# Patient Record
Sex: Female | Born: 1990 | Race: Black or African American | Hispanic: No | Marital: Married | Smoking: Former smoker
Health system: Southern US, Community
[De-identification: ages and names within clinical notes are randomized; demographics above are authoritative.]

## PROBLEM LIST (undated history)

## (undated) DIAGNOSIS — R51 Headache: Secondary | ICD-10-CM

## (undated) DIAGNOSIS — O24419 Gestational diabetes mellitus in pregnancy, unspecified control: Secondary | ICD-10-CM

## (undated) DIAGNOSIS — D6859 Other primary thrombophilia: Secondary | ICD-10-CM

## (undated) DIAGNOSIS — I639 Cerebral infarction, unspecified: Secondary | ICD-10-CM

## (undated) DIAGNOSIS — D649 Anemia, unspecified: Secondary | ICD-10-CM

## (undated) DIAGNOSIS — R7303 Prediabetes: Secondary | ICD-10-CM

## (undated) DIAGNOSIS — E785 Hyperlipidemia, unspecified: Secondary | ICD-10-CM

## (undated) DIAGNOSIS — R519 Headache, unspecified: Secondary | ICD-10-CM

## (undated) DIAGNOSIS — F419 Anxiety disorder, unspecified: Secondary | ICD-10-CM

## (undated) DIAGNOSIS — E669 Obesity, unspecified: Secondary | ICD-10-CM

## (undated) HISTORY — PX: OTHER SURGICAL HISTORY: SHX169

## (undated) HISTORY — DX: Prediabetes: R73.03

## (undated) HISTORY — DX: Gestational diabetes mellitus in pregnancy, unspecified control: O24.419

## (undated) HISTORY — DX: Anxiety disorder, unspecified: F41.9

## (undated) HISTORY — PX: TEE WITHOUT CARDIOVERSION: SHX5443

## (undated) HISTORY — DX: Other primary thrombophilia: D68.59

## (undated) HISTORY — DX: Hyperlipidemia, unspecified: E78.5

---

## 2013-03-03 ENCOUNTER — Encounter (HOSPITAL_COMMUNITY): Payer: Self-pay | Admitting: Emergency Medicine

## 2013-03-03 ENCOUNTER — Emergency Department (HOSPITAL_COMMUNITY)
Admission: EM | Admit: 2013-03-03 | Discharge: 2013-03-03 | Disposition: A | Discharge status: Home / Self Care | Payer: Medicaid Other | Attending: Emergency Medicine | Admitting: Emergency Medicine

## 2013-03-03 DIAGNOSIS — H53149 Visual discomfort, unspecified: Secondary | ICD-10-CM | POA: Insufficient documentation

## 2013-03-03 DIAGNOSIS — Z79899 Other long term (current) drug therapy: Secondary | ICD-10-CM | POA: Insufficient documentation

## 2013-03-03 DIAGNOSIS — R51 Headache: Secondary | ICD-10-CM | POA: Insufficient documentation

## 2013-03-03 MED ORDER — KETOROLAC TROMETHAMINE 30 MG/ML IJ SOLN
30.0000 mg | Freq: Once | INTRAMUSCULAR | Status: AC
  Administered 2013-03-03: 30 mg via INTRAMUSCULAR
  Filled 2013-03-03: qty 1

## 2013-03-03 NOTE — ED Provider Notes (Signed)
CSN: [id number]     Arrival date & time 03/03/13  5621 History   First MD Initiated Contact with Patient 03/03/13 [phone]     Chief Complaint  Patient presents with  . Headache   (Consider location/radiation/quality/duration/timing/severity/associated sxs/prior Treatment) HPI  This is a 22 year old female who presents with headache. Patient reports daily headache for the last 2 weeks. She has a history of allergies and has been taking Claritin, Tylenol, and Motrin without any relief. Patient states that her headache gradually worsens throughout the day. Currently her headache is one out of 10. She states that sometimes she gets blurry vision but no double vision. She has photosensitivity. She denies any nausea or vomiting.  Patient is not on birth control or hormonal medications.  She has a history of migraines but does have history of headaches with her allergies. She's never had a headache last this long. She denies any weakness or numbness.  History reviewed. No pertinent past medical history. History reviewed. No pertinent past surgical history. History reviewed. No pertinent family history. History  Substance Use Topics  . Smoking status: Never Smoker   . Smokeless tobacco: Never Used  . Alcohol Use: No   OB History   Grav Para Term Preterm Abortions TAB SAB Ect Mult Living                 Review of Systems  Constitutional: Negative for fever.  Eyes: Positive for photophobia.  Respiratory: Negative for cough, chest tightness and shortness of breath.   Cardiovascular: Negative for chest pain.  Gastrointestinal: Negative for nausea, vomiting and abdominal pain.  Genitourinary: Negative for dysuria.  Musculoskeletal: Negative for neck pain.  Skin: Negative for wound.  Neurological: Positive for headaches. Negative for dizziness, syncope, weakness and numbness.  Psychiatric/Behavioral: Negative for confusion.  All other systems reviewed and are negative.    Allergies  Review  of patient's allergies indicates no known allergies.  Home Medications   Current Outpatient Rx  Name  Route  Sig  Dispense  Refill  . ALPRAZolam (XANAX) 0.5 MG tablet   Oral   Take 0.5 mg by mouth daily as needed for anxiety.         Marland Kitchen loratadine (CLARITIN) 10 MG tablet   Oral   Take 10 mg by mouth daily.          BP 143/83  Pulse 88  Temp(Src) 97.9 F (36.6 C) (Oral)  Resp 18  Ht 5\' 7"  (1.702 m)  Wt 225 lb (102.059 kg)  BMI 35.23 kg/m2  SpO2 98%  LMP 02/18/2013 Physical Exam  Nursing note and vitals reviewed. Constitutional: She is oriented to person, place, and time. She appears well-developed and well-nourished. No distress.  HENT:  Head: Normocephalic and atraumatic.  Mouth/Throat: Oropharynx is clear and moist.  Eyes: EOM are normal. Pupils are equal, round, and reactive to light.  No evidence of papilledema on nondilated funduscopic exam  Neck: Neck supple.  Cardiovascular: Normal rate, regular rhythm and normal heart sounds.   No murmur heard. Pulmonary/Chest: Effort normal and breath sounds normal. No respiratory distress. She has no wheezes.  Abdominal: Soft. Bowel sounds are normal. There is no tenderness.  Musculoskeletal: She exhibits no edema.  Neurological: She is alert and oriented to person, place, and time. No cranial nerve deficit.  No dysmetria to finger-nose-finger, 4 out of 4 strength in all 4 extremities, visual fields intact  Skin: Skin is warm and dry.  Psychiatric: She has a normal mood and affect.  ED Course  Procedures (including critical care time) Labs Review Labs Reviewed - No data to display Imaging Review No results found.  MDM   1. Headache    This is a 22 year old female who presents with 2 weeks of headache. She is nontoxic-appearing on exam and her vital signs are reassuring. Patient reports 1/10 pain right now but states that usually is worse during the day. Visual acuity and funduscopic exam are reassuring. At this  time I do not feel she needs LP for pseudotumor evaluation. Patient was given IM Toradol with complete resolution of her symptoms. I discussed with the patient that this is likely a migraine variant. However I cannot fully rule out pseudotumor without an LP. She is to develop worsening or refractory headache, acute vision changes, or other concerning symptoms she needs to be reevaluated. Also discussed with her that I thought was unlikely that this was any intracranial abnormality including tumor or bleed. Patient stated understanding. His primary care physician she will followup with.  After history, exam, and medical workup I feel the patient has been appropriately medically screened and is safe for discharge home. Pertinent diagnoses were discussed with the patient. Patient was given return precautions.    Shon Baton, MD 03/03/13 1046

## 2013-03-03 NOTE — ED Notes (Addendum)
Pt states having severe headaches x2 wks. Pt reports have allergies and thought headaches were related but headaches have not improved with taking her normal dose of clariton and tylenol and motrin as needed. Pt states tingling to right arm, blurred vision, and light sensitivity. Pt denies any other symptoms at present time. Pt denies taking any medications today.

## 2014-03-05 ENCOUNTER — Emergency Department (HOSPITAL_COMMUNITY)
Admission: EM | Admit: 2014-03-05 | Discharge: 2014-03-05 | Disposition: A | Discharge status: Home / Self Care | Payer: Medicaid Other | Attending: Emergency Medicine | Admitting: Emergency Medicine

## 2014-03-05 ENCOUNTER — Encounter (HOSPITAL_COMMUNITY): Payer: Self-pay | Admitting: Emergency Medicine

## 2014-03-05 DIAGNOSIS — Z79899 Other long term (current) drug therapy: Secondary | ICD-10-CM | POA: Diagnosis not present

## 2014-03-05 DIAGNOSIS — R51 Headache: Secondary | ICD-10-CM | POA: Diagnosis present

## 2014-03-05 DIAGNOSIS — R110 Nausea: Secondary | ICD-10-CM | POA: Diagnosis not present

## 2014-03-05 DIAGNOSIS — D649 Anemia, unspecified: Secondary | ICD-10-CM | POA: Diagnosis not present

## 2014-03-05 DIAGNOSIS — E669 Obesity, unspecified: Secondary | ICD-10-CM | POA: Diagnosis not present

## 2014-03-05 DIAGNOSIS — R519 Headache, unspecified: Secondary | ICD-10-CM

## 2014-03-05 HISTORY — DX: Headache, unspecified: R51.9

## 2014-03-05 HISTORY — DX: Headache: R51

## 2014-03-05 HISTORY — DX: Obesity, unspecified: E66.9

## 2014-03-05 HISTORY — DX: Anemia, unspecified: D64.9

## 2014-03-05 MED ORDER — KETOROLAC TROMETHAMINE 30 MG/ML IJ SOLN
60.0000 mg | Freq: Once | INTRAMUSCULAR | Status: AC
  Administered 2014-03-05: 60 mg via INTRAMUSCULAR
  Filled 2014-03-05: qty 2

## 2014-03-05 MED ORDER — METOCLOPRAMIDE HCL 5 MG/ML IJ SOLN
10.0000 mg | Freq: Once | INTRAMUSCULAR | Status: AC
  Administered 2014-03-05: 10 mg via INTRAMUSCULAR
  Filled 2014-03-05: qty 2

## 2014-03-05 MED ORDER — DIPHENHYDRAMINE HCL 50 MG/ML IJ SOLN
25.0000 mg | Freq: Once | INTRAMUSCULAR | Status: AC
  Administered 2014-03-05: 25 mg via INTRAMUSCULAR
  Filled 2014-03-05: qty 1

## 2014-03-05 NOTE — Discharge Instructions (Signed)

## 2014-03-05 NOTE — ED Provider Notes (Signed)
Medical screening examination/treatment/procedure(s) were performed by non-physician practitioner and as supervising physician I was immediately available for consultation/collaboration.   EKG Interpretation None        Ward GivensIva L Titiana Severa, MD 03/05/14 1337

## 2014-03-05 NOTE — ED Provider Notes (Signed)
CSN: [id number]     Arrival date & time 03/05/14  1039 History   First MD Initiated Contact with Patient 03/05/14 1109     Chief Complaint  Patient presents with  . Headache    recurrent headache x 1 week  . Nausea     (Consider location/radiation/quality/duration/timing/severity/associated sxs/prior Treatment) HPI Comments: Pt states that she has been having intermittent headache for last week. She states that that she saw her pcp for the symptoms and was told to continue to take ibuprofen. Pt states that she was started on birthcontrol on 2 days ago for irregular bleeding and iron for anemia. Pt states that the ibuprofen helps for a short time and then it starts again. Does have some vision changes when the headache is intense. No vomiting, syncope, neck pain, numbness or weakness. Does have a history of similar symptoms she states that she usually gets injections and symptoms go away  The history is provided by the patient. No language interpreter was used.    Past Medical History  Diagnosis Date  . Anemia   . Headache   . Obesity    History reviewed. No pertinent past surgical history. No family history on file. History  Substance Use Topics  . Smoking status: Never Smoker   . Smokeless tobacco: Never Used  . Alcohol Use: No   OB History   Grav Para Term Preterm Abortions TAB SAB Ect Mult Living                 Review of Systems  Constitutional: Negative.   Respiratory: Negative.   Cardiovascular: Negative.       Allergies  Review of patient's allergies indicates no known allergies.  Home Medications   Prior to Admission medications   Medication Sig Start Date End Date Taking? Authorizing Provider  ferrous sulfate 325 (65 FE) MG tablet Take 325 mg by mouth daily with breakfast.   Yes Historical Provider, MD  ibuprofen (ADVIL,MOTRIN) 200 MG tablet Take 400 mg by mouth every 6 (six) hours as needed for headache.   Yes Historical Provider, MD   norgestimate-ethinyl estradiol (ORTHO-CYCLEN,SPRINTEC,PREVIFEM) 0.25-35 MG-MCG tablet Take 1 tablet by mouth daily.   Yes Historical Provider, MD   BP 133/68  Pulse 100  Temp(Src) 98.4 F (36.9 C) (Oral)  Resp 18  Wt 270 lb (122.471 kg)  SpO2 100%  LMP 02/06/2014 Physical Exam  Nursing note and vitals reviewed. Constitutional: She is oriented to person, place, and time. She appears well-developed and well-nourished.  HENT:  Head: Normocephalic and atraumatic.  Eyes: Conjunctivae and EOM are normal. Pupils are equal, round, and reactive to light.  Neck: Normal range of motion. Neck supple.  Cardiovascular: Normal rate and regular rhythm.   Pulmonary/Chest: Effort normal and breath sounds normal.  Musculoskeletal: Normal range of motion.  Neurological: She is alert and oriented to person, place, and time. She exhibits normal muscle tone. Coordination normal.  No abnormal finger to nose. Grip strength is normal. Ambulatory without any problem  Skin: Skin is warm.    ED Course  Procedures (including critical care time) Labs Review Labs Reviewed - No data to display  Imaging Review No results found.   EKG Interpretation None      MDM   Final diagnoses:  Headache, unspecified headache type    Pt is neurologically intact. Pt is feeling better after medications. Discussed follow up for further symptoms as she is having recurrent episodes and may benefit for chronic medications  Teressa LowerVrinda Irlanda Croghan, NP 03/05/14 1218

## 2014-03-05 NOTE — ED Notes (Signed)
Pt reports intermittent blurred vision, nausea and headache x 1 week. Pain decreased with Motrin. Seen by PCP on Friday. PCP  recommended a complete physical. Pt has been treated multiple times for similar episodes that respond to "injection"

## 2014-03-08 ENCOUNTER — Ambulatory Visit: Payer: Medicaid Other | Admitting: Neurology

## 2014-03-13 ENCOUNTER — Encounter: Payer: Self-pay | Admitting: Neurology

## 2014-03-13 ENCOUNTER — Ambulatory Visit (INDEPENDENT_AMBULATORY_CARE_PROVIDER_SITE_OTHER): Payer: Medicaid Other | Admitting: Neurology

## 2014-03-13 VITALS — BP 129/80 | HR 96 | Ht 68.0 in | Wt 273.8 lb

## 2014-03-13 DIAGNOSIS — G43909 Migraine, unspecified, not intractable, without status migrainosus: Secondary | ICD-10-CM | POA: Insufficient documentation

## 2014-03-13 DIAGNOSIS — G43709 Chronic migraine without aura, not intractable, without status migrainosus: Secondary | ICD-10-CM

## 2014-03-13 DIAGNOSIS — Q142 Congenital malformation of optic disc: Secondary | ICD-10-CM

## 2014-03-13 HISTORY — DX: Morbid (severe) obesity due to excess calories: E66.01

## 2014-03-13 HISTORY — DX: Migraine, unspecified, not intractable, without status migrainosus: G43.909

## 2014-03-13 MED ORDER — TOPIRAMATE ER 100 MG PO CAP24: 100.0000 mg | ORAL_CAPSULE | Freq: Every day | ORAL | Status: DC

## 2014-03-13 MED ORDER — ELETRIPTAN HYDROBROMIDE 40 MG PO TABS: 40.0000 mg | ORAL_TABLET | ORAL | Status: DC | PRN

## 2014-03-13 NOTE — Progress Notes (Signed)
[facility]    Provider:  Dr Lucia GaskinsAhern Referring Provider: No ref. provider found Primary Care Physician:  Altamese CarolinaMARTIN,[name], MD  CC:  Migraine  HPI:  Angel ShoreKhamilah [name] is a 23 y.o. female here as a referral from Dr. No ref. provider found for Migraine  Headaches started a year ago, went to the emergency room and was treated for migraines at that time. Progressing, more severe and more frequent. They are in the right side of the head and eye hurts 10/10 pain like someone is stabbing her and she feels like something is moving in her head. +nausea, light sensitivity, needs to go into a dark room. They last 15-20 minutes and then subside and then has another one, up to 3-4 times a day or more. Severe 10/10, starting to wake her up at night. She went to the ED the other day because she woke up and she was dizzy and head was hurting really bad. lasted at least a few hours. Been to the ED twice. Takes 4 OTC meds every day. Is sexually active, takes birth control pills and not planning on having children. Her vision gets blurry during the headaches, no hearing changes. Not positional. Has gained 40 lbs in the last year. Entire family with headaches.  Review of Systems: Patient complains of symptoms per HPI as well as the following symptoms fatigue, blurred vision, loss of vision, eye pain, anemia, CP, sob, feelin hot, spinning sensation, constipation, cramps, aching muscles, allergies, runny nose, headache, weakness, dizziness, anxiety, not enough sleep, decreased energy, racing thoughts. Pertinent negatives per HPI. All others negative.   History   Social History  . Marital Status: Single    Spouse Name: N/A    Number of Children: 1  . Years of Education: college   Occupational History  .  Other    [employer]   Social History Main Topics  . Smoking status: Never Smoker   . Smokeless tobacco: Never Used  . Alcohol Use: No  . Drug Use: No  . Sexual Activity: Yes   Birth Control/ Protection: Pill   Other Topics Concern  . Not on file   Social History Narrative   Patient lives at home with family.   Caffeine Use: 2 sodas daily    Family History  Problem Relation Age of Onset  . Cancer Paternal Uncle   . Diabetes Paternal Uncle   . Heart disease Maternal Grandmother   . Heart disease Maternal Grandfather   . Diabetes Paternal Grandmother     Past Medical History  Diagnosis Date  . Anemia   . Headache   . Obesity   . Anxiety     Past Surgical History  Procedure Laterality Date  . None      Current Outpatient Prescriptions  Medication Sig Dispense Refill  . ferrous sulfate 325 (65 FE) MG tablet Take 325 mg by mouth daily with breakfast.      . ibuprofen (ADVIL,MOTRIN) 200 MG tablet Take 400 mg by mouth every 6 (six) hours as needed for headache.      . norgestimate-ethinyl estradiol (ORTHO-CYCLEN,SPRINTEC,PREVIFEM) 0.25-35 MG-MCG tablet Take 1 tablet by mouth daily.      Marland Kitchen. eletriptan (RELPAX) 40 MG tablet Take 1 tablet (40 mg total) by mouth as needed for migraine or headache. One tablet by mouth at onset of headache. May repeat in 2 hours if headache persists or recurs.  10 tablet  0  . Topiramate ER (TROKENDI XR) 100 MG  CP24 Take 100 mg by mouth at bedtime.  30 capsule  3  . Topiramate ER (TROKENDI XR) 100 MG CP24 Take 100 mg by mouth at bedtime.  21 capsule  0   No current facility-administered medications for this visit.    Allergies as of 03/13/2014  . (No Known Allergies)    Vitals: BP 129/80  Pulse 96  Ht 5\' 8"  (1.727 m)  Wt 273 lb 12 oz (124.172 kg)  BMI 41.63 kg/m2  LMP 02/06/2014 Last Weight:  Wt Readings from Last 1 Encounters:  03/13/14 273 lb 12 oz (124.172 kg)   Last Height:   Ht Readings from Last 1 Encounters:  03/13/14 5\' 8"  (1.727 m)   Physical exam: Exam: Gen: NAD, conversant, well nourised, morbidly obese, well groomed                     CV: RRR, no MRG. No Carotid Bruits. No peripheral edema,  warm, nontender Eyes: Conjunctivae clear without exudates or hemorrhage  Neuro: Detailed Neurologic Exam  Speech:    Speech is normal; fluent and spontaneous with normal comprehension.  Cognition:    The patient is oriented to person, place, and time;     recent and remote memory intact;     language fluent;     normal attention, concentration,     fund of knowledge Cranial Nerves:    The pupils are equal, round, and reactive to light. The fundi are flat, no papilledema however they are on the smaller side. Visual fields are full to finger confrontation. Extraocular movements are intact. Trigeminal sensation is intact and the muscles of mastication are normal. The face is symmetric. The palate elevates in the midline. Voice is normal. Shoulder shrug is normal. The tongue has normal motion without fasciculations.   Coordination:    Normal finger to nose and heel to shin. Normal rapid alternating movements.   Gait:    Heel-toe and tandem gait are normal.   Motor Observation:    No asymmetry, no atrophy, and no involuntary movements noted. Tone:    Normal muscle tone.    Posture:    Posture is normal. normal erect    Strength:    Strength is V/V in the upper and lower limbs.      Sensation: intact     Reflex Exam:  DTR's:    Deep tendon reflexes in the upper and lower extremities are normal bilaterally.   Toes:    The toes are downgoing bilaterally.   Clonus:    Clonus is absent.       Assessment/Plan:  23 year old morbidly obese patient with new-onset worsening migraines over the last year. Neuro exam normal except for small optic disks, feel better if she had a dilated fundoscopic exam with ophthamologist givin headaches + morbid obesity with a 40 lb weight increase this year. 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 Will start Topamax ER and give relpax to try. Follow up in 3 months. Discussed most common side effects. Also discussed the following:  To prevent or  relieve headaches, try the following: Cool Compress. Lie down and place a cool compress on your head.  Avoid headache triggers. If certain foods or odors seem to have triggered your migraines in the past, avoid them. A headache diary might help you identify triggers.  Include physical activity in your daily routine. Try a daily walk or other moderate aerobic exercise.  Manage stress. Find healthy ways to cope with  the stressors, such as delegating tasks on your to-do list.  Practice relaxation techniques. Try deep breathing, yoga, massage and visualization.  Eat regularly. Eating regularly scheduled meals and maintaining a healthy diet might help prevent headaches. Also, drink plenty of fluids.  Follow a regular sleep schedule. Sleep deprivation might contribute to headaches Consider biofeedback. With this mind-body technique, you learn to control certain bodily functions - such as muscle tension, heart rate and blood pressure - to prevent headaches or reduce headache pain.    Proceed to emergency room if you experience new or worsening symptoms or symptoms do not resolve, if you have new neurologic symptoms or if headache is severe, or for any concerning symptom.    Naomie DeanAntonia Andreya Lacks, MD  [facility] [address] [place], [place] [id number]  Phone [phone] Fax [phone]

## 2014-03-13 NOTE — Patient Instructions (Signed)
Overall you are doing fairly well but I do want to suggest a few things today:   Remember to drink plenty of fluid, eat healthy meals and do not skip any meals. Try to eat protein with a every meal and eat a healthy snack such as fruit or nuts in between meals. Try to keep a regular sleep-wake schedule and try to exercise daily, particularly in the form of walking, 20-30 minutes a day, if you can.   As far as your medications are concerned, I would like to suggest: Topamax ER as directed. At the onset of headache can try relpax with repeat in 2 hours. Relpax can only be taken up to 2 days a week and up to 2x a day.   As far as diagnostic testing: MRi of the brain  I would like to see you back in 3 months, sooner if we need to. Please call us with any interim questions, concerns, problems, updates or refill requests.   Please also call us for any test results so we can go over those with you on the phone.  My clinical assistant and will answer any of your questions and relay your messages to me and also relay most of my messages to you.   Our phone number is [phone]. We also have an after hours call service for urgent matters and there is a physician on-call for urgent questions. For any emergencies you know to call 911 or go to the nearest emergency room  To prevent or relieve headaches, try the following: Cool Compress. Lie down and place a cool compress on your head.  Avoid headache triggers. If certain foods or odors seem to have triggered your migraines in the past, avoid them. A headache diary might help you identify triggers.  Include physical activity in your daily routine. Try a daily walk or other moderate aerobic exercise.  Manage stress. Find healthy ways to cope with the stressors, such as delegating tasks on your to-do list.  Practice relaxation techniques. Try deep breathing, yoga, massage and visualization.  Eat regularly. Eating regularly scheduled meals and maintaining a  healthy diet might help prevent headaches. Also, drink plenty of fluids.  Follow a regular sleep schedule. Sleep deprivation might contribute to headaches Consider biofeedback. With this mind-body technique, you learn to control certain bodily functions - such as muscle tension, heart rate and blood pressure - to prevent headaches or reduce headache pain.    Proceed to emergency room if you experience new or worsening symptoms or symptoms do not resolve, if you have new neurologic symptoms or if headache is severe, or for any concerning symptom.

## 2014-03-20 ENCOUNTER — Inpatient Hospital Stay: Admission: RE | Admit: 2014-03-20 | Payer: Medicaid Other | Source: Ambulatory Visit

## 2014-03-25 ENCOUNTER — Inpatient Hospital Stay: Admission: RE | Admit: 2014-03-25 | Payer: Medicaid Other | Source: Ambulatory Visit

## 2014-03-27 ENCOUNTER — Other Ambulatory Visit: Payer: Medicaid Other

## 2014-03-30 ENCOUNTER — Telehealth: Payer: Self-pay

## 2014-03-30 NOTE — Telephone Encounter (Signed)
Medicaid has notified us they will not approve coverage on Trokendi XR.  They indicate the patient must have a documented trial and failure of at least two or more preferred drugs before they will reconsider coverage.  The preferred list includes: Topirimate, Zonisamide, Gabapentin, Lamotrigine, Divalproex and Levetiracetam.  Would you like to change to a formulary alterative?  Please advise.  Thank you.

## 2014-03-31 ENCOUNTER — Other Ambulatory Visit: Payer: Self-pay | Admitting: Neurology

## 2014-03-31 MED ORDER — TOPIRAMATE 50 MG PO TABS: 50.0000 mg | ORAL_TABLET | Freq: Two times a day (BID) | ORAL | Status: DC

## 2014-03-31 NOTE — Telephone Encounter (Signed)
I will call in Topiramate. Would you let patient know please? Thank you.

## 2014-04-03 NOTE — Telephone Encounter (Signed)
I called back today (Monday).  Got no answer.  Left message.

## 2014-04-04 ENCOUNTER — Emergency Department (HOSPITAL_COMMUNITY)
Admission: EM | Admit: 2014-04-04 | Discharge: 2014-04-04 | Disposition: A | Discharge status: Home / Self Care | Payer: Medicaid Other | Attending: Emergency Medicine | Admitting: Emergency Medicine

## 2014-04-04 ENCOUNTER — Emergency Department (HOSPITAL_COMMUNITY): Payer: Medicaid Other

## 2014-04-04 ENCOUNTER — Encounter (HOSPITAL_COMMUNITY): Payer: Self-pay | Admitting: Emergency Medicine

## 2014-04-04 DIAGNOSIS — E669 Obesity, unspecified: Secondary | ICD-10-CM | POA: Insufficient documentation

## 2014-04-04 DIAGNOSIS — Z79899 Other long term (current) drug therapy: Secondary | ICD-10-CM | POA: Diagnosis not present

## 2014-04-04 DIAGNOSIS — M25552 Pain in left hip: Secondary | ICD-10-CM | POA: Insufficient documentation

## 2014-04-04 DIAGNOSIS — D649 Anemia, unspecified: Secondary | ICD-10-CM | POA: Insufficient documentation

## 2014-04-04 DIAGNOSIS — M79605 Pain in left leg: Secondary | ICD-10-CM | POA: Diagnosis present

## 2014-04-04 DIAGNOSIS — Z8659 Personal history of other mental and behavioral disorders: Secondary | ICD-10-CM | POA: Diagnosis not present

## 2014-04-04 DIAGNOSIS — Z3202 Encounter for pregnancy test, result negative: Secondary | ICD-10-CM | POA: Diagnosis not present

## 2014-04-04 DIAGNOSIS — Z791 Long term (current) use of non-steroidal anti-inflammatories (NSAID): Secondary | ICD-10-CM | POA: Diagnosis not present

## 2014-04-04 LAB — POC URINE PREG, ED: Preg Test, Ur: NEGATIVE

## 2014-04-04 MED ORDER — NAPROXEN 500 MG PO TABS: 500.0000 mg | ORAL_TABLET | Freq: Two times a day (BID) | ORAL | Status: DC

## 2014-04-04 MED ORDER — KETOROLAC TROMETHAMINE 60 MG/2ML IM SOLN
60.0000 mg | Freq: Once | INTRAMUSCULAR | Status: AC
  Administered 2014-04-04: 60 mg via INTRAMUSCULAR
  Filled 2014-04-04: qty 2

## 2014-04-04 NOTE — ED Notes (Signed)
Per pt, states left leg pain since yesterday-no injury

## 2014-04-04 NOTE — ED Provider Notes (Signed)
CSN: [id number]     Arrival date & time 04/04/14  [id number] History   First MD Initiated Contact with Patient 04/04/14 0751     Chief Complaint  Patient presents with  . Leg Pain     (Consider location/radiation/quality/duration/timing/severity/associated sxs/prior Treatment) Patient is a 23 y.o. female presenting with leg pain. The history is provided by the patient. No language interpreter was used.  Leg Pain Location:  Hip Time since incident:  1 day Injury: no   Hip location:  L hip Pain details:    Quality:  Aching   Radiates to: down the left leg.   Severity:  Moderate   Onset quality:  Unable to specify   Duration:  1 day   Timing:  Constant   Progression:  Unchanged Chronicity:  New Dislocation: no   Foreign body present:  No foreign bodies Tetanus status:  Up to date Prior injury to area:  No Relieved by:  Nothing Exacerbated by: laying down, sitting,  Ineffective treatments:  NSAIDs Associated symptoms: no back pain, no decreased ROM, no fatigue, no fever, no itching, no muscle weakness, no neck pain, no numbness, no stiffness, no swelling and no tingling   Risk factors: obesity   Risk factors: no concern for non-accidental trauma, no frequent fractures, no known bone disorder and no recent illness     Past Medical History  Diagnosis Date  . Anemia   . Headache   . Obesity   . Anxiety    Past Surgical History  Procedure Laterality Date  . None     Family History  Problem Relation Age of Onset  . Cancer Paternal Uncle   . Diabetes Paternal Uncle   . Heart disease Maternal Grandmother   . Heart disease Maternal Grandfather   . Diabetes Paternal Grandmother    History  Substance Use Topics  . Smoking status: Never Smoker   . Smokeless tobacco: Never Used  . Alcohol Use: No   OB History    No data available     Review of Systems  Constitutional: Negative for fever, chills, diaphoresis, activity change, appetite change and fatigue.  HENT: Negative  for congestion, facial swelling, rhinorrhea and sore throat.   Eyes: Negative for photophobia and discharge.  Respiratory: Negative for cough, chest tightness and shortness of breath.   Cardiovascular: Negative for chest pain, palpitations and leg swelling.  Gastrointestinal: Negative for nausea, vomiting, abdominal pain and diarrhea.  Endocrine: Negative for polydipsia and polyuria.  Genitourinary: Negative for dysuria, frequency, difficulty urinating and pelvic pain.  Musculoskeletal: Negative for back pain, arthralgias, stiffness, neck pain and neck stiffness.  Skin: Negative for color change, itching and wound.  Allergic/Immunologic: Negative for immunocompromised state.  Neurological: Negative for facial asymmetry, weakness, numbness and headaches.  Hematological: Does not bruise/bleed easily.  Psychiatric/Behavioral: Negative for confusion and agitation.      Allergies  Review of patient's allergies indicates no known allergies.  Home Medications   Prior to Admission medications   Medication Sig Start Date End Date Taking? Authorizing Provider  aspirin-sod bicarb-citric acid (ALKA-SELTZER) 325 MG TBEF tablet Take 325 mg by mouth every 6 (six) hours as needed (for cold).   Yes Historical Provider, MD  ferrous sulfate 325 (65 FE) MG tablet Take 325 mg by mouth 3 (three) times daily with meals.    Yes Historical Provider, MD  ibuprofen (ADVIL,MOTRIN) 200 MG tablet Take 400 mg by mouth every 6 (six) hours as needed for headache or moderate pain.  Yes Historical Provider, MD  norgestimate-ethinyl estradiol (ORTHO-CYCLEN,SPRINTEC,PREVIFEM) 0.25-35 MG-MCG tablet Take 1 tablet by mouth daily.   Yes Historical Provider, MD  eletriptan (RELPAX) 40 MG tablet Take 1 tablet (40 mg total) by mouth as needed for migraine or headache. One tablet by mouth at onset of headache. May repeat in 2 hours if headache persists or recurs. 03/13/14   Anson Fret, MD  naproxen (NAPROSYN) 500 MG tablet  Take 1 tablet (500 mg total) by mouth 2 (two) times daily with a meal. 04/04/14   Toy Cookey, MD  topiramate (TOPAMAX) 50 MG tablet Take 1 tablet (50 mg total) by mouth 2 (two) times daily. 03/31/14   Anson Fret, MD   BP 141/75 mmHg  Pulse 88  Temp(Src) 98 F (36.7 C) (Oral)  Resp 18  SpO2 100%  LMP 02/24/2014 Physical Exam  Constitutional: She is oriented to person, place, and time. She appears well-developed and well-nourished. No distress.  obese  HENT:  Head: Normocephalic and atraumatic.  Mouth/Throat: No oropharyngeal exudate.  Eyes: Pupils are equal, round, and reactive to light.  Neck: Normal range of motion. Neck supple.  Cardiovascular: Normal rate, regular rhythm and normal heart sounds.  Exam reveals no gallop and no friction rub.   No murmur heard. Pulmonary/Chest: Effort normal and breath sounds normal. No respiratory distress. She has no wheezes. She has no rales.  Abdominal: Soft. Bowel sounds are normal. She exhibits no distension and no mass. There is no tenderness. There is no rebound and no guarding.  Musculoskeletal: Normal range of motion. She exhibits no edema.       Left hip: She exhibits tenderness and bony tenderness. She exhibits normal range of motion, normal strength, no swelling, no crepitus, no deformity and no laceration.       Left ankle: She exhibits normal range of motion, no swelling, no deformity and normal pulse.       Left lower leg: She exhibits no swelling, no edema and no deformity.       Legs: Neurological: She is alert and oriented to person, place, and time.  Skin: Skin is warm and dry.  Psychiatric: She has a normal mood and affect.    ED Course  Procedures (including critical care time) Labs Review Labs Reviewed  POC URINE PREG, ED    Imaging Review Dg Hip Complete Left  04/04/2014   CLINICAL DATA:  Generalized pain.  No history of trauma  EXAM: LEFT HIP - COMPLETE 2+ VIEW  COMPARISON:  None.  FINDINGS: Frontal pelvis as  well as frontal and lateral left hip images were obtained. No fracture or dislocation. Joint spaces appear intact. No erosive change.  IMPRESSION: No fracture or dislocation.  No appreciable arthropathy.   Electronically Signed   By: Bretta Bang M.D.   On: 04/04/2014 09:56     EKG Interpretation None      MDM   Final diagnoses:  Left hip pain    Pt is a 23 y.o. female with Pmhx as above who presents with L hip pain since yesterday w/o known injury.she states pain starts at the left lateral hip and radiates down her leg. It is better with standing and worse with sitting or laying down. She has had no appreciable leg swelling. She's had no fever, chills, shortness of breath. On physical exam vital signs are stable and she is in no acute distress. She has tenderness to palpation laterally over the hip joint and also has general tenderness to  palpation of the entirety of the leg however there are no overlying skin changes or abnormality to the leg. She is neurovascularly intact distally and has normal strength and sensation. We'll get an x-ray of the hip after point-of-care pregnancy test.   X-ray of the head normal. Exam and symptoms are not consistent with a DVT. I will discharge her home with instructions for rest, supportive care with twice a day naproxen. She can otherwise follow-up with her PCP      Toy CookeyMegan Janasha Barkalow, MD 04/04/14 1015

## 2014-04-04 NOTE — Discharge Instructions (Signed)

## 2014-05-01 ENCOUNTER — Emergency Department (HOSPITAL_COMMUNITY)
Admission: EM | Admit: 2014-05-01 | Discharge: 2014-05-01 | Disposition: A | Discharge status: Home / Self Care | Payer: Medicaid Other | Attending: Emergency Medicine | Admitting: Emergency Medicine

## 2014-05-01 ENCOUNTER — Encounter (HOSPITAL_COMMUNITY): Payer: Self-pay | Admitting: Emergency Medicine

## 2014-05-01 DIAGNOSIS — Z79899 Other long term (current) drug therapy: Secondary | ICD-10-CM | POA: Insufficient documentation

## 2014-05-01 DIAGNOSIS — E669 Obesity, unspecified: Secondary | ICD-10-CM | POA: Insufficient documentation

## 2014-05-01 DIAGNOSIS — D649 Anemia, unspecified: Secondary | ICD-10-CM | POA: Insufficient documentation

## 2014-05-01 DIAGNOSIS — G43909 Migraine, unspecified, not intractable, without status migrainosus: Secondary | ICD-10-CM | POA: Diagnosis present

## 2014-05-01 DIAGNOSIS — Z8659 Personal history of other mental and behavioral disorders: Secondary | ICD-10-CM | POA: Insufficient documentation

## 2014-05-01 DIAGNOSIS — Z793 Long term (current) use of hormonal contraceptives: Secondary | ICD-10-CM | POA: Diagnosis not present

## 2014-05-01 LAB — CBG MONITORING, ED: GLUCOSE-CAPILLARY: 89 mg/dL (ref 70–99)

## 2014-05-01 MED ORDER — NAPROXEN 500 MG PO TABS: 500.0000 mg | ORAL_TABLET | Freq: Two times a day (BID) | ORAL | Status: DC

## 2014-05-01 MED ORDER — SODIUM CHLORIDE 0.9 % IV BOLUS (SEPSIS)
1000.0000 mL | Freq: Once | INTRAVENOUS | Status: AC
  Administered 2014-05-01: 1000 mL via INTRAVENOUS

## 2014-05-01 MED ORDER — ONDANSETRON 4 MG PO TBDP: 4.0000 mg | ORAL_TABLET | Freq: Three times a day (TID) | ORAL | Status: DC | PRN

## 2014-05-01 MED ORDER — DIPHENHYDRAMINE HCL 50 MG/ML IJ SOLN
25.0000 mg | Freq: Once | INTRAMUSCULAR | Status: AC
  Administered 2014-05-01: 25 mg via INTRAVENOUS
  Filled 2014-05-01: qty 1

## 2014-05-01 MED ORDER — PROCHLORPERAZINE EDISYLATE 5 MG/ML IJ SOLN
10.0000 mg | Freq: Once | INTRAMUSCULAR | Status: AC
  Administered 2014-05-01: 10 mg via INTRAVENOUS
  Filled 2014-05-01: qty 2

## 2014-05-01 MED ORDER — KETOROLAC TROMETHAMINE 30 MG/ML IJ SOLN
30.0000 mg | Freq: Once | INTRAMUSCULAR | Status: AC
  Administered 2014-05-01: 30 mg via INTRAVENOUS
  Filled 2014-05-01: qty 1

## 2014-05-01 NOTE — Discharge Instructions (Signed)

## 2014-05-01 NOTE — ED Notes (Signed)
Pt with Hx of migraines c/o right sided headache with blurred vision on left eye with narrowing field of vision in left eye onset today at 1100. Pt states that these symptoms do typically occur with her migraines but that this episode is not resolving. Left eye distance vision 20/200. Pt denies usually having difficulty seeing out of that eye when not having a migraine.

## 2014-05-01 NOTE — ED Provider Notes (Signed)
CSN: [id number]     Arrival date & time 05/01/14  1600 History   First MD Initiated Contact with Patient 05/01/14 1709     Chief Complaint  Patient presents with  . Migraine  . Blurred Vision   Angel [name] is a 23 y.o. female with history of migraines who presents to the ED complaining of a right-sided headache for the past 6 hours today associated with blurry vision in her left eye. Patient rates her headache at a 9 out of 10 and is associated with photophobia and some phonophobia. Patient reports this feels similar to her previous migraines are typically her blurry vision is transient and today her blurry vision is persistent. Patient also reports some associated nausea but no vomiting.The patient reports taking ibuprofen at 12:30 today without relief. Patient Angel [name] is no longer taking a triptan or Topamax. Patient reports her headache started in her occipital region on her right side and now is her right frontal area. Patient denies fevers, chills, dizziness, difficulty walking, chest pain, cough, shortness of breath, abdominal pain, vomiting, neck pain, or back pain.  (Consider location/radiation/quality/duration/timing/severity/associated sxs/prior Treatment) HPI  Past Medical History  Diagnosis Date  . Anemia   . Headache   . Obesity   . Anxiety    Past Surgical History  Procedure Laterality Date  . None     Family History  Problem Relation Age of Onset  . Cancer Paternal Uncle   . Diabetes Paternal Uncle   . Heart disease Maternal Grandmother   . Heart disease Maternal Grandfather   . Diabetes Paternal Grandmother    History  Substance Use Topics  . Smoking status: Never Smoker   . Smokeless tobacco: Never Used  . Alcohol Use: No   OB History    No data available     Review of Systems  Constitutional: Negative for fever and chills.  HENT: Negative for congestion, ear pain, facial swelling, hearing loss, rhinorrhea, sore throat and trouble swallowing.   Eyes:  Positive for photophobia and visual disturbance. Negative for pain, discharge, redness and itching.  Respiratory: Negative for cough, shortness of breath and wheezing.   Cardiovascular: Negative for chest pain and palpitations.  Gastrointestinal: Positive for nausea. Negative for vomiting, abdominal pain and diarrhea.  Genitourinary: Negative for dysuria and difficulty urinating.  Musculoskeletal: Negative for back pain and neck pain.  Skin: Negative for rash and wound.  Neurological: Positive for headaches. Negative for dizziness, tremors, seizures, speech difficulty, weakness and numbness.  All other systems reviewed and are negative.     Allergies  Review of patient's allergies indicates no known allergies.  Home Medications   Prior to Admission medications   Medication Sig Start Date End Date Taking? Authorizing Provider  ferrous sulfate 325 (65 FE) MG tablet Take 325 mg by mouth 3 (three) times daily with meals.    Yes Historical Provider, MD  ibuprofen (ADVIL,MOTRIN) 200 MG tablet Take 200 mg by mouth every 6 (six) hours as needed for headache or moderate pain (pain).    Yes Historical Provider, MD  norgestimate-ethinyl estradiol (ORTHO-CYCLEN,SPRINTEC,PREVIFEM) 0.25-35 MG-MCG tablet Take 1 tablet by mouth daily.   Yes Historical Provider, MD  aspirin-sod bicarb-citric acid (ALKA-SELTZER) 325 MG TBEF tablet Take 325 mg by mouth every 6 (six) hours as needed (for cold).    Historical Provider, MD  eletriptan (RELPAX) 40 MG tablet Take 1 tablet (40 mg total) by mouth as needed for migraine or headache. One tablet by mouth at onset of headache. May  repeat in 2 hours if headache persists or recurs. Patient not taking: Reported on 05/01/2014 03/13/14   Anson FretAntonia B Ahern, MD  naproxen (NAPROSYN) 500 MG tablet Take 1 tablet (500 mg total) by mouth 2 (two) times daily with a meal. 05/01/14   Einar GipWilliam Duncan Joi Leyva, PA-C  ondansetron (ZOFRAN ODT) 4 MG disintegrating tablet Take 1 tablet (4 mg  total) by mouth every 8 (eight) hours as needed for nausea or vomiting. 05/01/14   Einar GipWilliam Duncan Vianna Venezia, PA-C  topiramate (TOPAMAX) 50 MG tablet Take 1 tablet (50 mg total) by mouth 2 (two) times daily. Patient not taking: Reported on 05/01/2014 03/31/14   Anson FretAntonia B Ahern, MD   BP 123/60 mmHg  Pulse 83  Temp(Src) 97.8 F (36.6 C) (Oral)  Resp 18  SpO2 100%  LMP 02/24/2014 Physical Exam  Constitutional: She is oriented to person, place, and time. She appears well-developed and well-nourished. No distress.  HENT:  Head: Normocephalic and atraumatic.  Right Ear: External ear normal.  Left Ear: External ear normal.  Nose: Nose normal.  Mouth/Throat: Oropharynx is clear and moist. No oropharyngeal exudate.  Bilateral tympanic membranes are pearly-gray without erythema or loss of landmarks. No temporal pain.   Eyes: Conjunctivae and EOM are normal. Pupils are equal, round, and reactive to light. Right eye exhibits no discharge. Left eye exhibits no discharge.  Initially the patient had difficulty with vision out of her left eye but this improved with her headache improvement.   Neck: Normal range of motion. Neck supple.  Cardiovascular: Normal rate, regular rhythm, normal heart sounds and intact distal pulses.  Exam reveals no gallop and no friction rub.   No murmur heard. Pulmonary/Chest: Effort normal and breath sounds normal. No respiratory distress. She has no wheezes. She has no rales.  Abdominal: Soft. She exhibits no distension. There is no tenderness.  Musculoskeletal: She exhibits no edema.  Patient's strength is 5 out of 5 in her bilateral upper and lower extremity.  Lymphadenopathy:    She has no cervical adenopathy.  Neurological: She is alert and oriented to person, place, and time. No cranial nerve deficit. Coordination normal.  Patient's cranial nerves 2 through 12 are intact bilaterally. Patient's finger to nose intact bilaterally. No pronator drift. Sensation in her face is  intact bilaterally. Patient is able to ambulate in the room without difficulty or assistance. Patient's strength is 5 out of 5 in her bilateral upper and lower extremities.  Skin: Skin is warm and dry. No rash noted. She is not diaphoretic. No erythema. No pallor.  Psychiatric: She has a normal mood and affect. Her behavior is normal.  Nursing note and vitals reviewed.   ED Course  Procedures (including critical care time) Labs Review Labs Reviewed  CBG MONITORING, ED    Imaging Review No results found.   EKG Interpretation None      Filed Vitals:   05/01/14 1607 05/01/14 1756 05/01/14 1926  BP: 128/57 127/67 123/60  Pulse: 89 76 83  Temp: 97.8 F (36.6 C)    TempSrc: Oral    Resp: 18 18 18   SpO2: 100% 100% 100%     MDM   Meds given in ED:  Medications  ketorolac (TORADOL) 30 MG/ML injection 30 mg (30 mg Intravenous Given 05/01/14 1806)  prochlorperazine (COMPAZINE) injection 10 mg (10 mg Intravenous Given 05/01/14 1806)  diphenhydrAMINE (BENADRYL) injection 25 mg (25 mg Intravenous Given 05/01/14 1806)  sodium chloride 0.9 % bolus 1,000 mL (0 mLs Intravenous Stopped 05/01/14 1854)  Discharge Medication List as of 05/01/2014  7:17 PM    START taking these medications   Details  ondansetron (ZOFRAN ODT) 4 MG disintegrating tablet Take 1 tablet (4 mg total) by mouth every 8 (eight) hours as needed for nausea or vomiting., Starting 05/01/2014, Until Discontinued, Print        Final diagnoses:  Migraine without status migrainosus, not intractable, unspecified migraine type   Georgiana ShoreKhamilah Allen is a 23 y.o. female with history of migraines who presents to the ED complaining of a right-sided headache for the past 6 hours today associated with blurry vision in her left eye. Patient reports she's had blurry vision or left eye with previous migraines but this blurry vision has persisted with this migraine. Patient is afebrile and nontoxic appearing. Patient's vitals are within  normal limits. Patient's neurologic exam is unremarkable. The patient's blurry vision resolved with headache resolution. The patient's headache resolved with Toradol, Compazine, Benadryl and fluids. At reevaluation she reports her headache has resolved. We'll discharge this patient with Zofran and naproxen for home. Advised patient she needs to follow-up with her primary care provider this week. Advised patient return to the emergency department with new or worsening symptoms or new concerns. Patient verbalized understanding and agreement with plan.  This patient was discussed with Dr. Patria Maneampos who agrees with assessment and plan.    Lawana ChambersWilliam Duncan [place] Welcome, PA-C 05/02/14 0202  Lyanne CoKevin M Campos, MD 05/02/14 [phone]

## 2014-05-10 ENCOUNTER — Ambulatory Visit
Admission: RE | Admit: 2014-05-10 | Discharge: 2014-05-10 | Disposition: A | Discharge status: Home / Self Care | Payer: Medicaid Other | Source: Ambulatory Visit | Attending: Neurology | Admitting: Neurology

## 2014-05-10 ENCOUNTER — Encounter (INDEPENDENT_AMBULATORY_CARE_PROVIDER_SITE_OTHER): Payer: Medicaid Other | Admitting: Diagnostic Neuroimaging

## 2014-05-10 DIAGNOSIS — G43709 Chronic migraine without aura, not intractable, without status migrainosus: Secondary | ICD-10-CM

## 2014-05-12 ENCOUNTER — Telehealth: Payer: Self-pay | Admitting: Neurology

## 2014-05-12 MED ORDER — ATORVASTATIN CALCIUM 10 MG PO TABS: 10.0000 mg | ORAL_TABLET | Freq: Every day | ORAL | Status: DC

## 2014-05-12 NOTE — Telephone Encounter (Signed)
Patient will come in to office on Monday to review images in person. She will start baby aspirin and Lipitor 10mg  daily in the mean time. 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

## 2014-05-13 ENCOUNTER — Inpatient Hospital Stay (HOSPITAL_COMMUNITY)
Admission: EM | Admit: 2014-05-13 | Discharge: 2014-05-17 | DRG: 065 | Disposition: A | Discharge status: Home / Self Care | Payer: Medicaid Other | Attending: Internal Medicine | Admitting: Internal Medicine

## 2014-05-13 ENCOUNTER — Inpatient Hospital Stay (HOSPITAL_COMMUNITY): Payer: Medicaid Other

## 2014-05-13 ENCOUNTER — Emergency Department (HOSPITAL_COMMUNITY): Payer: Medicaid Other

## 2014-05-13 ENCOUNTER — Encounter (HOSPITAL_COMMUNITY): Payer: Self-pay | Admitting: Emergency Medicine

## 2014-05-13 DIAGNOSIS — I6523 Occlusion and stenosis of bilateral carotid arteries: Secondary | ICD-10-CM | POA: Diagnosis present

## 2014-05-13 DIAGNOSIS — D6859 Other primary thrombophilia: Secondary | ICD-10-CM | POA: Diagnosis present

## 2014-05-13 DIAGNOSIS — H538 Other visual disturbances: Secondary | ICD-10-CM | POA: Diagnosis present

## 2014-05-13 DIAGNOSIS — I639 Cerebral infarction, unspecified: Secondary | ICD-10-CM | POA: Diagnosis present

## 2014-05-13 DIAGNOSIS — I6350 Cerebral infarction due to unspecified occlusion or stenosis of unspecified cerebral artery: Secondary | ICD-10-CM

## 2014-05-13 DIAGNOSIS — R208 Other disturbances of skin sensation: Secondary | ICD-10-CM | POA: Diagnosis present

## 2014-05-13 DIAGNOSIS — G43909 Migraine, unspecified, not intractable, without status migrainosus: Secondary | ICD-10-CM | POA: Diagnosis present

## 2014-05-13 DIAGNOSIS — G43709 Chronic migraine without aura, not intractable, without status migrainosus: Secondary | ICD-10-CM | POA: Diagnosis present

## 2014-05-13 DIAGNOSIS — Z6838 Body mass index (BMI) 38.0-38.9, adult: Secondary | ICD-10-CM

## 2014-05-13 DIAGNOSIS — F419 Anxiety disorder, unspecified: Secondary | ICD-10-CM | POA: Diagnosis present

## 2014-05-13 DIAGNOSIS — Z79899 Other long term (current) drug therapy: Secondary | ICD-10-CM

## 2014-05-13 DIAGNOSIS — Z832 Family history of diseases of the blood and blood-forming organs and certain disorders involving the immune mechanism: Secondary | ICD-10-CM | POA: Diagnosis not present

## 2014-05-13 DIAGNOSIS — E785 Hyperlipidemia, unspecified: Secondary | ICD-10-CM | POA: Diagnosis present

## 2014-05-13 DIAGNOSIS — Z8673 Personal history of transient ischemic attack (TIA), and cerebral infarction without residual deficits: Secondary | ICD-10-CM | POA: Diagnosis present

## 2014-05-13 DIAGNOSIS — D649 Anemia, unspecified: Secondary | ICD-10-CM | POA: Diagnosis present

## 2014-05-13 DIAGNOSIS — Z8249 Family history of ischemic heart disease and other diseases of the circulatory system: Secondary | ICD-10-CM

## 2014-05-13 DIAGNOSIS — D509 Iron deficiency anemia, unspecified: Secondary | ICD-10-CM | POA: Diagnosis present

## 2014-05-13 DIAGNOSIS — H5347 Heteronymous bilateral field defects: Secondary | ICD-10-CM | POA: Diagnosis present

## 2014-05-13 DIAGNOSIS — Z791 Long term (current) use of non-steroidal anti-inflammatories (NSAID): Secondary | ICD-10-CM | POA: Diagnosis not present

## 2014-05-13 DIAGNOSIS — I63531 Cerebral infarction due to unspecified occlusion or stenosis of right posterior cerebral artery: Secondary | ICD-10-CM | POA: Diagnosis present

## 2014-05-13 DIAGNOSIS — Z833 Family history of diabetes mellitus: Secondary | ICD-10-CM

## 2014-05-13 DIAGNOSIS — Z7982 Long term (current) use of aspirin: Secondary | ICD-10-CM | POA: Diagnosis not present

## 2014-05-13 DIAGNOSIS — G43009 Migraine without aura, not intractable, without status migrainosus: Secondary | ICD-10-CM

## 2014-05-13 DIAGNOSIS — R200 Anesthesia of skin: Secondary | ICD-10-CM | POA: Insufficient documentation

## 2014-05-13 DIAGNOSIS — R202 Paresthesia of skin: Secondary | ICD-10-CM

## 2014-05-13 HISTORY — DX: Cerebral infarction, unspecified: I63.9

## 2014-05-13 LAB — TROPONIN I

## 2014-05-13 LAB — LIPID PANEL
CHOL/HDL RATIO: 2.6 ratio
CHOLESTEROL: 142 mg/dL (ref 0–200)
HDL: 55 mg/dL (ref >39)
LDL Cholesterol: 72 mg/dL (ref 0–99)
Triglycerides: 77 mg/dL (ref <150)
VLDL: 15 mg/dL (ref 0–40)

## 2014-05-13 LAB — URINALYSIS, ROUTINE W REFLEX MICROSCOPIC
BILIRUBIN URINE: NEGATIVE
GLUCOSE, UA: 100 mg/dL — AB
HGB URINE DIPSTICK: NEGATIVE
KETONES UR: NEGATIVE mg/dL
Leukocytes, UA: NEGATIVE
Nitrite: NEGATIVE
PH: 5.5 (ref 5.0–8.0)
PROTEIN: NEGATIVE mg/dL
Specific Gravity, Urine: 1.03 (ref 1.005–1.030)
Urobilinogen, UA: 0.2 mg/dL (ref 0.0–1.0)

## 2014-05-13 LAB — COMPREHENSIVE METABOLIC PANEL
ALK PHOS: 79 U/L (ref 39–117)
ALT: 35 U/L (ref 0–35)
AST: 27 U/L (ref 0–37)
Albumin: 3.2 g/dL — ABNORMAL LOW (ref 3.5–5.2)
Anion gap: 14 (ref 5–15)
BUN: 12 mg/dL (ref 6–23)
CHLORIDE: 100 meq/L (ref 96–112)
CO2: 22 mEq/L (ref 19–32)
CREATININE: 0.76 mg/dL (ref 0.50–1.10)
Calcium: 9.5 mg/dL (ref 8.4–10.5)
GFR calc Af Amer: >90 mL/min (ref >90)
GFR calc non Af Amer: >90 mL/min (ref >90)
Glucose, Bld: 129 mg/dL — ABNORMAL HIGH (ref 70–99)
Potassium: 3.9 mEq/L (ref 3.7–5.3)
Sodium: 136 mEq/L — ABNORMAL LOW (ref 137–147)
Total Protein: 8.2 g/dL (ref 6.0–8.3)

## 2014-05-13 LAB — I-STAT CHEM 8, ED
BUN: 14 mg/dL (ref 6–23)
Calcium, Ion: 1.12 mmol/L (ref 1.12–1.23)
Chloride: 108 mEq/L (ref 96–112)
Creatinine, Ser: 0.6 mg/dL (ref 0.50–1.10)
Glucose, Bld: 130 mg/dL — ABNORMAL HIGH (ref 70–99)
HCT: 34 % — ABNORMAL LOW (ref 36.0–46.0)
HEMOGLOBIN: 11.6 g/dL — AB (ref 12.0–15.0)
Potassium: 4.3 mEq/L (ref 3.7–5.3)
SODIUM: 138 meq/L (ref 137–147)
TCO2: 19 mmol/L (ref 0–100)

## 2014-05-13 LAB — CBC WITH DIFFERENTIAL/PLATELET
BASOS ABS: 0 10*3/uL (ref 0.0–0.1)
Basophils Relative: 0 % (ref 0–1)
Eosinophils Absolute: 0 10*3/uL (ref 0.0–0.7)
Eosinophils Relative: 0 % (ref 0–5)
HCT: 32.1 % — ABNORMAL LOW (ref 36.0–46.0)
Hemoglobin: 9 g/dL — ABNORMAL LOW (ref 12.0–15.0)
LYMPHS PCT: 20 % (ref 12–46)
Lymphs Abs: 1.7 10*3/uL (ref 0.7–4.0)
MCH: 20.5 pg — AB (ref 26.0–34.0)
MCHC: 28 g/dL — AB (ref 30.0–36.0)
MCV: 73.3 fL — ABNORMAL LOW (ref 78.0–100.0)
MONO ABS: 0.5 10*3/uL (ref 0.1–1.0)
Monocytes Relative: 6 % (ref 3–12)
Neutro Abs: 6.4 10*3/uL (ref 1.7–7.7)
Neutrophils Relative %: 74 % (ref 43–77)
PLATELETS: 435 10*3/uL — AB (ref 150–400)
RBC: 4.38 MIL/uL (ref 3.87–5.11)
RDW: 20.2 % — AB (ref 11.5–15.5)
WBC: 8.6 10*3/uL (ref 4.0–10.5)

## 2014-05-13 LAB — HCG, QUANTITATIVE, PREGNANCY: hCG, Beta Chain, Quant, S: <1 m[IU]/mL (ref <5)

## 2014-05-13 LAB — RAPID URINE DRUG SCREEN, HOSP PERFORMED
AMPHETAMINES: NOT DETECTED
BARBITURATES: NOT DETECTED
BENZODIAZEPINES: NOT DETECTED
Cocaine: NOT DETECTED
Opiates: NOT DETECTED
TETRAHYDROCANNABINOL: NOT DETECTED

## 2014-05-13 LAB — HEMOGLOBIN A1C
Hgb A1c MFr Bld: 6.2 % — ABNORMAL HIGH (ref <5.7)
Mean Plasma Glucose: 131 mg/dL — ABNORMAL HIGH (ref <117)

## 2014-05-13 LAB — PROTIME-INR
INR: 1.07 (ref 0.00–1.49)
Prothrombin Time: 14 seconds (ref 11.6–15.2)

## 2014-05-13 LAB — C-REACTIVE PROTEIN: CRP: 3.6 mg/dL — AB (ref <0.60)

## 2014-05-13 LAB — APTT: APTT: 27 s (ref 24–37)

## 2014-05-13 LAB — SEDIMENTATION RATE: Sed Rate: 25 mm/hr — ABNORMAL HIGH (ref 0–22)

## 2014-05-13 MED ORDER — ATORVASTATIN CALCIUM 10 MG PO TABS
10.0000 mg | ORAL_TABLET | Freq: Every day | ORAL | Status: DC
  Administered 2014-05-13: 10 mg via ORAL
  Filled 2014-05-13 (×2): qty 1

## 2014-05-13 MED ORDER — INFLUENZA VAC SPLIT QUAD 0.5 ML IM SUSY
0.5000 mL | PREFILLED_SYRINGE | INTRAMUSCULAR | Status: AC
  Administered 2014-05-14: 0.5 mL via INTRAMUSCULAR
  Filled 2014-05-13: qty 0.5

## 2014-05-13 MED ORDER — SALINE SPRAY 0.65 % NA SOLN
1.0000 | Freq: Every day | NASAL | Status: DC | PRN
  Filled 2014-05-13: qty 44

## 2014-05-13 MED ORDER — HEPARIN SODIUM (PORCINE) 5000 UNIT/ML IJ SOLN
5000.0000 [IU] | Freq: Three times a day (TID) | INTRAMUSCULAR | Status: DC
  Administered 2014-05-13 – 2014-05-15 (×6): 5000 [IU] via SUBCUTANEOUS
  Filled 2014-05-13 (×6): qty 1

## 2014-05-13 MED ORDER — STROKE: EARLY STAGES OF RECOVERY BOOK
Freq: Once | Status: DC
  Filled 2014-05-13: qty 1

## 2014-05-13 MED ORDER — ONDANSETRON 4 MG PO TBDP
4.0000 mg | ORAL_TABLET | Freq: Three times a day (TID) | ORAL | Status: DC | PRN
  Administered 2014-05-13: 4 mg via ORAL
  Filled 2014-05-13: qty 1

## 2014-05-13 MED ORDER — ASPIRIN 300 MG RE SUPP: 300.0000 mg | Freq: Every day | RECTAL | Status: DC

## 2014-05-13 MED ORDER — SENNOSIDES-DOCUSATE SODIUM 8.6-50 MG PO TABS: 1.0000 | ORAL_TABLET | Freq: Every evening | ORAL | Status: DC | PRN

## 2014-05-13 MED ORDER — NITROGLYCERIN 0.4 MG SL SUBL
0.4000 mg | SUBLINGUAL_TABLET | SUBLINGUAL | Status: DC | PRN
  Administered 2014-05-14: 0.4 mg via SUBLINGUAL
  Filled 2014-05-13: qty 1

## 2014-05-13 MED ORDER — ASPIRIN 325 MG PO TABS
325.0000 mg | ORAL_TABLET | Freq: Every day | ORAL | Status: DC
  Administered 2014-05-13 – 2014-05-17 (×5): 325 mg via ORAL
  Filled 2014-05-13 (×5): qty 1

## 2014-05-13 NOTE — Progress Notes (Signed)
STROKE TEAM PROGRESS NOTE   HISTORY Georgiana ShoreKhamilah Allen is an 23 y.o. female with a past medical history significant for episodic migraine, obesity, and anemia, [facility] for further evaluation of new onset left-sided numbness and abnormal MRI. She indicated that she saw her neurologist couple of weeks ago due to worsening migraines and brain MRI was requested was completed 2 days ago and she was called last and informed that the MRI was positive for stroke, and asked to come to the ED if new symptoms developed. Patient indicated that last night she had acute onset of numbness involving left face-arm-leg and she presented to [facility]ED. Complains of a mild HA but denies vertigo, double vision, difficulty swallowing, imbalance, focal weakness, slurred speech, language difficulty, or bladder impairment. York SpanielSaid that couple weeks ago she had a transient episode of painless visual loss left eye and still having blurred vision.  MRI brain 12/16: Right occipital lobe sulcal T2FLAIR hyperintensity without associated hemorrhage on SWI views. 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 Elsewhere there are a few scattered punctate subcortical T2 hyperintensities in the centrum semiovale. Main considerations would include a variety cerebral vasoconstriction syndromes, some of which may be reversible and others which may be progressive/permanent. These may include vasculitidies (primary CNS angitis, giant cell arteritis, granulomatous disease, other systemic rheumatologic diseases), hypercoagulable states (autoimmune or paraneoplastic), infectious processes (meningo-encephalitis), subarachnoid hemorrhage, as well a group of conditions associated with so called reversible cerebral vasoconstriction syndrome (RCVS).   Date last known well: 05/12/14 Time last known well: uncertain tPA Given: no, out of the  window   SUBJECTIVE (INTERVAL HISTORY) No family members present. The patient feels she is improving. She reports that there is a family history of coagulopathy with multiple members of her family on anticoagulation including her mother.   OBJECTIVE Temp:  [97.5 F (36.4 C)-98.8 F (37.1 C)] 98.4 F (36.9 C) (12/19 1200) Pulse Rate:  [74-106] 85 (12/19 1200) Cardiac Rhythm:  [-] Normal sinus rhythm;Sinus tachycardia (12/19 0900) Resp:  [14-28] 20 (12/19 1200) BP: (120-155)/(60-82) 130/74 mmHg (12/19 1200) SpO2:  [98 %-100 %] 100 % (12/19 1200) Weight:  [270 lb (122.471 kg)] 270 lb (122.471 kg) (12/19 0116)  No results for input(s): GLUCAP in the last 168 hours.  Recent Labs Lab 05/13/14 0233 05/13/14 0339  NA 138 136*  K 4.3 3.9  CL 108 100  CO2  --  22  GLUCOSE 130* 129*  BUN 14 12  CREATININE 0.60 0.76  CALCIUM  --  9.5    Recent Labs Lab 05/13/14 0339  AST 27  ALT 35  ALKPHOS 79  BILITOT <0.2*  PROT 8.2  ALBUMIN 3.2*    Recent Labs Lab 05/13/14 0220 05/13/14 0233  WBC 8.6  --   NEUTROABS 6.4  --   HGB 9.0* 11.6*  HCT 32.1* 34.0*  MCV 73.3*  --   PLT 435*  --     Recent Labs Lab 05/13/14 0820  TROPONINI <0.30    Recent Labs  05/13/14 0339  LABPROT 14.0  INR 1.07    Recent Labs  05/13/14 0453  COLORURINE YELLOW  LABSPEC 1.030  PHURINE 5.5  GLUCOSEU 100*  HGBUR NEGATIVE  BILIRUBINUR NEGATIVE  KETONESUR NEGATIVE  PROTEINUR NEGATIVE  UROBILINOGEN 0.2  NITRITE NEGATIVE  LEUKOCYTESUR NEGATIVE       Component Value Date/Time   CHOL 142 05/13/2014 0820   TRIG 77 05/13/2014  0820   HDL 55 05/13/2014 0820   CHOLHDL 2.6 05/13/2014 0820   VLDL 15 05/13/2014 0820   LDLCALC 72 05/13/2014 0820   No results found for: HGBA1C No results found for: LABOPIA, COCAINSCRNUR, LABBENZ, AMPHETMU, THCU, LABBARB  No results for input(s): ETH in the last 168 hours.  Ct Head Wo Contrast 05/13/2014    No acute intracranial process. Recently  described small infarcts not visualized on this noncontrast head CT.     MRI / MRA Brain Wo Contrast 1. Stable abnormal signal in the right occipital lobe and at the right splenium of the corpus callosum since 05/10/2014. Some of the abnormal signal seems confined to the right occipital subarachnoid space as before. 2. However, there is distal right PCA occlusion (right P3 segments).  3. 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  4. Nonetheless, CSF analysis still may be valuable to confirm No abnormal subarachnoid process.  5. No other MRI or intracranial MRA abnormality identified.       PHYSICAL EXAM  Mental Status: Alert, oriented, thought content appropriate. Speech fluent without evidence of aphasia. Able to follow 3 step commands without difficulty. Cranial Nerves: II: Pupils equal, round, reactive to light and accommodation. Left homonymous hemianopsia III,IV, VI: ptosis not present, extra-ocular motions intact bilaterally V,VII: smile symmetric, facial light touch sensation normal bilaterally VIII: hearing normal bilaterally IX,X: gag reflex present XI: bilateral shoulder shrug XII: midline tongue extension without atrophy or fasciculations Motor:  Strength 5/5 all extremities. Sensory: Intact to light touch Cerebellar: normal finger-to-nose Gait:  Not tested   ASSESSMENT/PLAN Ms. Georgiana ShoreKhamilah Allen is a 23 y.o. female with history of migraine headaches, obesity anemia and family history of coagulopathy,  presenting with left-sided numbness . She did not receive IV t-PA due to minimal deficits and late presentation.   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  Resultant - resolving left-sided numbness   MRI  as above   MRA  as above   Carotid Doppler pending  2D Echo  pending  LDL 72   HgbA1c pending  Subcutaneous heparin for VTE prophylaxis  Diet regular  with  thin liquids  no antithrombotic prior to admission, now on aspirin 325 mg orally every day  Patient counseled to be compliant with her antithrombotic medications  Ongoing aggressive stroke risk factor management  Therapy recommendations:  Pending  Disposition:  Pending    Hyperlipidemia  Home meds:  Lipitor 10 mg daily resumed in hospital  LDL 72, goal < 70  Consider increasing Lipitor to 20 mg daily  Continue statin at discharge  Diabetes  HgbA1c pending goal < 7.0  Controlled  Other Stroke Risk Factors   Obesity, Body mass index is 38.74 kg/(m^2).   Migraines  Birth control pills prior to admission  Family history of coagulopathy - will order hypercoagulable panel per Dr. Loretha BrasilZeylikman.  RPR, HIV antibody, C3, C4, CH 50, ANA, and C-reactive protein - already ordered and pending.  Other Active Problems  Anemia   Elevated sedimentation rate - 25  Other Pertinent History  Anxiety   Hospital day # 0  Delton SeeDavid [facility] Pager [phone] 05/13/2014, 1:40 PM   Hypercoagulable sign family hx of hypercoagulable d/o.  Panel sent. PT/OT. Will need to f/up as out pt. This is less likely purely RCVS as there is significant family hx as above. D/w with radiology Pauletta BrownsZEYLIKMAN, Edell Mesenbrink   To contact Stroke Continuity  provider, please refer to [email or web address] After hours, contact General Neurology

## 2014-05-13 NOTE — ED Notes (Signed)
Hospitalist at bedside 

## 2014-05-13 NOTE — ED Notes (Signed)
Pt presents with c/o numbness and tingling to L side of head radiating to fingertips. Pt had MRI dx with 2 acute R sided infarcts. Neuro intact. Pt states she has had numbness in the past but it had resolved, began again last night. Pt did have HA but resolved after taking baby ASA last night.

## 2014-05-13 NOTE — ED Notes (Signed)
EKG given to EDP,Nanavati,MD., for review. 

## 2014-05-13 NOTE — Consult Note (Signed)
Referring Physician: [facility]    Chief Complaint: new onset left sided numbness, abnormal MRI brain  HPI:                                                                                                                                         Angel [name] is an 23 y.o. female with a past medical history significant for episodic migraine, obesity, and anemia, transferred to [facility] for further evaluation of the above stated symptoms. She indicated that she saw her neurologist couple of weeks ago due to worsening migraines and brain MRI was requested was completed 2 days ago and she was called last and informed that the MRI was positive for stroke, and asked to come to the ED if new symptoms developed. Patient indicated that last night she had acute onset of numbness involving left face-arm-leg and she presented to [facility]. Complains of a mild HA but denies vertigo, double vision, difficulty swallowing, imbalance, focal weakness, slurred speech, language difficulty, or bladder impairment.  Michela Pitcher that couple weeks ago she had a transient episode of painless visual loss left eye and still having blurred vision. MRI brain 12/16: Right occipital lobe sulcal T2FLAIR hyperintensity without associated hemorrhage on SWI views. 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 Elsewhere there are a few scattered punctate subcortical T2 hyperintensities in the centrum semiovale. Main considerations would include a variety cerebral vasoconstriction syndromes, some of which may be reversible and others which may be progressive/permanent. These may include vasculitidies (primary CNS angitis, giant cell arteritis, granulomatous disease, other systemic rheumatologic diseases), hypercoagulable states (autoimmune or paraneoplastic), infectious processes (meningo-encephalitis), subarachnoid hemorrhage, as well a group of conditions associated with so called reversible cerebral  vasoconstriction syndrome (RCVS).   Date last known well: 05/12/14 Time last known well: uncertain tPA Given: no, out of the window   Past Medical History  Diagnosis Date  . Anemia   . Headache   . Obesity   . Anxiety     Past Surgical History  Procedure Laterality Date  . None      Family History  Problem Relation Age of Onset  . Cancer Paternal Uncle   . Diabetes Paternal Uncle   . Heart disease Maternal Grandmother   . Heart disease Maternal Grandfather   . Diabetes Paternal Grandmother    Social History:  reports that she has never smoked. She has never used smokeless tobacco. She reports that she does not drink alcohol or use illicit drugs.  Allergies: No Known Allergies  Medications:  Scheduled: .  stroke: mapping our early stages of recovery book   Does not apply Once  . aspirin  300 mg Rectal Daily   Or  . aspirin  325 mg Oral Daily  . atorvastatin  10 mg Oral Daily  . heparin  5,000 Units Subcutaneous 3 times per day  . [START ON 05/14/2014] Influenza vac split quadrivalent PF  0.5 mL Intramuscular Tomorrow-1000    ROS:                                                                                                                                       History obtained from the patient  General ROS: negative for - chills, fatigue, fever, night sweats, weight gain or weight loss Psychological ROS: negative for - behavioral disorder, hallucinations, memory difficulties, mood swings or suicidal ideation Ophthalmic ROS: negative for - double vision or eye pain  ENT ROS: negative for - epistaxis, nasal discharge, oral lesions, sore throat, tinnitus or vertigo Allergy and Immunology ROS: negative for - hives or itchy/watery eyes Hematological and Lymphatic ROS: negative for - bleeding problems, bruising or swollen lymph nodes Endocrine ROS:  negative for - galactorrhea, hair pattern changes, polydipsia/polyuria or temperature intolerance Respiratory ROS: negative for - cough, hemoptysis, shortness of breath or wheezing Cardiovascular ROS: negative for - chest pain, dyspnea on exertion, edema or irregular heartbeat Gastrointestinal ROS: negative for - abdominal pain, diarrhea, hematemesis, nausea/vomiting or stool incontinence Genito-Urinary ROS: negative for - dysuria, hematuria, incontinence or urinary frequency/urgency Musculoskeletal ROS: negative for - joint swelling or muscular weakness Neurological ROS: as noted in HPI Dermatological ROS: negative for rash and skin lesion changes  Physical exam: pleasant female in no apparent distress. Blood pressure 134/70, pulse 81, temperature 98 F (36.7 C), temperature source Oral, resp. rate 16, height 5' 10"  (1.778 m), weight 122.471 kg (270 lb), last menstrual period 04/29/2014, SpO2 100 %. Head: normocephalic. Neck: supple, no bruits, no JVD. Cardiac: no murmurs. Lungs: clear. Abdomen: soft, no tender, no mass. Extremities: no edema. Neurologic Examination:                                                                                                      General: Mental Status: Alert, oriented, thought content appropriate.  Speech fluent without evidence of aphasia.  Able to follow 3 step commands without difficulty. Cranial Nerves: II: Discs flat bilaterally; Visual fields grossly normal, pupils equal, round, reactive to light and accommodation III,IV, VI: ptosis not present, extra-ocular motions  intact bilaterally V,VII: smile symmetric, facial light touch sensation normal bilaterally VIII: hearing normal bilaterally IX,X: gag reflex present XI: bilateral shoulder shrug XII: midline tongue extension without atrophy or fasciculations  Motor: Right : Upper extremity   5/5    Left:     Upper extremity   5/5  Lower extremity   5/5     Lower extremity   5/5 Tone and  bulk:normal tone throughout; no atrophy noted Sensory: Pinprick and light touch intact throughout, bilaterally Deep Tendon Reflexes:  Right: Upper Extremity   Left: Upper extremity   biceps (C-5 to C-6) 2/4   biceps (C-5 to C-6) 2/4 tricep (C7) 2/4    triceps (C7) 2/4 Brachioradialis (C6) 2/4  Brachioradialis (C6) 2/4  Lower Extremity Lower Extremity  quadriceps (L-2 to L-4) 2/4   quadriceps (L-2 to L-4) 2/4 Achilles (S1) 2/4   Achilles (S1) 2/4  Plantars: Right: downgoing   Left: downgoing Cerebellar: normal finger-to-nose,  normal heel-to-shin test Gait:  No tested CV: pulses palpable throughout     Results for orders placed or performed during the hospital encounter of 05/13/14 (from the past 48 hour(s))  CBC with Differential     Status: Abnormal   Collection Time: 05/13/14  2:20 AM  Result Value Ref Range   WBC 8.6 4.0 - 10.5 K/uL   RBC 4.38 3.87 - 5.11 MIL/uL   Hemoglobin 9.0 (L) 12.0 - 15.0 g/dL   HCT 32.1 (L) 36.0 - 46.0 %   MCV 73.3 (L) 78.0 - 100.0 fL   MCH 20.5 (L) 26.0 - 34.0 pg   MCHC 28.0 (L) 30.0 - 36.0 g/dL   RDW 20.2 (H) 11.5 - 15.5 %   Platelets 435 (H) 150 - 400 K/uL   Neutrophils Relative % 74 43 - 77 %   Lymphocytes Relative 20 12 - 46 %   Monocytes Relative 6 3 - 12 %   Eosinophils Relative 0 0 - 5 %   Basophils Relative 0 0 - 1 %   Neutro Abs 6.4 1.7 - 7.7 K/uL   Lymphs Abs 1.7 0.7 - 4.0 K/uL   Monocytes Absolute 0.5 0.1 - 1.0 K/uL   Eosinophils Absolute 0.0 0.0 - 0.7 K/uL   Basophils Absolute 0.0 0.0 - 0.1 K/uL   RBC Morphology POLYCHROMASIA PRESENT    WBC Morphology VACUOLATED NEUTROPHILS   I-Stat Chem 8, ED     Status: Abnormal   Collection Time: 05/13/14  2:33 AM  Result Value Ref Range   Sodium 138 137 - 147 mEq/L   Potassium 4.3 3.7 - 5.3 mEq/L   Chloride 108 96 - 112 mEq/L   BUN 14 6 - 23 mg/dL   Creatinine, Ser 0.60 0.50 - 1.10 mg/dL   Glucose, Bld 130 (H) 70 - 99 mg/dL   Calcium, Ion 1.12 1.12 - 1.23 mmol/L   TCO2 19 0 - 100  mmol/L   Hemoglobin 11.6 (L) 12.0 - 15.0 g/dL   HCT 34.0 (L) 36.0 - 46.0 %  Protime-INR     Status: None   Collection Time: 05/13/14  3:39 AM  Result Value Ref Range   Prothrombin Time 14.0 11.6 - 15.2 seconds   INR 1.07 0.00 - 1.49  APTT     Status: None   Collection Time: 05/13/14  3:39 AM  Result Value Ref Range   aPTT 27 24 - 37 seconds  Comprehensive metabolic panel     Status: Abnormal   Collection Time: 05/13/14  3:39 AM  Result Value Ref Range   Sodium 136 (L) 137 - 147 mEq/L   Potassium 3.9 3.7 - 5.3 mEq/L   Chloride 100 96 - 112 mEq/L   CO2 22 19 - 32 mEq/L   Glucose, Bld 129 (H) 70 - 99 mg/dL   BUN 12 6 - 23 mg/dL   Creatinine, Ser 0.76 0.50 - 1.10 mg/dL   Calcium 9.5 8.4 - 10.5 mg/dL   Total Protein 8.2 6.0 - 8.3 g/dL   Albumin 3.2 (L) 3.5 - 5.2 g/dL   AST 27 0 - 37 U/L   ALT 35 0 - 35 U/L   Alkaline Phosphatase 79 39 - 117 U/L   Total Bilirubin <0.2 (L) 0.3 - 1.2 mg/dL   GFR calc non Af Amer >90 >90 mL/min   GFR calc Af Amer >90 >90 mL/min    Comment: (NOTE) The eGFR has been calculated using the CKD EPI equation. This calculation has not been validated in all clinical situations. eGFR's persistently <90 mL/min signify possible Chronic Kidney Disease.    Anion gap 14 5 - 15  Urinalysis, Routine w reflex microscopic     Status: Abnormal   Collection Time: 05/13/14  4:53 AM  Result Value Ref Range   Color, Urine YELLOW YELLOW   APPearance CLOUDY (A) CLEAR   Specific Gravity, Urine 1.030 1.005 - 1.030   pH 5.5 5.0 - 8.0   Glucose, UA 100 (A) NEGATIVE mg/dL   Hgb urine dipstick NEGATIVE NEGATIVE   Bilirubin Urine NEGATIVE NEGATIVE   Ketones, ur NEGATIVE NEGATIVE mg/dL   Protein, ur NEGATIVE NEGATIVE mg/dL   Urobilinogen, UA 0.2 0.0 - 1.0 mg/dL   Nitrite NEGATIVE NEGATIVE   Leukocytes, UA NEGATIVE NEGATIVE    Comment: MICROSCOPIC NOT DONE ON URINES WITH NEGATIVE PROTEIN, BLOOD, LEUKOCYTES, NITRITE, OR GLUCOSE <1000 mg/dL.   Ct Head Wo  Contrast  05/13/2014   CLINICAL DATA:  New onset left-sided numbness.  A initial encounter.  EXAM: CT HEAD WITHOUT CONTRAST  TECHNIQUE: Contiguous axial images were obtained from the base of the skull through the vertex without intravenous contrast.  COMPARISON:  Prior MRI from 05/10/2014.  FINDINGS: There is no acute intracranial hemorrhage or infarct. No mass lesion or midline shift. Gray-white matter differentiation is well maintained. Ventricles are normal in size without evidence of hydrocephalus. CSF containing spaces are within normal limits. No extra-axial fluid collection.  The calvarium is intact.  Orbital soft tissues are within normal limits.  The paranasal sinuses and mastoid air cells are well pneumatized and free of fluid.  Scalp soft tissues are unremarkable.  IMPRESSION: No acute intracranial process. Recently described small infarcts not visualized on this noncontrast head CT.   Electronically Signed   By: Jeannine Boga M.D.   On: 05/13/2014 03:58    Assessment: 23 y.o. female with a history of episodic migraine without aura, presents with new onset left sided numbness and MRI brain with 2 small acute infarcts in the right splenium of corpus callosum and right occipital lobe. Elsewhere there are a few scattered punctate subcortical T2 hyperintensities in the centrum semiovale. Worsening HA lately and conjunction with MRI findings raises concern for RCVS. Complete stroke work up. Aspirin for now. May need angio, LP if angio negative.  Stroke Risk Factors - obesity  Plan: 1. HgbA1c, fasting lipid panel 2. MRI, MRA  of the brain without contrast 3. Echocardiogram 4. Carotid dopplers 5. Prophylactic therapy-ASPIRIN 6. Risk factor modification 7. Telemetry monitoring 8. Frequent neuro checks 9.  PT/OT SLP  Dorian Pod, MD [facility] [phone]  05/13/2014, 7:08 AM

## 2014-05-13 NOTE — Progress Notes (Signed)
[facility] PROGRESS NOTE  Angel [name] ZOX:[id number] DOB: [birth year] DOA: 05/13/2014 PCP: Altamese , MD  Assessment/Plan: 1. 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 Neurology following 2. Hypercoag panel ordered 3. 2d echo and carotid dopplers pending 4. RPR, HIV, compliments, ANA, CRP pending 2. Microcytic anemia 1. hgb stable 2. Cont to monitor 3. Headache 1. Resolved 2. No ha this AM 4. DVT prophylaxis 1. Heparin subQ  Code Status: Full Family Communication: Pt in room (indicate person spoken with, relationship, and if by phone, the number) Disposition Plan: Pending   Consultants:  Neurology  Procedures:    Antibiotics:   (indicate start date, and stop date if known)  HPI/Subjective: No complaints. Reports feeling well  Objective: Filed Vitals:   05/13/14 0600 05/13/14 0758 05/13/14 0959 05/13/14 1200  BP: 134/70 132/60 120/79 130/74  Pulse: 81 90 88 85  Temp: 98 F (36.7 C) 98.8 F (37.1 C) 97.9 F (36.6 C) 98.4 F (36.9 C)  TempSrc: Oral Oral Oral Oral  Resp:  16 16 20   Height:      Weight:      SpO2:  100% 100% 100%    Intake/Output Summary (Last 24 hours) at 05/13/14 1505 Last data filed at 05/13/14 0758  Gross per 24 hour  Intake    480 ml  Output      0 ml  Net    480 ml   Filed Weights   05/13/14 0116  Weight: 122.471 kg (270 lb)    Exam:   General:  Awake, in nad  Cardiovascular: regular, s1, s2  Respiratory: normal resp effort, no wheezing  Abdomen: soft,nondistended  Musculoskeletal: perfused, no clubbing   Data Reviewed: Basic Metabolic Panel:  Recent Labs Lab 05/13/14 0233 05/13/14 0339  NA 138 136*  K 4.3 3.9  CL 108 100  CO2  --  22  GLUCOSE 130* 129*  BUN 14 12  CREATININE 0.60 0.76  CALCIUM  --  9.5   Liver Function Tests:  Recent Labs Lab 05/13/14 0339  AST 27  ALT 35  ALKPHOS 79  BILITOT <0.2*  PROT 8.2  ALBUMIN 3.2*   No results for input(s): LIPASE, AMYLASE in the  last 168 hours. No results for input(s): AMMONIA in the last 168 hours. CBC:  Recent Labs Lab 05/13/14 0220 05/13/14 0233  WBC 8.6  --   NEUTROABS 6.4  --   HGB 9.0* 11.6*  HCT 32.1* 34.0*  MCV 73.3*  --   PLT 435*  --    Cardiac Enzymes:  Recent Labs Lab 05/13/14 0820  TROPONINI <0.30   BNP (last 3 results) No results for input(s): PROBNP in the last 8760 hours. CBG: No results for input(s): GLUCAP in the last 168 hours.  No results found for this or any previous visit (from the past 240 hour(s)).   Studies: Ct Head Wo Contrast  05/13/2014   CLINICAL DATA:  New onset left-sided numbness.  A initial encounter.  EXAM: CT HEAD WITHOUT CONTRAST  TECHNIQUE: Contiguous axial images were obtained from the base of the skull through the vertex without intravenous contrast.  COMPARISON:  Prior MRI from 05/10/2014.  FINDINGS: There is no acute intracranial hemorrhage or infarct. No mass lesion or midline shift. Gray-white matter differentiation is well maintained. Ventricles are normal in size without evidence of hydrocephalus. CSF containing spaces are within normal limits. No extra-axial fluid collection.  The calvarium is intact.  Orbital soft tissues are within normal limits.  The paranasal sinuses  and mastoid air cells are well pneumatized and free of fluid.  Scalp soft tissues are unremarkable.  IMPRESSION: No acute intracranial process. Recently described small infarcts not visualized on this noncontrast head CT.   Electronically Signed   By: Rise Mu M.D.   On: 05/13/2014 03:58   Mr Brain Wo Contrast  05/13/2014   ADDENDUM REPORT: 05/13/2014 13:28  ADDENDUM: Study discussed by telephone with Dr. Loretha Brasil (covering for Dr. Lorretta Harp ) on 05/13/2014 at 1313 hrs.   Electronically Signed   By: Augusto Gamble M.D.   On: 05/13/2014 13:28   05/13/2014   CLINICAL DATA:  23 year old female with progressive chronic headaches. Abnormal brain MRI on 05/10/2014. Initial encounter.  The  patient advises transient left side vision loss 2 weeks ago which improved spontaneously.  EXAM: MRI HEAD WITHOUT CONTRAST  MRA HEAD WITHOUT CONTRAST  TECHNIQUE: Multiplanar, multiecho pulse sequences of the brain and surrounding structures were obtained without intravenous contrast. Angiographic images of the head were obtained using MRA technique without contrast.  COMPARISON:  Brain MRI 05/10/2014.  Head CT 0316 hr today.  FINDINGS: MRI HEAD FINDINGS  Small foci of restricted diffusion re - identified in the posterior hemisphere, including at the right occipital pole and also in the right splenium of the corpus callosum. In this region there is abnormal increased FLAIR and trace diffusion signal in the sulci of the left occipital lobe (series 7, image 14). No associated abnormal subarachnoid signal on other sequences. These findings have not significantly changed since 3 days ago.  No other restricted diffusion identified. Subarachnoid signal elsewhere is within normal limits. Angel Cullens and white matter signal elsewhere is stable and normal for age. No areas of abnormal susceptibility identified in the brain. No cortical encephalomalacia.  Major intracranial vascular flow voids are stable and within normal limits. No midline shift, mass effect, evidence of mass lesion, or ventriculomegaly. Cervicomedullary junction and pituitary are within normal limits. Negative visualized cervical spine.  Nonspecific decreased T1 bone marrow signal, specially in the visible cervical spine. Visualized scalp soft tissues are within normal limits. Visible internal auditory structures appear normal. Visualized paranasal sinuses and mastoids are clear. Visualized orbit soft tissues are within normal limits.  MRA HEAD FINDINGS  Antegrade flow in the posterior circulation with codominant distal vertebral arteries. Normal right PICA and dominant appearing left AICA. Normal vertebrobasilar junction. Normal basilar artery. Normal AICA and SCA  origins. Fetal type left PCA origin. Normal right PCA origin. Right posterior communicating artery is present.  Both proximal PCA branches are within normal limits, but there is abrupt occlusion of the distal right PCA just at or beyond the P3 segment, it near where the superior an inferior PCA division branches arise. Distal left PCA branches are normal.  Antegrade flow in both ICA siphons. Negative distal cervical ICAs. No siphon stenosis. Ophthalmic and posterior communicating artery origins are normal. Normal carotid termini. Normal MCA and ACA origins. Anterior communicating artery and visualized bilateral ACA branches are within normal limits. Visualized left MCA branches are within normal limits. Visualized right MCA branches are within normal limits. Distal posterior right MCA flow may be mildly hyperdynamic, an compensation for the right PCA findings.  IMPRESSION: 1. Stable abnormal signal in the right occipital lobe and at the right splenium of the corpus callosum since 05/10/2014. Some of the abnormal signal seems confined to the right occipital subarachnoid space as before. 2. However, there is distal right PCA occlusion (right P3 segments). 3. Therefore, favor the constellation  of findings is post ischemic in nature. 4. Nonetheless, CSF analysis still may be valuable to confirm No abnormal subarachnoid process. 5. No other MRI or intracranial MRA abnormality identified.  Electronically Signed: By: Augusto GambleLee  Hall M.D. On: 05/13/2014 12:29   Mr Maxine GlennMra Head/brain Wo Cm  05/13/2014   ADDENDUM REPORT: 05/13/2014 13:28  ADDENDUM: Study discussed by telephone with Dr. Loretha BrasilZeylikman (covering for Dr. Lorretta HarpXILIN NIU ) on 05/13/2014 at 1313 hrs.   Electronically Signed   By: Augusto GambleLee  Hall M.D.   On: 05/13/2014 13:28   05/13/2014   CLINICAL DATA:  [age] year old female with progressive chronic headaches. Abnormal brain MRI on 05/10/2014. Initial encounter.  The patient advises transient left side vision loss 2 weeks ago which  improved spontaneously.  EXAM: MRI HEAD WITHOUT CONTRAST  MRA HEAD WITHOUT CONTRAST  TECHNIQUE: Multiplanar, multiecho pulse sequences of the brain and surrounding structures were obtained without intravenous contrast. Angiographic images of the head were obtained using MRA technique without contrast.  COMPARISON:  Brain MRI 05/10/2014.  Head CT 0316 hr today.  FINDINGS: MRI HEAD FINDINGS  Small foci of restricted diffusion re - identified in the posterior hemisphere, including at the right occipital pole and also in the right splenium of the corpus callosum. In this region there is abnormal increased FLAIR and trace diffusion signal in the sulci of the left occipital lobe (series 7, image 14). No associated abnormal subarachnoid signal on other sequences. These findings have not significantly changed since 3 days ago.  No other restricted diffusion identified. Subarachnoid signal elsewhere is within normal limits. Angel [name] and white matter signal elsewhere is stable and normal for age. No areas of abnormal susceptibility identified in the brain. No cortical encephalomalacia.  Major intracranial vascular flow voids are stable and within normal limits. No midline shift, mass effect, evidence of mass lesion, or ventriculomegaly. Cervicomedullary junction and pituitary are within normal limits. Negative visualized cervical spine.  Nonspecific decreased T1 bone marrow signal, specially in the visible cervical spine. Visualized scalp soft tissues are within normal limits. Visible internal auditory structures appear normal. Visualized paranasal sinuses and mastoids are clear. Visualized orbit soft tissues are within normal limits.  MRA HEAD FINDINGS  Antegrade flow in the posterior circulation with codominant distal vertebral arteries. Normal right PICA and dominant appearing left AICA. Normal vertebrobasilar junction. Normal basilar artery. Normal AICA and SCA origins. Fetal type left PCA origin. Normal right PCA origin.  Right posterior communicating artery is present.  Both proximal PCA branches are within normal limits, but there is abrupt occlusion of the distal right PCA just at or beyond the P3 segment, it near where the superior an inferior PCA division branches arise. Distal left PCA branches are normal.  Antegrade flow in both ICA siphons. Negative distal cervical ICAs. No siphon stenosis. Ophthalmic and posterior communicating artery origins are normal. Normal carotid termini. Normal MCA and ACA origins. Anterior communicating artery and visualized bilateral ACA branches are within normal limits. Visualized left MCA branches are within normal limits. Visualized right MCA branches are within normal limits. Distal posterior right MCA flow may be mildly hyperdynamic, an compensation for the right PCA findings.  IMPRESSION: 1. Stable abnormal signal in the right occipital lobe and at the right splenium of the corpus callosum since 05/10/2014. Some of the abnormal signal seems confined to the right occipital subarachnoid space as before. 2. However, there is distal right PCA occlusion (right P3 segments). 3. 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 4.  Nonetheless, CSF analysis still may be valuable to confirm No abnormal subarachnoid process. 5. No other MRI or intracranial MRA abnormality identified.  Electronically Signed: By: Augusto GambleLee  Hall M.D. On: 05/13/2014 12:29    Scheduled Meds: .  stroke: mapping our early stages of recovery book   Does not apply Once  . aspirin  300 mg Rectal Daily   Or  . aspirin  325 mg Oral Daily  . atorvastatin  10 mg Oral Daily  . heparin  5,000 Units Subcutaneous 3 times per day  . [START ON 05/14/2014] Influenza vac split quadrivalent PF  0.5 mL Intramuscular Tomorrow-1000   Continuous Infusions:   Principal Problem:   Acute ischemic stroke Active Problems:   Migraine   Morbid obesity   Stroke   Anemia  Time spent: 30min  CHIU, STEPHEN  K  [facility] Pager [phone]. If 7PM-7AM, please contact night-coverage at [email or web address], password [email or web address] 05/13/2014, 3:05 PM  LOS: 0 days

## 2014-05-13 NOTE — ED Notes (Signed)
Patient transported to CT 

## 2014-05-13 NOTE — ED Provider Notes (Signed)
CSN: [id number]     Arrival date & time 05/13/14  0023 History   First MD Initiated Contact with Patient 05/13/14 0146     Chief Complaint  Patient presents with  . Numbness     (Consider location/radiation/quality/duration/timing/severity/associated sxs/prior Treatment) HPI Comments: Pt comes in with cc of numbness. Pt was having some blurry vision and headaches - and her neurologist had ordered a MRI, which showed acute stoke. Pt was informed to go to the ER if she has a new stroke. At 10 pm, pt started having numbness to the right arm and legs. PT describes her symptoms as tingling. She has no facial symptoms. Denies weakness, vision complains, balance problems.  The history is provided by the patient.    Past Medical History  Diagnosis Date  . Anemia   . Headache   . Obesity   . Anxiety    Past Surgical History  Procedure Laterality Date  . None     Family History  Problem Relation Age of Onset  . Cancer Paternal Uncle   . Diabetes Paternal Uncle   . Heart disease Maternal Grandmother   . Heart disease Maternal Grandfather   . Diabetes Paternal Grandmother    History  Substance Use Topics  . Smoking status: Never Smoker   . Smokeless tobacco: Never Used  . Alcohol Use: No   OB History    No data available     Review of Systems  Constitutional: Negative for activity change.  HENT: Negative for facial swelling.   Eyes: Negative for visual disturbance.  Respiratory: Negative for cough, shortness of breath and wheezing.   Cardiovascular: Negative for chest pain.  Gastrointestinal: Negative for nausea, vomiting, abdominal pain, diarrhea, constipation, blood in stool and abdominal distention.  Genitourinary: Negative for hematuria and difficulty urinating.  Musculoskeletal: Negative for neck pain.  Skin: Negative for color change.  Neurological: Positive for numbness and headaches. Negative for dizziness, tremors, syncope, facial asymmetry, speech difficulty,  weakness and light-headedness.  Hematological: Does not bruise/bleed easily.  Psychiatric/Behavioral: Negative for confusion.      Allergies  Review of patient's allergies indicates no known allergies.  Home Medications   Prior to Admission medications   Medication Sig Start Date End Date Taking? Authorizing Provider  naproxen (NAPROSYN) 500 MG tablet Take 1 tablet (500 mg total) by mouth 2 (two) times daily with a meal. 05/01/14  Yes Einar GipWilliam Duncan Dansie, PA-C  norgestimate-ethinyl estradiol (ORTHO-CYCLEN,SPRINTEC,PREVIFEM) 0.25-35 MG-MCG tablet Take 1 tablet by mouth daily.   Yes Historical Provider, MD  ondansetron (ZOFRAN ODT) 4 MG disintegrating tablet Take 1 tablet (4 mg total) by mouth every 8 (eight) hours as needed for nausea or vomiting. 05/01/14  Yes Einar GipWilliam Duncan Dansie, PA-C  phenylephrine (SUDAFED PE) 10 MG TABS tablet Take 10 mg by mouth every 4 (four) hours as needed. Congestion   Yes Historical Provider, MD  sodium chloride (OCEAN) 0.65 % SOLN nasal spray Place 1 spray into both nostrils daily as needed for congestion.   Yes Historical Provider, MD  atorvastatin (LIPITOR) 10 MG tablet Take 1 tablet (10 mg total) by mouth daily. 05/12/14   Anson FretAntonia B Ahern, MD  eletriptan (RELPAX) 40 MG tablet Take 1 tablet (40 mg total) by mouth as needed for migraine or headache. One tablet by mouth at onset of headache. May repeat in 2 hours if headache persists or recurs. Patient not taking: Reported on 05/13/2014 03/13/14   Anson FretAntonia B Ahern, MD  topiramate (TOPAMAX) 50 MG tablet Take 1  tablet (50 mg total) by mouth 2 (two) times daily. Patient not taking: Reported on 05/13/2014 03/31/14   Anson FretAntonia B Ahern, MD   BP 137/82 mmHg  Pulse 91  Temp(Src) 98 F (36.7 C) (Oral)  Resp 19  Ht 5\' 10"  (1.778 m)  Wt 270 lb (122.471 kg)  BMI 38.74 kg/m2  SpO2 98%  LMP 04/29/2014 Physical Exam  Constitutional: She is oriented to person, place, and time. She appears well-developed and  well-nourished.  HENT:  Head: Normocephalic and atraumatic.  Eyes: EOM are normal. Pupils are equal, round, and reactive to light.  Neck: Neck supple.  Cardiovascular: Normal rate, regular rhythm and normal heart sounds.   No murmur heard. Pulmonary/Chest: Effort normal. No respiratory distress.  Abdominal: Soft. She exhibits no distension. There is no tenderness. There is no rebound and no guarding.  Neurological: She is alert and oriented to person, place, and time. No cranial nerve deficit. Coordination normal.  Cerebellar exam is normal (finger to nose) Sensory exam normal for bilateral upper and lower extremities - and patient is able to discriminate between sharp and dull. Motor exam is 4+/5  NIHSS - 0  Skin: Skin is warm and dry.  Nursing note and vitals reviewed.   ED Course  Procedures (including critical care time) Labs Review Labs Reviewed  CBC WITH DIFFERENTIAL - Abnormal; Notable for the following:    Hemoglobin 9.0 (*)    HCT 32.1 (*)    MCV 73.3 (*)    MCH 20.5 (*)    MCHC 28.0 (*)    RDW 20.2 (*)    Platelets 435 (*)    All other components within normal limits  I-STAT CHEM 8, ED - Abnormal; Notable for the following:    Glucose, Bld 130 (*)    Hemoglobin 11.6 (*)    HCT 34.0 (*)    All other components within normal limits  PROTIME-INR  APTT  COMPREHENSIVE METABOLIC PANEL  URINALYSIS, ROUTINE W REFLEX MICROSCOPIC  URINE RAPID DRUG SCREEN (HOSP PERFORMED)    Imaging Review No results found.  Recent MRI IMPRESSION:  Abnormal MRI brain (without) demonstrating: - Right occipital lobe sulcal T2FLAIR hyperintensity without associated hemorrhage on SWI views. 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 Elsewhere there are a few scattered punctate subcortical T2 hyperintensities in the centrum semiovale. Main considerations would include a variety cerebral vasoconstriction syndromes, some of which may  be reversible and others which may be progressive/permanent. These may include vasculitidies (primary CNS angitis, giant cell arteritis, granulomatous disease, other systemic rheumatologic diseases), hypercoagulable states (autoimmune or paraneoplastic), infectious processes (meningo-encephalitis), subarachnoid hemorrhage, as well a group of conditions associated with so called reversible cerebral vasoconstriction syndrome (RCVS).  - Correlation with CSF and serum analysis, follow up post-contrast imaging and angiography, and clinical findings advised.  EKG Interpretation None      MDM   Final diagnoses:  Left arm numbness  Acute ischemic stroke   PT comes in with cc of L sided numbness. She also has some blurry vision, but it is not new. She had a recent MRI, and results are as above. Symptoms started at 10 am, i saw the patient after 2 am, she is outside the TPA window. Spoke with Dr. Leroy Kennedyamilo, Neurology immediately however, and he would want the [facility]. Pt has a broad differential for the stroke, and will benefit from admission with neuro workup. Pt made aware of the plan.  NIHSS - 0. Subjective tingling only. Sensory exam is equal bilaterally    Derwood Kaplan, MD 05/13/14 [phone]

## 2014-05-13 NOTE — ED Notes (Signed)
MD at bedside. 

## 2014-05-13 NOTE — H&P (Addendum)
[facility] History and Physical  Mariangela [name] MRN:[id number] DOB: 10/29/1990 DOA: 05/13/2014  Referring physician: ED physician PCP: MARTIN,[name], MD  Specialists:   Chief Complaint: Left side numbness.  HPI: Angel [name] is a 23 y.o. female with past medical history of anemia, headache, obesity, who presents with left side numbness.  Patient reports that she has chronic migraine headache. Her headache was worsening on 12/16. Her neurologist ordered an MRI of the brain for her, which cames back positive for stroke. She was told that if she develops numbness or weakness, she should come to the hospital. Today patient developed numbness and tingling sensations in left arm and leg.  she comes to the hospital for further evaluation and treatment. Of note, patient reports that she had transient vision loss in left eye 2 weeks ago, which lasted for about 2 minutes, then resolved spontaneously. He was evaluated by ED and referred to ophthalmologist. Her eye examination was normal, and was told that she needed a glass.  Patient had very mild chest pain which lasted for a few seconds, it was associated with shortness of breath. No cough. Currently, she denies fever, chills, cough, chest pain, SOB, abdominal pain, diarrhea, constipation, dysuria, urgency, frequency, hematuria, skin rashes, joint pain or leg swelling.  Work up in the ED demonstrates anemia with hemoglobin 11.6. Negative CT head for acute abnormalities. EKG showed T-wave inversion in lead III/aVF and V3 to V4.  Review of Systems: As presented in the history of presenting illness, rest negative.  Where does patient live?  At home Can patient participate in ADLs? Yes  Allergy: No Known Allergies  Past Medical History  Diagnosis Date  . Anemia   . Headache   . Obesity   . Anxiety     Past Surgical History  Procedure Laterality Date  . None      Social History:  reports that she has never smoked. She has never used  smokeless tobacco. She reports that she does not drink alcohol or use illicit drugs.  Family History:  Family History  Problem Relation Age of Onset  . Cancer Paternal Uncle   . Diabetes Paternal Uncle   . Heart disease Maternal Grandmother   . Heart disease Maternal Grandfather   . Diabetes Paternal Grandmother      Prior to Admission medications   Medication Sig Start Date End Date Taking? Authorizing Provider  naproxen (NAPROSYN) 500 MG tablet Take 1 tablet (500 mg total) by mouth 2 (two) times daily with a meal. 05/01/14  Yes William Duncan Dansie, PA-C  norgestimate-ethinyl estradiol (ORTHO-CYCLEN,SPRINTEC,PREVIFEM) 0.25-35 MG-MCG tablet Take 1 tablet by mouth daily.   Yes Historical Provider, MD  ondansetron (ZOFRAN ODT) 4 MG disintegrating tablet Take 1 tablet (4 mg total) by mouth every 8 (eight) hours as needed for nausea or vomiting. 05/01/14  Yes William Duncan Dansie, PA-C  phenylephrine (SUDAFED PE) 10 MG TABS tablet Take 10 mg by mouth every 4 (four) hours as needed. Congestion   Yes Historical Provider, MD  sodium chloride (OCEAN) 0.65 % SOLN nasal spray Place 1 spray into both nostrils daily as needed for congestion.   Yes Historical Provider, MD  atorvastatin (LIPITOR) 10 MG tablet Take 1 tablet (10 mg total) by mouth daily. 05/12/14   Antonia B Ahern, MD  eletriptan (RELPAX) 40 MG tablet Take 1 tablet (40 mg total) by mouth as needed for migraine or headache. One tablet by mouth at onset of headache. May repeat in 2 hours if headache persists   or recurs. Patient not taking: Reported on 05/13/2014 03/13/14   Antonia B Ahern, MD  topiramate (TOPAMAX) 50 MG tablet Take 1 tablet (50 mg total) by mouth 2 (two) times daily. Patient not taking: Reported on 05/13/2014 03/31/14   Antonia B Ahern, MD    Physical Exam: Filed Vitals:   05/13/14 0300 05/13/14 0319 05/13/14 0400 05/13/14 0415  BP: 137/82  155/75   Pulse: 91  106 83  Temp: 98 F (36.7 C) 98 F (36.7 C)    TempSrc:  Oral     Resp: 19  28 14  Height:      Weight:      SpO2: 98%  100% 100%   General: Not in acute distress HEENT:       Eyes: PERRL, EOMI, no scleral icterus       ENT: No discharge from the ears and nose, no pharynx injection, no tonsillar enlargement.        Neck: No JVD, no bruit, no mass felt. Cardiac: S1/S2, RRR, No murmurs, No gallops or rubs Pulm: Good air movement bilaterally. Clear to auscultation bilaterally. No rales, wheezing, rhonchi or rubs. Abd: Soft, nondistended, nontender, no rebound pain, no organomegaly, BS present Ext: No edema bilaterally. 2+DP/PT pulse bilaterally Musculoskeletal: No joint deformities, erythema, or stiffness, ROM full Skin: No rashes.  Neuro: Alert and oriented X3, cranial nerves II-XII grossly intact, muscle strength 5/5 in all extremeties, decreased sensation to light touch intact on the left side. Brachial reflex 2+ bilaterally. Knee reflex 1+ bilaterally. Negative Babinski's sign. Normal finger to nose test. Psych: Patient is not psychotic, no suicidal or hemocidal ideation.  Labs on Admission:  Basic Metabolic Panel:  Recent Labs Lab 05/13/14 0233 05/13/14 0339  NA 138 136*  K 4.3 3.9  CL 108 100  CO2  --  22  GLUCOSE 130* 129*  BUN 14 12  CREATININE 0.60 0.76  CALCIUM  --  9.5   Liver Function Tests:  Recent Labs Lab 05/13/14 0339  AST 27  ALT 35  ALKPHOS 79  BILITOT <0.2*  PROT 8.2  ALBUMIN 3.2*   No results for input(s): LIPASE, AMYLASE in the last 168 hours. No results for input(s): AMMONIA in the last 168 hours. CBC:  Recent Labs Lab 05/13/14 0220 05/13/14 0233  WBC 8.6  --   NEUTROABS 6.4  --   HGB 9.0* 11.6*  HCT 32.1* 34.0*  MCV 73.3*  --   PLT 435*  --    Cardiac Enzymes: No results for input(s): CKTOTAL, CKMB, CKMBINDEX, TROPONINI in the last 168 hours.  BNP (last 3 results) No results for input(s): PROBNP in the last 8760 hours. CBG: No results for input(s): GLUCAP in the last 168  hours.  Radiological Exams on Admission: Ct Head Wo Contrast  05/13/2014   CLINICAL DATA:  New onset left-sided numbness.  A initial encounter.  EXAM: CT HEAD WITHOUT CONTRAST  TECHNIQUE: Contiguous axial images were obtained from the base of the skull through the vertex without intravenous contrast.  COMPARISON:  Prior MRI from 05/10/2014.  FINDINGS: There is no acute intracranial hemorrhage or infarct. No mass lesion or midline shift. Gray-white matter differentiation is well maintained. Ventricles are normal in size without evidence of hydrocephalus. CSF containing spaces are within normal limits. No extra-axial fluid collection.  The calvarium is intact.  Orbital soft tissues are within normal limits.  The paranasal sinuses and mastoid air cells are well pneumatized and free of fluid.  Scalp soft tissues are   unremarkable.  IMPRESSION: No acute intracranial process. Recently described small infarcts not visualized on this noncontrast head CT.   Electronically Signed   By: Jeannine Boga M.D.   On: 05/13/2014 03:58    EKG: Independently reviewed. T wave inversion in lead III/aVF and in V3 to V4.  Assessment/Plan Principal Problem:   Acute ischemic stroke Active Problems:   Migraine   Morbid obesity   Stroke   Anemia  Ischemic stroke: iteology is not clear. Patient does not smoke, no history of diabetes. No signs of infection. At this young age, need to rule out vasculitis and other common risks factors, including diabetes, hyperlipidemia. Neurology was consulted by ED. Will be transferred her to [facility] hospital.  - will admit to tele bed - Risk factor modification: HgbA1c, fasting lipid panel, ESR, Crp, ANA - MRI, MRA of the brain without contrast  - PT consult, OT consult, Speech consult  - 2 d Echocardiogram  - Ekg  - Carotid dopplers  - Aspirin 325 mg per day  - check UDS - hold norgestimate-ethinyl estradiol - trop x 3 given abnormal EKG  Microcytic Anemia: Hemoglobin 11.6.  MCV 73.3, likely due to iron deficiency -Check anemia panel  Migraine headache: -Hold eletriptan in the setting of acute stroke.  DVT ppx: SQ Heparin     Code Status: Full code Family Communication:   Yes, patient's   fianc    at bed side Disposition Plan: Admit to inpatient   Date of Service 05/13/2014    Ivor Costa [facility] Pager [phone]  If 7PM-7AM, please contact night-coverage [email or web address] Password [facility] 05/13/2014, 4:39 AM

## 2014-05-13 NOTE — Progress Notes (Signed)
Received [facility] via [facility] transport team at 5:12am.  Patient was alert and oriented with no notable complaints upon arrival.

## 2014-05-13 NOTE — Progress Notes (Signed)
VASCULAR LAB PRELIMINARY  PRELIMINARY  PRELIMINARY  PRELIMINARY  Carotid Dopplers completed.    Preliminary report:  1-39% ICA stenosis.  Unable to insonate right vertebral flow.  Left vertebral artery flow is antegrade.   Angel [name], RVT 05/13/2014, 3:49 PM    '

## 2014-05-14 ENCOUNTER — Inpatient Hospital Stay (HOSPITAL_COMMUNITY): Payer: Medicaid Other

## 2014-05-14 ENCOUNTER — Encounter (HOSPITAL_COMMUNITY): Payer: Self-pay | Admitting: Radiology

## 2014-05-14 DIAGNOSIS — I059 Rheumatic mitral valve disease, unspecified: Secondary | ICD-10-CM

## 2014-05-14 LAB — HIV ANTIBODY (ROUTINE TESTING W REFLEX): HIV: NONREACTIVE

## 2014-05-14 LAB — RETICULOCYTES
RBC.: 4.1 MIL/uL (ref 3.87–5.11)
Retic Count, Absolute: 36.9 10*3/uL (ref 19.0–186.0)
Retic Ct Pct: 0.9 % (ref 0.4–3.1)

## 2014-05-14 LAB — IRON AND TIBC
Iron: 17 ug/dL — ABNORMAL LOW (ref 42–135)
Saturation Ratios: 4 % — ABNORMAL LOW (ref 20–55)
TIBC: 426 ug/dL (ref 250–470)
UIBC: 409 ug/dL — AB (ref 125–400)

## 2014-05-14 LAB — TROPONIN I
Troponin I: <0.3 ng/mL (ref <0.30)
Troponin I: <0.3 ng/mL (ref <0.30)

## 2014-05-14 LAB — FERRITIN: FERRITIN: 10 ng/mL (ref 10–291)

## 2014-05-14 LAB — ANTITHROMBIN III: ANTITHROMB III FUNC: 76 % (ref 75–120)

## 2014-05-14 LAB — HOMOCYSTEINE: Homocysteine: 7.5 umol/L (ref 4.0–15.4)

## 2014-05-14 LAB — SYPHILIS: RPR W/REFLEX TO RPR TITER AND TREPONEMAL ANTIBODIES, TRADITIONAL SCREENING AND DIAGNOSIS ALGORITHM

## 2014-05-14 LAB — VITAMIN B12: Vitamin B-12: 249 pg/mL (ref 211–911)

## 2014-05-14 LAB — FOLATE: Folate: 7.6 ng/mL

## 2014-05-14 MED ORDER — ACETAMINOPHEN 325 MG PO TABS
650.0000 mg | ORAL_TABLET | ORAL | Status: AC | PRN
  Administered 2014-05-14 – 2014-05-15 (×3): 650 mg via ORAL
  Filled 2014-05-14 (×2): qty 2

## 2014-05-14 MED ORDER — ATORVASTATIN CALCIUM 10 MG PO TABS
20.0000 mg | ORAL_TABLET | Freq: Every day | ORAL | Status: DC
  Administered 2014-05-14 – 2014-05-17 (×4): 20 mg via ORAL
  Filled 2014-05-14 (×3): qty 2

## 2014-05-14 MED ORDER — ALUM & MAG HYDROXIDE-SIMETH 200-200-20 MG/5ML PO SUSP
15.0000 mL | ORAL | Status: DC | PRN
  Administered 2014-05-14 – 2014-05-16 (×3): 15 mL via ORAL
  Filled 2014-05-14 (×3): qty 30

## 2014-05-14 NOTE — Progress Notes (Signed)
  Echocardiogram 2D Echocardiogram has been performed.  Delcie RochENNINGTON, Jared Whorley 05/14/2014, 3:58 PM

## 2014-05-14 NOTE — Progress Notes (Signed)
Utilization Review Completed.   Norbert Malkin, RN, BSN Nurse Case Manager  

## 2014-05-14 NOTE — Progress Notes (Signed)
STROKE TEAM PROGRESS NOTE   HISTORY Angel [name] is an 23 y.o. female with a past medical history significant for episodic migraine, obesity, and anemia, [facility] department for further evaluation of new onset left-sided numbness and abnormal MRI. She indicated that she saw her neurologist couple of weeks ago due to worsening migraines and brain MRI was requested was completed 2 days ago and she was called last and informed that the MRI was positive for stroke, and asked to come to the ED if new symptoms developed. Patient indicated that last night she had acute onset of numbness involving left face-arm-leg and she presented to [facility]-ED. Complains of a mild HA but denies vertigo, double vision, difficulty swallowing, imbalance, focal weakness, slurred speech, language difficulty, or bladder impairment. York SpanielSaid that couple weeks ago she had a transient episode of painless visual loss left eye and still having blurred vision.  MRI brain 12/16: Right occipital lobe sulcal T2FLAIR hyperintensity without associated hemorrhage on SWI views. 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 Elsewhere there are a few scattered punctate subcortical T2 hyperintensities in the centrum semiovale. Main considerations would include a variety cerebral vasoconstriction syndromes, some of which may be reversible and others which may be progressive/permanent. These may include vasculitidies (primary CNS angitis, giant cell arteritis, granulomatous disease, other systemic rheumatologic diseases), hypercoagulable states (autoimmune or paraneoplastic), infectious processes (meningo-encephalitis), subarachnoid hemorrhage, as well a group of conditions associated with so called reversible cerebral vasoconstriction syndrome (RCVS).   Date last known well: 05/12/14 Time last known well: uncertain tPA Given: no, out of the  window   SUBJECTIVE (INTERVAL HISTORY) No family members present. The patient feels she is improving. She reports that there is a family history of coagulopathy with multiple members of her family on anticoagulation including her mother.   OBJECTIVE Temp:  [97.9 F (36.6 C)-98.6 F (37 C)] 98.5 F (36.9 C) (12/20 0701) Pulse Rate:  [57-90] 58 (12/20 0701) Cardiac Rhythm:  [-] Normal sinus rhythm;Sinus tachycardia (12/19 0900) Resp:  [16-20] 18 (12/20 0701) BP: (109-134)/(55-79) 109/55 mmHg (12/20 0701) SpO2:  [100 %] 100 % (12/20 0701)  No results for input(s): GLUCAP in the last 168 hours.  Recent Labs Lab 05/13/14 0233 05/13/14 0339  NA 138 136*  K 4.3 3.9  CL 108 100  CO2  --  22  GLUCOSE 130* 129*  BUN 14 12  CREATININE 0.60 0.76  CALCIUM  --  9.5    Recent Labs Lab 05/13/14 0339  AST 27  ALT 35  ALKPHOS 79  BILITOT <0.2*  PROT 8.2  ALBUMIN 3.2*    Recent Labs Lab 05/13/14 0220 05/13/14 0233  WBC 8.6  --   NEUTROABS 6.4  --   HGB 9.0* 11.6*  HCT 32.1* 34.0*  MCV 73.3*  --   PLT 435*  --     Recent Labs Lab 05/13/14 0820 05/13/14 1830 05/14/14 0112  TROPONINI <0.30 <0.30 <0.30    Recent Labs  05/13/14 0339  LABPROT 14.0  INR 1.07    Recent Labs  05/13/14 0453  COLORURINE YELLOW  LABSPEC 1.030  PHURINE 5.5  GLUCOSEU 100*  HGBUR NEGATIVE  BILIRUBINUR NEGATIVE  KETONESUR NEGATIVE  PROTEINUR NEGATIVE  UROBILINOGEN 0.2  NITRITE NEGATIVE  LEUKOCYTESUR NEGATIVE       Component Value Date/Time   CHOL 142 05/13/2014 0820   TRIG 77 05/13/2014 0820   HDL 55 05/13/2014 0820  CHOLHDL 2.6 05/13/2014 0820   VLDL 15 05/13/2014 0820   LDLCALC 72 05/13/2014 0820   Lab Results  Component Value Date   HGBA1C 6.2* 05/13/2014      Component Value Date/Time   LABOPIA NONE DETECTED 05/13/2014 0453   COCAINSCRNUR NONE DETECTED 05/13/2014 0453   LABBENZ NONE DETECTED 05/13/2014 0453   AMPHETMU NONE DETECTED 05/13/2014 0453   THCU  NONE DETECTED 05/13/2014 0453   LABBARB NONE DETECTED 05/13/2014 0453    No results for input(s): ETH in the last 168 hours.  Ct Head Wo Contrast 05/13/2014    No acute intracranial process. Recently described small infarcts not visualized on this noncontrast head CT.     MRI / MRA Brain Wo Contrast 1. Stable abnormal signal in the right occipital lobe and at the right splenium of the corpus callosum since 05/10/2014. Some of the abnormal signal seems confined to the right occipital subarachnoid space as before. 2. However, there is distal right PCA occlusion (right P3 segments).  3. 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  4. Nonetheless, CSF analysis still may be valuable to confirm No abnormal subarachnoid process.  5. No other MRI or intracranial MRA abnormality identified.       PHYSICAL EXAM  Mental Status: Alert, oriented, thought content appropriate. Speech fluent without evidence of aphasia. Able to follow 3 step commands without difficulty. Cranial Nerves: II: Pupils equal, round, reactive to light and accommodation. Left homonymous hemianopsia III,IV, VI: ptosis not present, extra-ocular motions intact bilaterally V,VII: smile symmetric, facial light touch sensation normal bilaterally VIII: hearing normal bilaterally IX,X: gag reflex present XI: bilateral shoulder shrug XII: midline tongue extension without atrophy or fasciculations Motor:  Strength 5/5 all extremities. Sensory: Intact to light touch Cerebellar: normal finger-to-nose Gait:  Not tested   ASSESSMENT/PLAN Ms. Angel [name] is a 23 y.o. female with history of migraine headaches, obesity anemia and family history of coagulopathy,  presenting with left-sided numbness . She did not receive IV t-PA due to minimal deficits and late presentation.   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  Resultant -  left homonymous hemianopsia  MRI  as above   MRA  as above   Carotid Doppler - Bilateral: intimal wall thickening. Mild soft plaque origin ICA. 1-39% ICA stenosis. Unable to visualize right vertebral flow. vertebral flow is antegrade.   2D Echo  pending  LDL 72   HgbA1c 6.2  Subcutaneous heparin for VTE prophylaxis  Diet regular  with thin liquids  no antithrombotic prior to admission, now on aspirin 325 mg orally every day  Patient counseled to be compliant with her antithrombotic medications  Ongoing aggressive stroke risk factor management  Therapy recommendations:  No follow-up PT recommended.  Disposition:  Pending    Hyperlipidemia  Home meds:  Lipitor 10 mg daily resumed in hospital  LDL 72, goal < 70  Will increase Lipitor to 20 mg daily  Continue statin at discharge  Diabetes  HgbA1c pending goal < 7.0  Controlled  Other Stroke Risk Factors   Obesity, Body mass index is 38.74 kg/(m^2).   Migraines  Birth control pills prior to admission - discussed the risks of BCPs with the patient. She will stop taking.  Family history of coagulopathy - will order hypercoagulable panel per Dr. Loretha BrasilZeylikman.  RPR, HIV antibody, C3, C4, CH 50, ANA, and C-reactive protein - already ordered and pending.  Antithrombin III  - 76 (  75-120) ; C-reactive protein - 3.6 (<0.60) ; HCG - <1 (<5)  Other Active Problems  Anemia   Elevated sedimentation rate - 25  Other Pertinent History  Anxiety   Dr Loretha Brasil feels that a catheter cerebral angiogram may be needed for further evaluation of stroke etiology.  Hospital day # 1  Delton See PA-C [facility] Pager [phone] 05/14/2014, 8:14 AM   ? RCVS. S/p d/c birth control. Would consider angiogram in AM to look for RCVS, but I think it is more likely hypercoagulable as there is family hx.  Pauletta Browns   To contact Stroke Continuity provider, please refer to [email or web address]. After hours,  contact General Neurology

## 2014-05-14 NOTE — Progress Notes (Signed)
[facility] PROGRESS NOTE  Angel [name] AVW:[id number] DOB: [birth year] DOA: 05/13/2014 PCP: Altamese CarolinaMARTIN,[name], MD  Assessment/Plan: 1. 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 Neurology following 2. Hypercoag panel ordered, pending 3. Carotid dopplers without stenosis 4. 2d echo pending 5. RPR pending, HIV neg, compliments pending, ANA pending 6. CRP 3.6 7. Discussed with Neurology. There was initial consideration for ? cath angiogram per IR. Will follow further recs 2. Microcytic anemia 1. hgb stable 2. Cont to monitor 3. Headache 1. Resolved 4. DVT prophylaxis 1. Heparin subQ  Code Status: Full Family Communication: Pt in room Disposition Plan: Pending   Consultants:  Neurology  Procedures:    Antibiotics:   (indicate start date, and stop date if known)  HPI/Subjective: No complaints. Eager to go home  Objective: Filed Vitals:   05/13/14 2117 05/14/14 0225 05/14/14 0701 05/14/14 0933  BP: 129/69 134/55 109/55 126/77  Pulse: 76 57 58 91  Temp: 98.6 F (37 C) 98 F (36.7 C) 98.5 F (36.9 C) 98 F (36.7 C)  TempSrc: Oral Oral Oral Oral  Resp: 20 18 18  114  Height:      Weight:      SpO2: 100% 100% 100% 100%   No intake or output data in the 24 hours ending 05/14/14 1313 Filed Weights   05/13/14 0116  Weight: 122.471 kg (270 lb)    Exam:   General:  Awake, in nad  Cardiovascular: regular, s1, s2  Respiratory: normal resp effort, no wheezing  Abdomen: soft,nondistended  Musculoskeletal: perfused, no clubbing   Data Reviewed: Basic Metabolic Panel:  Recent Labs Lab 05/13/14 0233 05/13/14 0339  NA 138 136*  K 4.3 3.9  CL 108 100  CO2  --  22  GLUCOSE 130* 129*  BUN 14 12  CREATININE 0.60 0.76  CALCIUM  --  9.5   Liver Function Tests:  Recent Labs Lab 05/13/14 0339  AST 27  ALT 35  ALKPHOS 79  BILITOT <0.2*  PROT 8.2  ALBUMIN 3.2*   No results for input(s): LIPASE, AMYLASE in the last 168 hours. No results for  input(s): AMMONIA in the last 168 hours. CBC:  Recent Labs Lab 05/13/14 0220 05/13/14 0233  WBC 8.6  --   NEUTROABS 6.4  --   HGB 9.0* 11.6*  HCT 32.1* 34.0*  MCV 73.3*  --   PLT 435*  --    Cardiac Enzymes:  Recent Labs Lab 05/13/14 0820 05/13/14 1830 05/14/14 0112  TROPONINI <0.30 <0.30 <0.30   BNP (last 3 results) No results for input(s): PROBNP in the last 8760 hours. CBG: No results for input(s): GLUCAP in the last 168 hours.  No results found for this or any previous visit (from the past 240 hour(s)).   Studies: Ct Head Wo Contrast  05/13/2014   CLINICAL DATA:  New onset left-sided numbness.  A initial encounter.  EXAM: CT HEAD WITHOUT CONTRAST  TECHNIQUE: Contiguous axial images were obtained from the base of the skull through the vertex without intravenous contrast.  COMPARISON:  Prior MRI from 05/10/2014.  FINDINGS: There is no acute intracranial hemorrhage or infarct. No mass lesion or midline shift. Gray-white matter differentiation is well maintained. Ventricles are normal in size without evidence of hydrocephalus. CSF containing spaces are within normal limits. No extra-axial fluid collection.  The calvarium is intact.  Orbital soft tissues are within normal limits.  The paranasal sinuses and mastoid air cells are well pneumatized and free of fluid.  Scalp soft tissues are  unremarkable.  IMPRESSION: No acute intracranial process. Recently described small infarcts not visualized on this noncontrast head CT.   Electronically Signed   By: Rise Mu M.D.   On: 05/13/2014 03:58   Mr Brain Wo Contrast  05/13/2014   ADDENDUM REPORT: 05/13/2014 13:28  ADDENDUM: Study discussed by telephone with Dr. Loretha Brasil (covering for Dr. Lorretta Harp ) on 05/13/2014 at 1313 hrs.   Electronically Signed   By: Augusto Gamble M.D.   On: 05/13/2014 13:28   05/13/2014   CLINICAL DATA:  23 year old female with progressive chronic headaches. Abnormal brain MRI on 05/10/2014. Initial  encounter.  The patient advises transient left side vision loss 2 weeks ago which improved spontaneously.  EXAM: MRI HEAD WITHOUT CONTRAST  MRA HEAD WITHOUT CONTRAST  TECHNIQUE: Multiplanar, multiecho pulse sequences of the brain and surrounding structures were obtained without intravenous contrast. Angiographic images of the head were obtained using MRA technique without contrast.  COMPARISON:  Brain MRI 05/10/2014.  Head CT 0316 hr today.  FINDINGS: MRI HEAD FINDINGS  Small foci of restricted diffusion re - identified in the posterior hemisphere, including at the right occipital pole and also in the right splenium of the corpus callosum. In this region there is abnormal increased FLAIR and trace diffusion signal in the sulci of the left occipital lobe (series 7, image 14). No associated abnormal subarachnoid signal on other sequences. These findings have not significantly changed since 3 days ago.  No other restricted diffusion identified. Subarachnoid signal elsewhere is within normal limits. Wallace Cullens and white matter signal elsewhere is stable and normal for age. No areas of abnormal susceptibility identified in the brain. No cortical encephalomalacia.  Major intracranial vascular flow voids are stable and within normal limits. No midline shift, mass effect, evidence of mass lesion, or ventriculomegaly. Cervicomedullary junction and pituitary are within normal limits. Negative visualized cervical spine.  Nonspecific decreased T1 bone marrow signal, specially in the visible cervical spine. Visualized scalp soft tissues are within normal limits. Visible internal auditory structures appear normal. Visualized paranasal sinuses and mastoids are clear. Visualized orbit soft tissues are within normal limits.  MRA HEAD FINDINGS  Antegrade flow in the posterior circulation with codominant distal vertebral arteries. Normal right PICA and dominant appearing left AICA. Normal vertebrobasilar junction. Normal basilar artery.  Normal AICA and SCA origins. Fetal type left PCA origin. Normal right PCA origin. Right posterior communicating artery is present.  Both proximal PCA branches are within normal limits, but there is abrupt occlusion of the distal right PCA just at or beyond the P3 segment, it near where the superior an inferior PCA division branches arise. Distal left PCA branches are normal.  Antegrade flow in both ICA siphons. Negative distal cervical ICAs. No siphon stenosis. Ophthalmic and posterior communicating artery origins are normal. Normal carotid termini. Normal MCA and ACA origins. Anterior communicating artery and visualized bilateral ACA branches are within normal limits. Visualized left MCA branches are within normal limits. Visualized right MCA branches are within normal limits. Distal posterior right MCA flow may be mildly hyperdynamic, an compensation for the right PCA findings.  IMPRESSION: 1. Stable abnormal signal in the right occipital lobe and at the right splenium of the corpus callosum since 05/10/2014. Some of the abnormal signal seems confined to the right occipital subarachnoid space as before. 2. However, there is distal right PCA occlusion (right P3 segments). 3. 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 4. Nonetheless, CSF analysis still may be valuable to  confirm No abnormal subarachnoid process. 5. No other MRI or intracranial MRA abnormality identified.  Electronically Signed: By: Augusto GambleLee  Hall M.D. On: 05/13/2014 12:29   Mr Maxine GlennMra Head/brain Wo Cm  05/13/2014   ADDENDUM REPORT: 05/13/2014 13:28  ADDENDUM: Study discussed by telephone with Dr. Loretha BrasilZeylikman (covering for Dr. Lorretta HarpXILIN NIU ) on 05/13/2014 at 1313 hrs.   Electronically Signed   By: Augusto GambleLee  Hall M.D.   On: 05/13/2014 13:28   05/13/2014   CLINICAL DATA:  23 year old female with progressive chronic headaches. Abnormal brain MRI on 05/10/2014. Initial encounter.  The patient advises transient left side vision loss 2  weeks ago which improved spontaneously.  EXAM: MRI HEAD WITHOUT CONTRAST  MRA HEAD WITHOUT CONTRAST  TECHNIQUE: Multiplanar, multiecho pulse sequences of the brain and surrounding structures were obtained without intravenous contrast. Angiographic images of the head were obtained using MRA technique without contrast.  COMPARISON:  Brain MRI 05/10/2014.  Head CT 0316 hr today.  FINDINGS: MRI HEAD FINDINGS  Small foci of restricted diffusion re - identified in the posterior hemisphere, including at the right occipital pole and also in the right splenium of the corpus callosum. In this region there is abnormal increased FLAIR and trace diffusion signal in the sulci of the left occipital lobe (series 7, image 14). No associated abnormal subarachnoid signal on other sequences. These findings have not significantly changed since 3 days ago.  No other restricted diffusion identified. Subarachnoid signal elsewhere is within normal limits. Wallace CullensGray and white matter signal elsewhere is stable and normal for age. No areas of abnormal susceptibility identified in the brain. No cortical encephalomalacia.  Major intracranial vascular flow voids are stable and within normal limits. No midline shift, mass effect, evidence of mass lesion, or ventriculomegaly. Cervicomedullary junction and pituitary are within normal limits. Negative visualized cervical spine.  Nonspecific decreased T1 bone marrow signal, specially in the visible cervical spine. Visualized scalp soft tissues are within normal limits. Visible internal auditory structures appear normal. Visualized paranasal sinuses and mastoids are clear. Visualized orbit soft tissues are within normal limits.  MRA HEAD FINDINGS  Antegrade flow in the posterior circulation with codominant distal vertebral arteries. Normal right PICA and dominant appearing left AICA. Normal vertebrobasilar junction. Normal basilar artery. Normal AICA and SCA origins. Fetal type left PCA origin. Normal right  PCA origin. Right posterior communicating artery is present.  Both proximal PCA branches are within normal limits, but there is abrupt occlusion of the distal right PCA just at or beyond the P3 segment, it near where the superior an inferior PCA division branches arise. Distal left PCA branches are normal.  Antegrade flow in both ICA siphons. Negative distal cervical ICAs. No siphon stenosis. Ophthalmic and posterior communicating artery origins are normal. Normal carotid termini. Normal MCA and ACA origins. Anterior communicating artery and visualized bilateral ACA branches are within normal limits. Visualized left MCA branches are within normal limits. Visualized right MCA branches are within normal limits. Distal posterior right MCA flow may be mildly hyperdynamic, an compensation for the right PCA findings.  IMPRESSION: 1. Stable abnormal signal in the right occipital lobe and at the right splenium of the corpus callosum since 05/10/2014. Some of the abnormal signal seems confined to the right occipital subarachnoid space as before. 2. However, there is distal right PCA occlusion (right P3 segments). 3. 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 4. Nonetheless, CSF analysis still may be valuable to confirm No abnormal subarachnoid process. 5. No other  MRI or intracranial MRA abnormality identified.  Electronically Signed: By: Augusto Gamble M.D. On: 05/13/2014 12:29    Scheduled Meds: .  stroke: mapping our early stages of recovery book   Does not apply Once  . aspirin  300 mg Rectal Daily   Or  . aspirin  325 mg Oral Daily  . atorvastatin  20 mg Oral Daily  . heparin  5,000 Units Subcutaneous 3 times per day   Continuous Infusions:   Principal Problem:   Acute ischemic stroke Active Problems:   Migraine   Morbid obesity   Stroke   Anemia   Left arm numbness  Time spent:  Infiniti Hoefling K  [facility] Pager [phone]. If 7PM-7AM, please contact  night-coverage at [email or web address], password [facility] 05/14/2014, 1:13 PM  LOS: 1 day

## 2014-05-14 NOTE — Progress Notes (Signed)
Patient called Clinical research associatewriter to room again. Patient c/o R sided numbness in arm and leg only. Reported to Dr. Rhona Leavenshiu via Loretha StaplerAmion text.

## 2014-05-14 NOTE — Progress Notes (Signed)
Writer called to patients room. Patient c/o heart racing, sharp R chest pain 9/10, feeling hot and dizzy. VS 155/78 HR 89, RR 20, O2 sat 100%. Text sent to Dr. Rhona Leavenshiu via Loretha StaplerAmion.

## 2014-05-14 NOTE — Evaluation (Signed)
Physical Therapy Evaluation Patient Details Name: Angel [name] MRN: [id number] DOB: October 25, 1990 Today's Date: 05/14/2014   History of Present Illness  Patient is a 23 yo married female with acute onset of vision loss and Lt UE loss in strength.  She began having symptoms 2 weks prior and was recently diagnosed with Rt occipital and corpous callosum CVA, with subsequent Lt homonymous hemianopsia.  PMH also includes migraines and obesity.  Clinical Impression  Patient without mobility deficits or strength deficits.  Mild vision impairment (blurred vision) although no focal deficits noted with cover/uncover test, convergence WNL., peripheral visual field testing WNL.  Min dizziness and blurred vision replicated with gaze stability x1 viewing, therefore verbally provided patient with corrective saccade exercise to perform.  Do not anticipate further need for therapy at this time as she is near baseline with exception of minimal visual impairment.  Advised patient to seek migraine HA diet recommendations per Vestibular [employer] Association (www.[email or web address].org) to manage her symptoms of light sensitivity and dizziness with return demonstration understanding.   Follow Up Recommendations No PT follow up;Supervision - Intermittent    Equipment Recommendations  None recommended by PT    Recommendations for Other Services       Precautions / Restrictions Precautions Precautions: None Restrictions Weight Bearing Restrictions: No      Mobility  Bed Mobility Overal bed mobility: Independent                Transfers Overall transfer level: Independent Equipment used: None                Ambulation/Gait Ambulation/Gait assistance: Independent Ambulation Distance (Feet): 500 Feet Assistive device: None Gait Pattern/deviations: WFL(Within Functional Limits) Gait velocity: decr      Stairs Stairs: Yes Stairs assistance: Modified independent (Device/Increase time) Stair  Management: One rail Right;Alternating pattern Number of Stairs: 30 General stair comments: good safety and foot placement  Wheelchair Mobility    Modified Rankin (Stroke Patients Only) Modified Rankin (Stroke Patients Only) Pre-Morbid Rankin Score: No symptoms Modified Rankin: No significant disability     Balance Overall balance assessment: Modified Independent (tandem 30 sec bil, SLS Rt 28.12s, Lt 22.17 s)                                           Pertinent Vitals/Pain Pain Assessment: No/denies pain    Home Living Family/patient expects to be discharged to:: Private residence Living Arrangements: Spouse/significant other;Children Available Help at Discharge: Family Type of Home: Apartment Home Access: Stairs to enter Entrance Stairs-Rails: Right Entrance Stairs-Number of Steps: 15 Home Layout: Two level;Bed/bath upstairs Home Equipment: None Additional Comments: will have out of town family assist during the holidays ( to arrive Tues). Her mother can assist with childcare while her husband works 2nd shift.    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  She plans to take time off prior to return to her job.     Hand Dominance   Dominant Hand: Right    Extremity/Trunk Assessment   Upper Extremity Assessment: Overall WFL for tasks assessed (4+/5 bil shoulder flexion, abduction, 5/5 elbow flexion/ext)           Lower Extremity Assessment: Overall WFL for tasks assessed (4+/5 bil hip flexors, 5/5 bil knee ext, 4+/5 bil ankle DF)  Cervical / Trunk Assessment: Normal  Communication   Communication: No difficulties  Cognition Arousal/Alertness: Awake/alert Behavior During Therapy: WFL for tasks assessed/performed Overall Cognitive Status: Within Functional Limits for tasks assessed                      General Comments      Exercises         Assessment/Plan    PT Assessment Patent does not need any further PT services  PT Diagnosis Hemiplegia non-dominant side   PT Problem List    PT Treatment Interventions     PT Goals (Current goals can be found in the Care Plan section) Acute Rehab PT Goals Patient Stated Goal: return home with family, improve vision PT Goal Formulation: With patient Time For Goal Achievement: 05/15/14 Potential to Achieve Goals: Good    Frequency     Barriers to discharge        Co-evaluation               End of Session   Activity Tolerance: Patient tolerated treatment well Patient left: in bed;with call bell/phone within reach Nurse Communication: Mobility status         Time: [id number] PT Time Calculation (min) (ACUTE ONLY): 49 min   Charges:   PT Evaluation $Initial PT Evaluation Tier I: 1 Procedure PT Treatments $Gait Training: 8-22 mins $Neuromuscular Re-education: 8-22 mins   PT G CodesNestor Lewandowsky:         Chrisopher Pustejovsky M Lorane Cousar, PT [phone] Alessa Mazur 05/14/2014, 2:54 PM

## 2014-05-14 NOTE — Progress Notes (Addendum)
1950 pt complained of sharp chest pain on the right side, numbness of face, hands, and foot, as well as pt c/o headache with stabbing pain/pressure. Pt has just recently complained of left sided numbness that had resolved around 1700.  Completed assessment and pt did not seem in distress and  v/s were all WNL.  Administered 1 nitro tablet (pain 9 out of 10) and waited with the patient for 3 mins, when relief was noted and pt stated pain at a 3.   Dr. Claiborne Billingsallahan called back and ordered in EKG, and stated that the patient needs to be seen by a physician, however [facility] was [facility] so I paged Dr. Emilia Becksmond at [phone] at 8:58 to come and see the patient.

## 2014-05-15 ENCOUNTER — Ambulatory Visit (INDEPENDENT_AMBULATORY_CARE_PROVIDER_SITE_OTHER): Payer: Self-pay | Admitting: Neurology

## 2014-05-15 ENCOUNTER — Inpatient Hospital Stay (HOSPITAL_COMMUNITY): Payer: Medicaid Other

## 2014-05-15 ENCOUNTER — Encounter (HOSPITAL_COMMUNITY): Payer: Self-pay | Admitting: Radiology

## 2014-05-15 DIAGNOSIS — I776 Arteritis, unspecified: Secondary | ICD-10-CM

## 2014-05-15 DIAGNOSIS — I499 Cardiac arrhythmia, unspecified: Secondary | ICD-10-CM

## 2014-05-15 DIAGNOSIS — D6852 Prothrombin gene mutation: Secondary | ICD-10-CM

## 2014-05-15 DIAGNOSIS — I639 Cerebral infarction, unspecified: Secondary | ICD-10-CM

## 2014-05-15 DIAGNOSIS — R0989 Other specified symptoms and signs involving the circulatory and respiratory systems: Secondary | ICD-10-CM

## 2014-05-15 DIAGNOSIS — I6340 Cerebral infarction due to embolism of unspecified cerebral artery: Secondary | ICD-10-CM

## 2014-05-15 DIAGNOSIS — E785 Hyperlipidemia, unspecified: Secondary | ICD-10-CM | POA: Insufficient documentation

## 2014-05-15 LAB — CSF CELL COUNT WITH DIFFERENTIAL
RBC Count, CSF: 1225 /mm3 — ABNORMAL HIGH
RBC Count, CSF: 29 /mm3 — ABNORMAL HIGH
TUBE #: 4
Tube #: 1
WBC CSF: 4 /mm3 (ref 0–5)
WBC, CSF: 2 /mm3 (ref 0–5)

## 2014-05-15 LAB — LUPUS ANTICOAGULANT PANEL
DRVVT: 42.2 secs (ref <42.9)
LUPUS ANTICOAGULANT: NOT DETECTED
PTT Lupus Anticoagulant: 36.6 secs (ref 28.0–43.0)

## 2014-05-15 LAB — PROTEIN AND GLUCOSE, CSF
GLUCOSE CSF: 68 mg/dL (ref 43–76)
Total  Protein, CSF: 15 mg/dL (ref 15–45)

## 2014-05-15 LAB — CRYPTOCOCCAL ANTIGEN, CSF: Crypto Ag: NEGATIVE

## 2014-05-15 LAB — TROPONIN I

## 2014-05-15 LAB — PROTEIN S ACTIVITY: PROTEIN S ACTIVITY: 39 % — AB (ref 69–129)

## 2014-05-15 LAB — D-DIMER, QUANTITATIVE (NOT AT ARMC): D DIMER QUANT: 7.43 ug{FEU}/mL — AB (ref 0.00–0.48)

## 2014-05-15 LAB — GRAM STAIN

## 2014-05-15 LAB — PROTEIN C, TOTAL: Protein C, Total: 62 % — ABNORMAL LOW (ref 72–160)

## 2014-05-15 LAB — PROTEIN S, TOTAL: Protein S Ag, Total: 51 % — ABNORMAL LOW (ref 60–150)

## 2014-05-15 LAB — PROTEIN C ACTIVITY: Protein C Activity: 88 % (ref 75–133)

## 2014-05-15 LAB — ANA: Anti Nuclear Antibody(ANA): NEGATIVE

## 2014-05-15 MED ORDER — GADOBENATE DIMEGLUMINE 529 MG/ML IV SOLN
20.0000 mL | Freq: Once | INTRAVENOUS | Status: AC | PRN
  Administered 2014-05-15: 20 mL via INTRAVENOUS

## 2014-05-15 MED ORDER — LIDOCAINE HCL (PF) 1 % IJ SOLN
INTRAMUSCULAR | Status: AC
  Filled 2014-05-15: qty 5

## 2014-05-15 MED ORDER — HEPARIN SODIUM (PORCINE) 5000 UNIT/ML IJ SOLN
5000.0000 [IU] | Freq: Three times a day (TID) | INTRAMUSCULAR | Status: DC
  Administered 2014-05-15 – 2014-05-17 (×4): 5000 [IU] via SUBCUTANEOUS
  Filled 2014-05-15 (×4): qty 1

## 2014-05-15 NOTE — Progress Notes (Signed)
Shift Event: Rn paged secondary to pt c/o's of left sided chest pain, numbness to left face/ bilat LE, and headache. Cp relieved with one Nitro. At bedside, pt is in NAD, heart- RRR; Lungs- CTAB; Neuro-No focal deficits, 5/5 strength of all extremities; MSK- no swelling, no pulses palpated in BLE.  Chest pain -Pt with hx of acid reflux,and states chest pain similar to acid reflux in past. Denies radation of pain or sob. CP relieved with one Nitro. Pt with similar complaint of cp yesterday, troponin and EKG were WNL. -Rpeart EKG- -Troponin X1 negative- will obtain serial troponins -Maalox/Mylanta PRN   LE numbness/pain -pt with LLE numbness, and RLE area of pain, no swelling. Pt with high risk of DVT giving setting of CVA and family history of hypercoag state. -Unable to obtain pulses via doppler bilaterally -Stat Ddimer elevated -Bilateral venous duplex pending   Illa LevelSahar Osman [facility] [phone]

## 2014-05-15 NOTE — Progress Notes (Signed)
OT Cancellation Note  Patient Details Name: Angel [name] MRN: [id number] DOB: 25-Jan-1991   Cancelled Treatment:    Reason Eval/Treat Not Completed: Patient at procedure or test/ unavailable (OOR at 13:00)  Harolyn RutherfordJones, Ovide Dusek B  Pager: [phone]  05/15/2014, 4:09 PM

## 2014-05-15 NOTE — Consult Note (Signed)
Chief Complaint: Chief Complaint  Patient presents with  . Numbness  New CVA  Referring Physician(s): Dr Roda Shutters- Neuro  History of Present Illness: Angel [name] is a 23 y.o. female  Pt with Hx Migraines Presents to ED with headache L facial numbness B lower extr numbness: sxs x 2 weeks Now with Upper and lower numbness B; comes and goes New Dx CVA per MRI 05/10/14 Dr Roda Shutters has requested cerebral arteriogram Dr Corliss Skains has reviewed imaging  I have seen and examined pt Now scheduled for Cerebral Arteriogram 12/22   Past Medical History  Diagnosis Date  . Anemia   . Headache   . Obesity   . Anxiety     Past Surgical History  Procedure Laterality Date  . None      Allergies: Review of patient's allergies indicates no known allergies.  Medications: Prior to Admission medications   Medication Sig Start Date End Date Taking? Authorizing Provider  naproxen (NAPROSYN) 500 MG tablet Take 1 tablet (500 mg total) by mouth 2 (two) times daily with a meal. 05/01/14  Yes Einar Gip Dansie, PA-C  norgestimate-ethinyl estradiol (ORTHO-CYCLEN,SPRINTEC,PREVIFEM) 0.25-35 MG-MCG tablet Take 1 tablet by mouth daily.   Yes Historical Provider, MD  ondansetron (ZOFRAN ODT) 4 MG disintegrating tablet Take 1 tablet (4 mg total) by mouth every 8 (eight) hours as needed for nausea or vomiting. 05/01/14  Yes Einar Gip Dansie, PA-C  phenylephrine (SUDAFED PE) 10 MG TABS tablet Take 10 mg by mouth every 4 (four) hours as needed. Congestion   Yes Historical Provider, MD  sodium chloride (OCEAN) 0.65 % SOLN nasal spray Place 1 spray into both nostrils daily as needed for congestion.   Yes Historical Provider, MD  atorvastatin (LIPITOR) 10 MG tablet Take 1 tablet (10 mg total) by mouth daily. 05/12/14   Anson Fret, MD  eletriptan (RELPAX) 40 MG tablet Take 1 tablet (40 mg total) by mouth as needed for migraine or headache. One tablet by mouth at onset of headache. May repeat in 2 hours if  headache persists or recurs. Patient not taking: Reported on 05/13/2014 03/13/14   Anson Fret, MD  topiramate (TOPAMAX) 50 MG tablet Take 1 tablet (50 mg total) by mouth 2 (two) times daily. Patient not taking: Reported on 05/13/2014 03/31/14   Anson Fret, MD    Family History  Problem Relation Age of Onset  . Cancer Paternal Uncle   . Diabetes Paternal Uncle   . Heart disease Maternal Grandmother   . Heart disease Maternal Grandfather   . Diabetes Paternal Grandmother     History   Social History  . Marital Status: Single    Spouse Name: N/A    Number of Children: 1  . Years of Education: college   Occupational History  .  Other    [employer]   Social History Main Topics  . Smoking status: Never Smoker   . Smokeless tobacco: Never Used  . Alcohol Use: No  . Drug Use: No  . Sexual Activity: Yes    Birth Control/ Protection: Pill   Other Topics Concern  . None   Social History Narrative   Patient lives at home with family.   Caffeine Use: 2 sodas daily     Review of Systems: A 12 point ROS discussed and pertinent positives are indicated in the HPI above.  All other systems are negative.  Review of Systems  Constitutional: Positive for activity change and fatigue. Negative for fever.  HENT: Negative for hearing loss and tinnitus.   Respiratory: Negative for cough and shortness of breath.   Cardiovascular: Negative for chest pain.  Gastrointestinal: Negative for abdominal pain.  Genitourinary: Negative for difficulty urinating.  Musculoskeletal: Positive for gait problem. Negative for back pain.  Neurological: Positive for weakness, light-headedness, numbness and headaches. Negative for dizziness, tremors, seizures, syncope, facial asymmetry and speech difficulty.  Psychiatric/Behavioral: Negative for behavioral problems and confusion.    Vital Signs: BP 118/61 mmHg  Pulse 79  Temp(Src) 98.3 F (36.8 C) (Oral)  Resp 20  Ht 5\' 10"  (1.778  m)  Wt 122.471 kg (270 lb)  BMI 38.74 kg/m2  SpO2 100%  LMP 04/29/2014  Physical Exam  Constitutional: She appears well-nourished.  Neck: Normal range of motion.  Cardiovascular: Normal rate and regular rhythm.   No murmur heard. Pulmonary/Chest: Effort normal and breath sounds normal. She has no wheezes.  Abdominal: Soft. Bowel sounds are normal. There is no tenderness.  Musculoskeletal: Normal range of motion.  Neurological: She is alert.  Skin: Skin is warm.  Psychiatric: She has a normal mood and affect. Her behavior is normal. Judgment and thought content normal.    Imaging: Ct Head Wo Contrast  05/14/2014   CLINICAL DATA:  New onset right arm and leg tingling and numbness. Patient admitted with left arm and leg paresthesias.  EXAM: CT HEAD WITHOUT CONTRAST  TECHNIQUE: Contiguous axial images were obtained from the base of the skull through the vertex without intravenous contrast.  COMPARISON:  Prior MRI from 05/13/2014.  FINDINGS: Previously identified small ischemic infarcts involving the right PCA territory and splenium of the corpus callosum are grossly stable, although not well evaluated on this noncontrast CT. No new large vessel territory infarct identified. Gray-white matter differentiation is maintained. No intracranial hemorrhage. No extra-axial fluid collection.  No mass lesion, mass effect, or midline shift. No hydrocephalus. No extra-axial fluid collection.  Scalp soft tissues within normal limits. Calvarium intact. No acute abnormality seen about the orbits.  Paranasal sinuses and mastoid air cells are clear.  IMPRESSION: 1. No new intracranial process identified. 2. Grossly stable known ischemic infarcts involving the right PCA territory and splenium.   Electronically Signed   By: Rise Mu M.D.   On: 05/14/2014 19:20   Ct Head Wo Contrast  05/13/2014   CLINICAL DATA:  New onset left-sided numbness.  A initial encounter.  EXAM: CT HEAD WITHOUT CONTRAST   TECHNIQUE: Contiguous axial images were obtained from the base of the skull through the vertex without intravenous contrast.  COMPARISON:  Prior MRI from 05/10/2014.  FINDINGS: There is no acute intracranial hemorrhage or infarct. No mass lesion or midline shift. Gray-white matter differentiation is well maintained. Ventricles are normal in size without evidence of hydrocephalus. CSF containing spaces are within normal limits. No extra-axial fluid collection.  The calvarium is intact.  Orbital soft tissues are within normal limits.  The paranasal sinuses and mastoid air cells are well pneumatized and free of fluid.  Scalp soft tissues are unremarkable.  IMPRESSION: No acute intracranial process. Recently described small infarcts not visualized on this noncontrast head CT.   Electronically Signed   By: Rise Mu M.D.   On: 05/13/2014 03:58   Mr Brain Wo Contrast  05/13/2014   ADDENDUM REPORT: 05/13/2014 13:28  ADDENDUM: Study discussed by telephone with Dr. Loretha Brasil (covering for Dr. Lorretta Harp ) on 05/13/2014 at 1313 hrs.   Electronically Signed   By: Augusto Gamble M.D.   On:  05/13/2014 13:28   05/13/2014   CLINICAL DATA:  23 year old female with progressive chronic headaches. Abnormal brain MRI on 05/10/2014. Initial encounter.  The patient advises transient left side vision loss 2 weeks ago which improved spontaneously.  EXAM: MRI HEAD WITHOUT CONTRAST  MRA HEAD WITHOUT CONTRAST  TECHNIQUE: Multiplanar, multiecho pulse sequences of the brain and surrounding structures were obtained without intravenous contrast. Angiographic images of the head were obtained using MRA technique without contrast.  COMPARISON:  Brain MRI 05/10/2014.  Head CT 0316 hr today.  FINDINGS: MRI HEAD FINDINGS  Small foci of restricted diffusion re - identified in the posterior hemisphere, including at the right occipital pole and also in the right splenium of the corpus callosum. In this region there is abnormal increased FLAIR  and trace diffusion signal in the sulci of the left occipital lobe (series 7, image 14). No associated abnormal subarachnoid signal on other sequences. These findings have not significantly changed since 3 days ago.  No other restricted diffusion identified. Subarachnoid signal elsewhere is within normal limits. Wallace CullensGray and white matter signal elsewhere is stable and normal for age. No areas of abnormal susceptibility identified in the brain. No cortical encephalomalacia.  Major intracranial vascular flow voids are stable and within normal limits. No midline shift, mass effect, evidence of mass lesion, or ventriculomegaly. Cervicomedullary junction and pituitary are within normal limits. Negative visualized cervical spine.  Nonspecific decreased T1 bone marrow signal, specially in the visible cervical spine. Visualized scalp soft tissues are within normal limits. Visible internal auditory structures appear normal. Visualized paranasal sinuses and mastoids are clear. Visualized orbit soft tissues are within normal limits.  MRA HEAD FINDINGS  Antegrade flow in the posterior circulation with codominant distal vertebral arteries. Normal right PICA and dominant appearing left AICA. Normal vertebrobasilar junction. Normal basilar artery. Normal AICA and SCA origins. Fetal type left PCA origin. Normal right PCA origin. Right posterior communicating artery is present.  Both proximal PCA branches are within normal limits, but there is abrupt occlusion of the distal right PCA just at or beyond the P3 segment, it near where the superior an inferior PCA division branches arise. Distal left PCA branches are normal.  Antegrade flow in both ICA siphons. Negative distal cervical ICAs. No siphon stenosis. Ophthalmic and posterior communicating artery origins are normal. Normal carotid termini. Normal MCA and ACA origins. Anterior communicating artery and visualized bilateral ACA branches are within normal limits. Visualized left MCA  branches are within normal limits. Visualized right MCA branches are within normal limits. Distal posterior right MCA flow may be mildly hyperdynamic, an compensation for the right PCA findings.  IMPRESSION: 1. Stable abnormal signal in the right occipital lobe and at the right splenium of the corpus callosum since 05/10/2014. Some of the abnormal signal seems confined to the right occipital subarachnoid space as before. 2. However, there is distal right PCA occlusion (right P3 segments). 3. 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 4. Nonetheless, CSF analysis still may be valuable to confirm No abnormal subarachnoid process. 5. No other MRI or intracranial MRA abnormality identified.  Electronically Signed: By: Augusto GambleLee  Hall M.D. On: 05/13/2014 12:29   Mr Brain Wo Contrast  05/11/2014   [facility]  NEUROIMAGING REPORT   STUDY DATE: 05/10/14 PATIENT NAME: Angel ShoreKhamilah Allen DOB: 1991-02-10 MRN: [id number]  ORDERING CLINICIAN: Naomie DeanAntonia Ahern, MD  CLINICAL HISTORY: 23 year old female with right sided headaches and  blurred vision. History of migraine headaches.  EXAM: MRI  brain (without)  TECHNIQUE: MRI of the brain without contrast was obtained utilizing 5 mm  axial slices with T1, T2, T2 flair, SWI and diffusion weighted views.  T1  sagittal and T2 coronal views were obtained. CONTRAST: no IMAGING SITE: [facility] [address] (1.5 Tesla MRI)    FINDINGS:  There are several foci of DWI hyperintensity in the right splenium of  corpus callosum (series 3 image 71) and right occipital lobe (series 3  image 66) which are also hypointense on ADC maps. There is abnormal DWI  hyperintense signal over the cortical surface of the posterior and mesial  right occipital lobe, some of which is also hypointense on ADC maps. There  is associated sulcal T2FLAIR hyperintense signal in the right occipital  lobe, without evidence of subarachnoid hemorrhage on SWI or T1  views.  Elsewhere there are a few scattered punctate subcortical T2  hyperintensities in the centrum semiovale.   The remaining cortical sulci, fissures and cisterns are normal in size and  appearance. Lateral, third and fourth ventricle are normal in size and  appearance. No extra-axial fluid collections are seen. No evidence of mass  effect or midline shift.    On sagittal views the posterior fossa, pituitary gland and corpus callosum  are unremarkable. No evidence of intracranial hemorrhage on SWI views. The  orbits and their contents, paranasal sinuses and calvarium are  unremarkable.  Intracranial flow voids are present.    05/11/2014   Abnormal MRI brain (without) demonstrating: - Right occipital lobe sulcal T2FLAIR hyperintensity without associated  hemorrhage on SWI views. There are 2 small acute infarcts in the right  splenium of corpus callosum and right occipital lobe. Elsewhere there are  a few scattered punctate subcortical T2 hyperintensities in the centrum  semiovale. Main considerations would include a variety cerebral  vasoconstriction syndromes, some of which may be reversible and others  which may be progressive/permanent. These may include vasculitidies  (primary CNS angitis, giant cell arteritis, granulomatous disease, other  systemic rheumatologic diseases), hypercoagulable states (autoimmune or  paraneoplastic), infectious processes (meningo-encephalitis), subarachnoid  hemorrhage, as well a group of conditions associated with so called  reversible cerebral vasoconstriction syndrome (RCVS).  - Correlation with CSF and serum analysis, follow up post-contrast imaging  and angiography, and clinical findings advised.     INTERPRETING PHYSICIAN:  Suanne MarkerVIKRAM R. PENUMALLI, MD Certified in Neurology, Neurophysiology and Neuroimaging  [facility] [address], [address] [place], [place] [id number] [phone]   Mr Maxine GlennMra Head/brain [place] Cm  05/13/2014   ADDENDUM REPORT: 05/13/2014 13:28   ADDENDUM: Study discussed by telephone with Dr. Loretha BrasilZeylikman (covering for Dr. Lorretta HarpXILIN NIU ) on 05/13/2014 at 1313 hrs.   Electronically Signed   By: Augusto GambleLee  Hall M.D.   On: 05/13/2014 13:28   05/13/2014   CLINICAL DATA:  23 year old female with progressive chronic headaches. Abnormal brain MRI on 05/10/2014. Initial encounter.  The patient advises transient left side vision loss 2 weeks ago which improved spontaneously.  EXAM: MRI HEAD WITHOUT CONTRAST  MRA HEAD WITHOUT CONTRAST  TECHNIQUE: Multiplanar, multiecho pulse sequences of the brain and surrounding structures were obtained without intravenous contrast. Angiographic images of the head were obtained using MRA technique without contrast.  COMPARISON:  Brain MRI 05/10/2014.  Head CT 0316 hr today.  FINDINGS: MRI HEAD FINDINGS  Small foci of restricted diffusion re - identified in the posterior hemisphere, including at the right occipital pole and also in the right splenium of the corpus callosum.  In this region there is abnormal increased FLAIR and trace diffusion signal in the sulci of the left occipital lobe (series 7, image 14). No associated abnormal subarachnoid signal on other sequences. These findings have not significantly changed since 3 days ago.  No other restricted diffusion identified. Subarachnoid signal elsewhere is within normal limits. Wallace Cullens and white matter signal elsewhere is stable and normal for age. No areas of abnormal susceptibility identified in the brain. No cortical encephalomalacia.  Major intracranial vascular flow voids are stable and within normal limits. No midline shift, mass effect, evidence of mass lesion, or ventriculomegaly. Cervicomedullary junction and pituitary are within normal limits. Negative visualized cervical spine.  Nonspecific decreased T1 bone marrow signal, specially in the visible cervical spine. Visualized scalp soft tissues are within normal limits. Visible internal auditory structures appear normal. Visualized  paranasal sinuses and mastoids are clear. Visualized orbit soft tissues are within normal limits.  MRA HEAD FINDINGS  Antegrade flow in the posterior circulation with codominant distal vertebral arteries. Normal right PICA and dominant appearing left AICA. Normal vertebrobasilar junction. Normal basilar artery. Normal AICA and SCA origins. Fetal type left PCA origin. Normal right PCA origin. Right posterior communicating artery is present.  Both proximal PCA branches are within normal limits, but there is abrupt occlusion of the distal right PCA just at or beyond the P3 segment, it near where the superior an inferior PCA division branches arise. Distal left PCA branches are normal.  Antegrade flow in both ICA siphons. Negative distal cervical ICAs. No siphon stenosis. Ophthalmic and posterior communicating artery origins are normal. Normal carotid termini. Normal MCA and ACA origins. Anterior communicating artery and visualized bilateral ACA branches are within normal limits. Visualized left MCA branches are within normal limits. Visualized right MCA branches are within normal limits. Distal posterior right MCA flow may be mildly hyperdynamic, an compensation for the right PCA findings.  IMPRESSION: 1. Stable abnormal signal in the right occipital lobe and at the right splenium of the corpus callosum since 05/10/2014. Some of the abnormal signal seems confined to the right occipital subarachnoid space as before. 2. However, there is distal right PCA occlusion (right P3 segments). 3. 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 4. Nonetheless, CSF analysis still may be valuable to confirm No abnormal subarachnoid process. 5. No other MRI or intracranial MRA abnormality identified.  Electronically Signed: By: Augusto Gamble M.D. On: 05/13/2014 12:29    Labs:  CBC:  Recent Labs  05/13/14 0220 05/13/14 0233  WBC 8.6  --   HGB 9.0* 11.6*  HCT 32.1* 34.0*  PLT 435*  --      COAGS:  Recent Labs  05/13/14 0339  INR 1.07  APTT 27    BMP:  Recent Labs  05/13/14 0233 05/13/14 0339  NA 138 136*  K 4.3 3.9  CL 108 100  CO2  --  22  GLUCOSE 130* 129*  BUN 14 12  CALCIUM  --  9.5  CREATININE 0.60 0.76  GFRNONAA  --  >90  GFRAA  --  >90    LIVER FUNCTION TESTS:  Recent Labs  05/13/14 0339  BILITOT <0.2*  AST 27  ALT 35  ALKPHOS 79  PROT 8.2  ALBUMIN 3.2*    TUMOR MARKERS: No results for input(s): AFPTM, CEA, CA199, CHROMGRNA in the last 8760 hours.  Assessment and Plan:  Pt with Hx migraine headaches Develops new L facial numbness; B LE numbness New CVA per MRI 05/10/14  Now scheduled for cerebral arteriogram per Neurology Pt aware of procedure benefits and risks and agreeable to proceed Consent signed andin chart Hep Inj held 12/22  Thank you for this interesting consult.  I greatly enjoyed meeting Danett Palazzo and look forward to participating in their care.    I spent a total of 40 minutes face to face in clinical consultation, greater than 50% of which was counseling/coordinating care for Cerebral arteriogram  Signed: TURPIN,PAMELA A 05/15/2014, 1:13 PM

## 2014-05-15 NOTE — Progress Notes (Addendum)
VASCULAR LAB PRELIMINARY  -ABI completed   RIGHT    LEFT    PRESSURE WAVEFORM  PRESSURE WAVEFORM  BRACHIAL 147 Triphasic BRACHIAL 149 Triphasic  DP 138 Triphasic DP 116 Triphasic  AT   AT    PT 137 Triphasic PT 139 Triphasic  PER   PER    GREAT TOE  NA GREAT TOE  NA    RIGHT LEFT  ABI 0.93 0.93   ABI is within normal limits bilaterally.   -Bilateral lower extremity venous duplex completed. Bilateral lower extremities are negative for deep vein thrombosis. There is no evidence of Baker's cyst bilaterally.   -Transcranial Doppler with Bubbles has been completed.  Per Dr. Roda ShuttersXu, no High Intensity Transient Signals were insonated.   05/15/2014 11:30 AM Gertie FeyMichelle Lige Lakeman, RVT, RDCS, RDMS

## 2014-05-15 NOTE — Progress Notes (Addendum)
STROKE TEAM PROGRESS NOTE   HISTORY Angel [name] is an 23 y.o. female with a past medical history significant for episodic migraine, obesity, and anemia, transferred to [facility] from [facility] emergency department for further evaluation of new onset left-sided numbness and abnormal MRI. She indicated that she saw her neurologist couple of weeks ago due to worsening migraines and brain MRI was requested was completed 2 days ago and she was called last and informed that the MRI was positive for stroke, and asked to come to the ED if new symptoms developed. Patient indicated that last night she had acute onset of numbness involving left face-arm-leg and she presented to [facility]ED. Complains of a mild HA but denies vertigo, double vision, difficulty swallowing, imbalance, focal weakness, slurred speech, language difficulty, or bladder impairment. York Spaniel that couple weeks ago she had a transient episode of painless visual loss left eye and still having blurred vision.   Date last known well: 05/12/14 Time last known well: uncertain tPA Given: no, out of the window  She reports that there is a family history of coagulopathy with multiple members of her family on anticoagulation including her mother.  SUBJECTIVE (INTERVAL HISTORY) Family members present. The patient feels she is improving but is not 100% yet.   No issues overnight as per nursing staff.    OBJECTIVE Temp:  [97.7 F (36.5 C)-98.5 F (36.9 C)] 98.3 F (36.8 C) (12/21 0923) Pulse Rate:  [66-94] 79 (12/21 0923) Cardiac Rhythm:  [-] Normal sinus rhythm (12/21 0000) Resp:  [18-20] 20 (12/21 0923) BP: (104-155)/(54-79) 118/61 mmHg (12/21 0923) SpO2:  [100 %] 100 % (12/21 0923)  No results for input(s): GLUCAP in the last 168 hours.  Recent Labs Lab 05/13/14 0233 05/13/14 0339  NA 138 136*  K 4.3 3.9  CL 108 100  CO2  --  22  GLUCOSE 130* 129*  BUN 14 12  CREATININE 0.60 0.76  CALCIUM  --  9.5    Recent Labs Lab  05/13/14 0339  AST 27  ALT 35  ALKPHOS 79  BILITOT <0.2*  PROT 8.2  ALBUMIN 3.2*    Recent Labs Lab 05/13/14 0220 05/13/14 0233  WBC 8.6  --   NEUTROABS 6.4  --   HGB 9.0* 11.6*  HCT 32.1* 34.0*  MCV 73.3*  --   PLT 435*  --     Recent Labs Lab 05/13/14 1830 05/14/14 0112 05/14/14 2246 05/15/14 0629 05/15/14 1203  TROPONINI <0.30 <0.30 <0.30 <0.30 <0.30    Recent Labs  05/13/14 0339  LABPROT 14.0  INR 1.07    Recent Labs  05/13/14 0453  COLORURINE YELLOW  LABSPEC 1.030  PHURINE 5.5  GLUCOSEU 100*  HGBUR NEGATIVE  BILIRUBINUR NEGATIVE  KETONESUR NEGATIVE  PROTEINUR NEGATIVE  UROBILINOGEN 0.2  NITRITE NEGATIVE  LEUKOCYTESUR NEGATIVE       Component Value Date/Time   CHOL 142 05/13/2014 0820   TRIG 77 05/13/2014 0820   HDL 55 05/13/2014 0820   CHOLHDL 2.6 05/13/2014 0820   VLDL 15 05/13/2014 0820   LDLCALC 72 05/13/2014 0820   Lab Results  Component Value Date   HGBA1C 6.2* 05/13/2014      Component Value Date/Time   LABOPIA NONE DETECTED 05/13/2014 0453   COCAINSCRNUR NONE DETECTED 05/13/2014 0453   LABBENZ NONE DETECTED 05/13/2014 0453   AMPHETMU NONE DETECTED 05/13/2014 0453   THCU NONE DETECTED 05/13/2014 0453   LABBARB NONE DETECTED 05/13/2014 0453    No results for input(s): ETH in  the last 168 hours.  I have personally reviewed the radiological images below and agree with the radiology interpretations.  Ct Head Wo Contrast 05/13/2014    No acute intracranial process. Recently described small infarcts not visualized on this noncontrast head CT.     MRI brain 12/16 Right occipital lobe sulcal T2FLAIR hyperintensity without associated hemorrhage on SWI views. 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 Elsewhere there are a few scattered punctate subcortical T2 hyperintensities in the centrum semiovale. Main considerations would include a variety cerebral vasoconstriction  syndromes, some of which may be reversible and others which may be progressive/permanent. These may include vasculitidies (primary CNS angitis, giant cell arteritis, granulomatous disease, other systemic rheumatologic diseases), hypercoagulable states (autoimmune or paraneoplastic), infectious processes (meningo-encephalitis), subarachnoid hemorrhage, as well a group of conditions associated with so called reversible cerebral vasoconstriction syndrome (RCVS).   MRI / MRA Brain Wo Contrast 05/13/14 1. Stable abnormal signal in the right occipital lobe and at the right splenium of the corpus callosum since 05/10/2014. Some of the abnormal signal seems confined to the right occipital subarachnoid space as before. 2. However, there is distal right PCA occlusion (right P3 segments).  3. 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  4. Nonetheless, CSF analysis still may be valuable to confirm No abnormal subarachnoid process.  5. No other MRI or intracranial MRA abnormality identified.     MRI with contrast 05/15/14 Gyriform enhancement in the right medial occipital lobe most consistent with subacute infarct. This area is larger than that seen on diffusion-weighted imaging recently.  CUS - Bilateral: intimal wall thickening. Mild soft plaque origin ICA. 1-39% ICA stenosis. Unable to visualize right vertebral flow. Left vertebral flow is antegrade. ICA/CCA ratio: R-0.89 L-0.63.  TCD bubble study - negative  LE venous doppler - negative  Component     Latest Ref Rng 05/13/2014 05/14/2014           Tube #         Color, CSF     COLORLESS    Appearance, CSF     CLEAR    Supernatant         RBC Count, CSF     0 /cu mm    WBC, CSF     0 - 5 /cu mm    Segmented Neutrophils-CSF     0 - 6 %    Lymphs, CSF     40 - 80 %    Monocyte-Macrophage-Spinal Fluid     15 - 45 %    Other Cells, CSF         PTT Lupus Anticoagulant     28.0 - 43.0 secs  36.6  PTTLA  Confirmation     <8.0 secs  NOT APPL  PTTLA 4:1 Mix     28.0 - 43.0 secs  NOT APPL  DRVVT     <42.9 secs  42.2  Drvvt confirmation     <1.15 Ratio  NOT APPL  dRVVT Incubated 1:1 Mix     <42.9 secs  NOT APPL  Lupus Anticoagulant     NOT DETECTED  NOT DETECTED  Cholesterol     0 - 200 mg/dL 161   Triglycerides     <150 mg/dL 77   HDL     >09 mg/dL 55   Total CHOL/HDL Ratio      2.6   VLDL     0 -  40 mg/dL 15   LDL (calc)     0 - 99 mg/dL 72   Specimen Description         Special Requests         Gram Stain         Report Status         Anticardiolipin IgG     <23 GPL U/mL  8 (L)  Anticardiolipin IgM     <11 MPL U/mL  5 (L)  Anticardiolipin IgA     <22 APL U/mL  8 (L)  Hgb A1c MFr Bld     <5.7 % 6.2 (H)   Mean Plasma Glucose     <117 mg/dL 161 (H)   Glucose, CSF     43 - 76 mg/dL    Total  Protein, CSF     15 - 45 mg/dL    Crypto Ag     NEGATIVE    Cryptococcal Ag Titer     NOT INDICATED    ANA Ser Ql     NEGATIVE NEGATIVE   Sed Rate     0 - 22 mm/hr 25 (H)   CRP     <0.60 mg/dL 3.6 (H)   Vitamin W-96     211 - 911 pg/mL  249  RPR     NON REAC  NON REAC  HIV     NONREACTIVE  NONREACTIVE  AntiThromb III Func     75 - 120 %  76  Protein C Activity     75 - 133 %  88  Protein C, Total     72 - 160 %  62 (L)  Protein S Activity     69 - 129 %  39 (L)  Protein S Ag, Total     60 - 150 %  51 (L)  Homocysteine     4.0 - 15.4 umol/L  7.5   Component     Latest Ref Rng 05/15/2014         4:04 PM  Tube #      4  Color, CSF     COLORLESS COLORLESS  Appearance, CSF     CLEAR CLEAR  Supernatant      NOT INDICATED  RBC Count, CSF     0 /cu mm 29 (H)  WBC, CSF     0 - 5 /cu mm 2  Segmented Neutrophils-CSF     0 - 6 % RARE  Lymphs, CSF     40 - 80 % FEW  Monocyte-Macrophage-Spinal Fluid     15 - 45 % RARE  Other Cells, CSF      TOO FEW TO COUNT, SMEAR AVAILABLE FOR REVIEW  PTT Lupus Anticoagulant     28.0 - 43.0 secs   PTTLA  Confirmation     <8.0 secs   PTTLA 4:1 Mix     28.0 - 43.0 secs   DRVVT     <42.9 secs   Drvvt confirmation     <1.15 Ratio   dRVVT Incubated 1:1 Mix     <42.9 secs   Lupus Anticoagulant     NOT DETECTED   Cholesterol     0 - 200 mg/dL   Triglycerides     <045 mg/dL   HDL     >40 mg/dL   Total CHOL/HDL Ratio        VLDL     0 - 40 mg/dL   LDL (calc)     0 -  99 mg/dL   Specimen Description      CSF  Special Requests      NONE  Gram Stain      CYTOSPIN PREP . . .  Report Status      05/15/2014 FINAL  Anticardiolipin IgG     <23 GPL U/mL   Anticardiolipin IgM     <11 MPL U/mL   Anticardiolipin IgA     <22 APL U/mL   Hgb A1c MFr Bld     <5.7 %   Mean Plasma Glucose     <117 mg/dL   Glucose, CSF     43 - 76 mg/dL 68  Total  Protein, CSF     15 - 45 mg/dL 15  Crypto Ag     NEGATIVE NEGATIVE  Cryptococcal Ag Titer     NOT INDICATED NOT INDICATED  ANA Ser Ql     NEGATIVE   Sed Rate     0 - 22 mm/hr   CRP     <0.60 mg/dL   Vitamin W-09B-12     811211 - 911 pg/mL   RPR     NON REAC   HIV     NONREACTIVE   AntiThromb III Func     75 - 120 %   Protein C Activity     75 - 133 %   Protein C, Total     72 - 160 %   Protein S Activity     69 - 129 %   Protein S Ag, Total     60 - 150 %   Homocysteine     4.0 - 15.4 umol/L    Component     Latest Ref Rng 05/15/2014         4:18 PM  Tube #      1  Color, CSF     COLORLESS PINK (A)  Appearance, CSF     CLEAR HAZY (A)  Supernatant      NOT INDICATED  RBC Count, CSF     0 /cu mm 1225 (H)  WBC, CSF     0 - 5 /cu mm 4  Segmented Neutrophils-CSF     0 - 6 % RARE  Lymphs, CSF     40 - 80 % FEW  Monocyte-Macrophage-Spinal Fluid     15 - 45 % RARE  Other Cells, CSF      TOO FEW TO COUNT, SMEAR AVAILABLE FOR REVIEW  PTT Lupus Anticoagulant     28.0 - 43.0 secs   PTTLA Confirmation     <8.0 secs   PTTLA 4:1 Mix     28.0 - 43.0 secs   DRVVT     <42.9 secs   Drvvt confirmation     <1.15 Ratio    dRVVT Incubated 1:1 Mix     <42.9 secs   Lupus Anticoagulant     NOT DETECTED   Cholesterol     0 - 200 mg/dL   Triglycerides     <914<150 mg/dL   HDL     >78>39 mg/dL   Total CHOL/HDL Ratio        VLDL     0 - 40 mg/dL   LDL (calc)     0 - 99 mg/dL   Specimen Description        Special Requests        Gram Stain        Report Status        Anticardiolipin  IgG     <23 GPL U/mL   Anticardiolipin IgM     <11 MPL U/mL   Anticardiolipin IgA     <22 APL U/mL   Hgb A1c MFr Bld     <5.7 %   Mean Plasma Glucose     <117 mg/dL   Glucose, CSF     43 - 76 mg/dL   Total  Protein, CSF     15 - 45 mg/dL   Crypto Ag     NEGATIVE   Cryptococcal Ag Titer     NOT INDICATED   ANA Ser Ql     NEGATIVE   Sed Rate     0 - 22 mm/hr   CRP     <0.60 mg/dL   Vitamin Z-61     096 - 911 pg/mL   RPR     NON REAC   HIV     NONREACTIVE   AntiThromb III Func     75 - 120 %   Protein C Activity     75 - 133 %   Protein C, Total     72 - 160 %   Protein S Activity     69 - 129 %   Protein S Ag, Total     60 - 150 %   Homocysteine     4.0 - 15.4 umol/L     PHYSICAL EXAM  Mental Status: Alert, oriented, thought content appropriate. Speech fluent without evidence of aphasia. Able to follow 3 step commands without difficulty. Cranial Nerves: II: Pupils equal, round, reactive to light and accommodation. III,IV, VI: ptosis not present, extra-ocular motions intact bilaterally V,VII: smile symmetric, facial light touch sensation normal bilaterally VIII: hearing normal bilaterally IX,X: gag reflex present XI: bilateral shoulder shrug XII: midline tongue extension without atrophy or fasciculations Motor:  Strength 5/5 all extremities. Sensory: Intact to light touch Cerebellar: normal finger-to-nose Gait:  Not tested   ASSESSMENT/PLAN Ms. Angel [name] is a 23 y.o. female with history of migraine headaches, obesity, anemia and family history of coagulopathy,  presenting  with left-sided numbness . She did not receive IV t-PA due to minimal deficits and late presentation.   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   MRI  as above   MRA  as above   Carotid Doppler - Bilateral: intimal wall thickening. Mild soft plaque origin ICA. 1-39% ICA stenosis.       Unable to visualize right vertebral flow.  2D Echo: EF 55-60% no clot, no shunt  MRI with contrast suggest right occipital infarct   TCD bubble study negative for PFO  Ultrasound of b/l lower extremities negative  Cerebral angiogram - pending to rule out vasculitis.  May consider TEE to rule out cardioembolic stroke  CSF clean, not indicative for vasculitis or CNS infection  LDL 72, slightly above the goal <70  HgbA1c 6.2, within the goal  Subcutaneous heparin for VTE prophylaxis Diet regular  Diet NPO time specified Except for: Sips with Meds  with thin liquids  no antithrombotic prior to admission, now on aspirin 325 mg orally every day  Patient counseled to be compliant with her antithrombotic medications  Ongoing aggressive stroke risk factor management  Therapy recommendations:  No follow-up PT recommended.  Disposition:  Pending   Decreased protein S activity  Moderately low protein S activity  Usually responsible exclusive for venous thrombosis, may not able to explain the right PCA stroke  Family history  of clotting disorder.  Consider hematology consult  Hyperlipidemia  Home meds:  Lipitor 10 mg daily resumed in hospital  LDL 72, goal < 70  Lipitor 20 mg daily  Continue statin at discharge  Other Stroke Risk Factors  Obesity, Body mass index is 38.74 kg/(m^2).   Migraines  Birth control pills prior to admission - discussed the risks of BCPs with the patient. She will stop taking.  Family history of coagulopathy - hypercoagulable panel ordered on 05/14/14.  Other Active Problems  Anemia   Elevated  sedimentation rate - 25  Other Pertinent History  Anxiety   Hospital day # 2 Rudean HittJoseph A. Giordano, PA-C Department of Neurology 05/15/2014, 1:29 PM   I, the attending vascular neurologist, have personally obtained a history, examined the patient, evaluated laboratory data, individually viewed imaging studies. Together with the NP/PA, we formulated the assessment and plan of care which reflects our mutual decision.  I have made any additions or clarifications directly to the above note and agree with the findings and plan as currently documented.   Please note: All text in blue color in the note is my addition to the original note.   Marvel PlanJindong Yoali Conry, MD PhD Stroke Neurology 05/15/2014 10:20 PM    To contact Stroke Continuity provider, please refer to [email or web address]. After hours, contact General Neurology

## 2014-05-15 NOTE — Procedures (Signed)
[facility]  [address]  [place]. [place] [id number].  [phone]   TRANSCRANIAL DOPPLER BUBBLE STUDY  Angel ShoreKhamilah Allen  Date of Birth: [birth year] Medical Record Number: [id number] Indications: stroke in young Date of Procedure: 05/15/2014 Clinical History: right PCA stroke Technical Description: Transcranial Doppler Bubble Study was performed at the bedside after taking written informed consent from the patient and explaining risk/benefits. The left middle cerebral artery was insonated using a hand held probe. And IV line had been previously inserted in the right forearm by the RN using aseptic precautions. Agitated saline injection at rest and after valsalva maneuver did not result in few high intensity transient signals (HITS).  Impression: negative Transcranial Doppler Bubble Study indicative of no right to left shunt   Results were explained to the patient. Questions were answered.  Angel PlanJindong Aubry Tucholski, MD PhD Stroke Neurology 05/15/2014 10:22 PM

## 2014-05-15 NOTE — Progress Notes (Signed)
[facility] PROGRESS NOTE  Angel ShoreKhamilah [name] ZOX:[id number] DOB: 1991-05-22 DOA: 05/13/2014 PCP: Altamese CarolinaMARTIN,Angel D, MD  Assessment/Plan: 1. 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 Neurology following 2. Hypercoag panel still in progress, thus far, pt with low Protein S activity of 39 and total ag of 51 3. Carotid dopplers without stenosis 4. 2d echo unremarkable 5. RPR pending, HIV neg, compliments pending, ANA neg 6. CRP 3.6 7. Discussed with Neurology. Plans for IR cath angio, LP, MRI with contrast. Consideration for possible vasulitis 2. Microcytic anemia 1. hgb stable 2. Cont to monitor 3. Headache 1. Resolved 4. DVT prophylaxis 1. Heparin subQ  Code Status: Full Family Communication: Pt in room Disposition Plan: Pending   Consultants:  Neurology  Procedures:    Antibiotics:    HPI/Subjective: See overnight events. Pt with R sided numbness  And complaints of intermittent chest pains.  Objective: Filed Vitals:   05/14/14 2119 05/15/14 0140 05/15/14 0602 05/15/14 0923  BP: 124/65 121/54 104/55 118/61  Pulse: 77 66 74 79  Temp:  97.7 F (36.5 C) 97.8 F (36.6 C) 98.3 F (36.8 C)  TempSrc: Oral Oral Oral Oral  Resp: 20 18 18 20   Height:      Weight:      SpO2: 100% 100% 100% 100%    Intake/Output Summary (Last 24 hours) at 05/15/14 1411 Last data filed at 05/15/14 [id number]  Gross per 24 hour  Intake    840 ml  Output      0 ml  Net    840 ml   Filed Weights   05/13/14 0116  Weight: 122.471 kg (270 lb)    Exam:   General:  Awake, in nad  Cardiovascular: regular, s1, s2  Respiratory: normal resp effort, no wheezing  Abdomen: soft,nondistended  Musculoskeletal: perfused, no clubbing   Data Reviewed: Basic Metabolic Panel:  Recent Labs Lab 05/13/14 0233 05/13/14 0339  NA 138 136*  K 4.3 3.9  CL 108 100  CO2  --  22  GLUCOSE 130* 129*  BUN 14 12  CREATININE 0.60 0.76  CALCIUM  --  9.5   Liver Function Tests:  Recent  Labs Lab 05/13/14 0339  AST 27  ALT 35  ALKPHOS 79  BILITOT <0.2*  PROT 8.2  ALBUMIN 3.2*   No results for input(s): LIPASE, AMYLASE in the last 168 hours. No results for input(s): AMMONIA in the last 168 hours. CBC:  Recent Labs Lab 05/13/14 0220 05/13/14 0233  WBC 8.6  --   NEUTROABS 6.4  --   HGB 9.0* 11.6*  HCT 32.1* 34.0*  MCV 73.3*  --   PLT 435*  --    Cardiac Enzymes:  Recent Labs Lab 05/13/14 1830 05/14/14 0112 05/14/14 2246 05/15/14 0629 05/15/14 1203  TROPONINI <0.30 <0.30 <0.30 <0.30 <0.30   BNP (last 3 results) No results for input(s): PROBNP in the last 8760 hours. CBG: No results for input(s): GLUCAP in the last 168 hours.  No results found for this or any previous visit (from the past 240 hour(s)).   Studies: Ct Head Wo Contrast  05/14/2014   CLINICAL DATA:  New onset right arm and leg tingling and numbness. Patient admitted with left arm and leg paresthesias.  EXAM: CT HEAD WITHOUT CONTRAST  TECHNIQUE: Contiguous axial images were obtained from the base of the skull through the vertex without intravenous contrast.  COMPARISON:  Prior MRI from 05/13/2014.  FINDINGS: Previously identified small ischemic infarcts involving the right PCA territory and splenium  of the corpus callosum are grossly stable, although not well evaluated on this noncontrast CT. No new large vessel territory infarct identified. Gray-white matter differentiation is maintained. No intracranial hemorrhage. No extra-axial fluid collection.  No mass lesion, mass effect, or midline shift. No hydrocephalus. No extra-axial fluid collection.  Scalp soft tissues within normal limits. Calvarium intact. No acute abnormality seen about the orbits.  Paranasal sinuses and mastoid air cells are clear.  IMPRESSION: 1. No new intracranial process identified. 2. Grossly stable known ischemic infarcts involving the right PCA territory and splenium.   Electronically Signed   By: Rise MuBenjamin  McClintock M.D.    On: 05/14/2014 19:20    Scheduled Meds: .  stroke: mapping our early stages of recovery book   Does not apply Once  . aspirin  300 mg Rectal Daily   Or  . aspirin  325 mg Oral Daily  . atorvastatin  20 mg Oral Daily  . heparin  5,000 Units Subcutaneous 3 times per day   Continuous Infusions:   Principal Problem:   Acute ischemic stroke Active Problems:   Migraine   Morbid obesity   Stroke   Anemia   Left arm numbness  Time spent: 30min  Angel [name] K  [facility] Pager [phone]. If 7PM-7AM, please contact night-coverage at [email or web address], password [email or web address] 05/15/2014, 2:11 PM  LOS: 2 days

## 2014-05-15 NOTE — Procedures (Signed)
Procedure Note: Lumbar puncture  Indications: assess for CNS pathology   Operator: Marvel PlanJindong Terryl Molinelli, MD, PhD  Others present: RN  Indications, risks, and benefits explained to patient / surrogate decision maker and informed consent obtained. Time-out was performed, with all individuals present agreeing on the procedure to be performed, the site of procedure, and the patient identity.  Patient positioned, prepped and draped in usual sterile fashion. L3-4 space located using bilateral iliac crests as landmarks. 1% Lidocaine without epinephrine was used to anesthetize the area. An 20G spinal needle was introduced into the sub- arachnoid space. Stylet was removed with appropriate fluid return. Needle removed after adequate fluid collected. Blood loss was minimal. A dry guaze dressing was placed over insertion site. Patient tolerated the procedure well and no complications were observed. Spontaneous movement of bilateral extremities were observed after the procedure.  Opening pressure: not measured  Total fluid removed: 12cc  Color of fluid: clear  Sent for: cell count + diff , gram stain, cultures, glucose, protein, crypto antigen, HSV PCR, VDRL, AFB stain and culture, oligoclonal band, IgG synthesis rate.  Marvel PlanJindong Delores Edelstein, MD PhD Stroke Neurology 05/15/2014 10:24 PM

## 2014-05-15 NOTE — Evaluation (Signed)
Speech Language Pathology Evaluation Patient Details Name: Georgiana ShoreKhamilah Allen MRN: [id number] DOB: 1991/05/08 Today's Date: 05/15/2014 Time: [id number] SLP Time Calculation (min) (ACUTE ONLY): 18 min  Problem List:  Patient Active Problem List   Diagnosis Date Noted  . Stroke 05/13/2014  . Acute ischemic stroke 05/13/2014  . Anemia   . Left arm numbness   . Migraine 03/13/2014  . Morbid obesity 03/13/2014   Past Medical History:  Past Medical History  Diagnosis Date  . Anemia   . Headache   . Obesity   . Anxiety    Past Surgical History:  Past Surgical History  Procedure Laterality Date  . None     HPI:  Patient is a 23 yo married female with acute onset of vision loss and Lt UE loss in strength.  She began having symptoms 2 weks prior and was recently diagnosed with Rt occipital and corpous callosum CVA, with subsequent Lt homonymous hemianopsia.  PMH also includes migraines and obesity.    Assessment / Plan / Recommendation Clinical Impression  Pt's cogntiive-linguistic skills are WFL, and patient and fiancee both report that she is at her baseline level of function. No further SLP f/u recommended at this time.    SLP Assessment  Patient does not need any further Speech Lanaguage Pathology Services    Follow Up Recommendations  None    Frequency and Duration        Pertinent Vitals/Pain Pain Assessment: No/denies pain   SLP Goals  Patient/Family Stated Goal: none stated  SLP Evaluation Prior Functioning  Cognitive/Linguistic Baseline: Within functional limits Type of Home: Apartment Available Help at Discharge: Family;Available 24 hours/day Vocation: Part time employment (works at [employer])   Cognition  Overall Cognitive Status: Within Functional Limits for tasks assessed    Comprehension  Auditory Comprehension Overall Auditory Comprehension: Appears within functional limits for tasks assessed Visual Recognition/Discrimination Discrimination: Within  Function Limits Reading Comprehension Reading Status: Within funtional limits    Expression Expression Primary Mode of Expression: Verbal Verbal Expression Overall Verbal Expression: Appears within functional limits for tasks assessed Written Expression Dominant Hand: Right Written Expression: Not tested   Oral / Motor Oral Motor/Sensory Function Overall Oral Motor/Sensory Function: Appears within functional limits for tasks assessed Motor Speech Overall Motor Speech: Appears within functional limits for tasks assessed   GO      Maxcine HamLaura Paiewonsky, M.A. CCC-SLP [phone]  Maxcine Hamaiewonsky, Naylene Foell 05/15/2014, 12:01 PM

## 2014-05-16 ENCOUNTER — Inpatient Hospital Stay (HOSPITAL_COMMUNITY): Payer: Medicaid Other

## 2014-05-16 ENCOUNTER — Telehealth: Payer: Self-pay | Admitting: Neurology

## 2014-05-16 DIAGNOSIS — E785 Hyperlipidemia, unspecified: Secondary | ICD-10-CM

## 2014-05-16 DIAGNOSIS — D6859 Other primary thrombophilia: Secondary | ICD-10-CM

## 2014-05-16 LAB — BASIC METABOLIC PANEL
ANION GAP: 9 (ref 5–15)
BUN: 9 mg/dL (ref 6–23)
CHLORIDE: 107 meq/L (ref 96–112)
CO2: 24 mmol/L (ref 19–32)
Calcium: 8.9 mg/dL (ref 8.4–10.5)
Creatinine, Ser: 0.86 mg/dL (ref 0.50–1.10)
GFR calc Af Amer: >90 mL/min (ref >90)
GFR calc non Af Amer: >90 mL/min (ref >90)
Glucose, Bld: 102 mg/dL — ABNORMAL HIGH (ref 70–99)
Potassium: 4.3 mmol/L (ref 3.5–5.1)
SODIUM: 140 mmol/L (ref 135–145)

## 2014-05-16 LAB — PROTIME-INR
INR: 1.07 (ref 0.00–1.49)
Prothrombin Time: 14 seconds (ref 11.6–15.2)

## 2014-05-16 LAB — CBC
HEMATOCRIT: 31.1 % — AB (ref 36.0–46.0)
Hemoglobin: 9 g/dL — ABNORMAL LOW (ref 12.0–15.0)
MCH: 21 pg — ABNORMAL LOW (ref 26.0–34.0)
MCHC: 28.9 g/dL — ABNORMAL LOW (ref 30.0–36.0)
MCV: 72.7 fL — AB (ref 78.0–100.0)
Platelets: 409 10*3/uL — ABNORMAL HIGH (ref 150–400)
RBC: 4.28 MIL/uL (ref 3.87–5.11)
RDW: 20.1 % — ABNORMAL HIGH (ref 11.5–15.5)
WBC: 7.7 10*3/uL (ref 4.0–10.5)

## 2014-05-16 LAB — CARDIOLIPIN ANTIBODIES, IGG, IGM, IGA
ANTICARDIOLIPIN IGA: 8 U/mL — AB (ref <22)
Anticardiolipin IgG: 8 GPL U/mL — ABNORMAL LOW (ref <23)
Anticardiolipin IgM: 5 MPL U/mL — ABNORMAL LOW (ref <11)

## 2014-05-16 LAB — COMPLEMENT, TOTAL: Compl, Total (CH50): >60 U/mL — ABNORMAL HIGH (ref 31–60)

## 2014-05-16 LAB — BETA-2-GLYCOPROTEIN I ABS, IGG/M/A
BETA-2-GLYCOPROTEIN I IGA: 11 A Units (ref <20)
Beta-2 Glyco I IgG: 6 G Units (ref <20)
Beta-2-Glycoprotein I IgM: 8 M Units (ref <20)

## 2014-05-16 LAB — C4 COMPLEMENT: COMPLEMENT C4, BODY FLUID: 38 mg/dL (ref 10–40)

## 2014-05-16 LAB — C3 COMPLEMENT: C3 Complement: 259 mg/dL — ABNORMAL HIGH (ref 90–180)

## 2014-05-16 LAB — APTT: aPTT: 29 seconds (ref 24–37)

## 2014-05-16 MED ORDER — IOHEXOL 300 MG/ML  SOLN
150.0000 mL | Freq: Once | INTRAMUSCULAR | Status: AC | PRN
  Administered 2014-05-16: 75 mL via INTRAVENOUS

## 2014-05-16 MED ORDER — SODIUM CHLORIDE 0.9 % IV SOLN
INTRAVENOUS | Status: AC
  Administered 2014-05-16: 12:00:00 via INTRAVENOUS

## 2014-05-16 MED ORDER — HEPARIN SOD (PORK) LOCK FLUSH 100 UNIT/ML IV SOLN
INTRAVENOUS | Status: AC
  Filled 2014-05-16: qty 10

## 2014-05-16 MED ORDER — MIDAZOLAM HCL 2 MG/2ML IJ SOLN
INTRAMUSCULAR | Status: AC | PRN
  Administered 2014-05-16: 1 mg via INTRAVENOUS

## 2014-05-16 MED ORDER — SODIUM CHLORIDE 0.9 % IR SOLN
Status: AC | PRN
  Administered 2014-05-16: 11:00:00

## 2014-05-16 MED ORDER — MIDAZOLAM HCL 2 MG/2ML IJ SOLN
INTRAMUSCULAR | Status: AC
  Filled 2014-05-16: qty 4

## 2014-05-16 MED ORDER — NITROGLYCERIN 1 MG/10 ML FOR IR/CATH LAB
INTRA_ARTERIAL | Status: AC
  Filled 2014-05-16: qty 10

## 2014-05-16 MED ORDER — FENTANYL CITRATE 0.05 MG/ML IJ SOLN
INTRAMUSCULAR | Status: AC | PRN
  Administered 2014-05-16: 25 ug via INTRAVENOUS

## 2014-05-16 MED ORDER — KETOROLAC TROMETHAMINE 15 MG/ML IJ SOLN
15.0000 mg | Freq: Four times a day (QID) | INTRAMUSCULAR | Status: DC | PRN
  Administered 2014-05-16: 15 mg via INTRAVENOUS
  Filled 2014-05-16: qty 1

## 2014-05-16 MED ORDER — LIDOCAINE HCL 1 % IJ SOLN
INTRAMUSCULAR | Status: AC
  Filled 2014-05-16: qty 20

## 2014-05-16 MED ORDER — FENTANYL CITRATE 0.05 MG/ML IJ SOLN
INTRAMUSCULAR | Status: AC
  Filled 2014-05-16: qty 4

## 2014-05-16 NOTE — Progress Notes (Signed)
Pt back in 4N14. Pressure dressing clean, dry, and intact to right groin puncture site. Pt in no acute distress and no complaints of pain at this time. Pt reminded of bedrest and straight leg time for 3 hrs post procedure. Pt understands restrictions. Will continue to monitor.

## 2014-05-16 NOTE — Sedation Documentation (Signed)
Patient denies pain and is resting comfortably.  

## 2014-05-16 NOTE — Progress Notes (Signed)
    [facility] has been requested to perform a transesophageal echocardiogram on 05/17/2014 for CVA.  After careful review of history and examination, the risks and benefits of transesophageal echocardiogram have been explained including risks of esophageal damage, perforation (1:10,000 risk), bleeding, pharyngeal hematoma as well as other potential complications associated with conscious sedation including aspiration, arrhythmia, respiratory failure and death. Alternatives to treatment were discussed, questions were answered. Patient is willing to proceed.   Procedure tentatively scheduled for 4pm tomorrow. Clear liquid diet before 10am. NPO aftr 10 AM.   Azalee CourseMeng, Galen Malkowski, PA-C 05/16/2014 3:20 PM

## 2014-05-16 NOTE — Progress Notes (Signed)
OT Cancellation Note  Patient Details Name: Angel [name] MRN: [id number] DOB: 1990-12-20   Cancelled Treatment:    Reason Eval/Treat Not Completed: Patient at procedure or test/ unavailable (cerebral angio)  Harolyn RutherfordJones, Kattie Santoyo B  Pager: [phone]  05/16/2014, 10:34 AM

## 2014-05-16 NOTE — Procedures (Signed)
S/P 4 vessel cerebral arteriogram. RT CFA approach. Findings.Marland Kitchen. 1.smooth focal  Segmental areas of narrowing of LT PCA p3 branches. Non specific?vasospam  V vasculitis v arterioslerosis.

## 2014-05-16 NOTE — Telephone Encounter (Signed)
Angel [name] with  [facility] is calling because they cannot get in touch with the patient for scheduling. Patients appointment us 05-23-14 at 1:00pm for a bubble study echo. Angel KidneyDebra wanted us to try to reach the patient and they will mail appointment time to patient.Thank you.

## 2014-05-16 NOTE — Progress Notes (Signed)
[facility] PROGRESS NOTE  Angel [name] WUJ:[id number] DOB: 1990-09-25 DOA: 05/13/2014 PCP: Altamese CarolinaMARTIN,[name], MD  Assessment/Plan: 1. 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 Neurology following 2. Hypercoag panel still in progress, thus far, pt with low Protein S activity of 39 and total ag of 51 3. Carotid dopplers without stenosis 4. 2d echo unremarkable 5. RPR pending, HIV neg, compliments pending, ANA neg 6. CRP 3.6 7. IR cath angio focal occlusion without obvious signs of vasculitis per Neurology 8. LP was done by Neurology 9. MRI with contrast demonstrating gyriform enhancement in the R medial  10. Neurology recommended TEE to be done on 12/23, possible d/c home if neg study after TEE 2. Microcytic anemia 1. hgb stable 2. Cont to monitor 3. Headache 1. Resolved 4. DVT prophylaxis 1. Heparin subQ  Code Status: Full Family Communication: Pt in room Disposition Plan: Possible d/c home 12/23   Consultants:  Neurology  Procedures:    Antibiotics:    HPI/Subjective: No complaints. No acute events noted overnight  Objective: Filed Vitals:   05/16/14 1100 05/16/14 1105 05/16/14 1110 05/16/14 1127  BP: 134/75 140/77 142/77 136/84  Pulse: 80 77 75 73  Temp:      TempSrc:      Resp: 19 18 18 15   Height:      Weight:      SpO2: 100% 100% 100% 100%    Intake/Output Summary (Last 24 hours) at 05/16/14 1320 Last data filed at 05/15/14 1833  Gross per 24 hour  Intake    220 ml  Output      0 ml  Net    220 ml   Filed Weights   05/13/14 0116  Weight: 122.471 kg (270 lb)    Exam:   General:  Awake, in nad  Cardiovascular: regular, s1, s2  Respiratory: normal resp effort, no wheezing  Abdomen: soft,nondistended  Musculoskeletal: perfused, no clubbing   Data Reviewed: Basic Metabolic Panel:  Recent Labs Lab 05/13/14 0233 05/13/14 0339 05/16/14 0604  NA 138 136* 140  K 4.3 3.9 4.3  CL 108 100 107  CO2  --  22 24  GLUCOSE 130*  129* 102*  BUN 14 12 9   CREATININE 0.60 0.76 0.86  CALCIUM  --  9.5 8.9   Liver Function Tests:  Recent Labs Lab 05/13/14 0339  AST 27  ALT 35  ALKPHOS 79  BILITOT <0.2*  PROT 8.2  ALBUMIN 3.2*   No results for input(s): LIPASE, AMYLASE in the last 168 hours. No results for input(s): AMMONIA in the last 168 hours. CBC:  Recent Labs Lab 05/13/14 0220 05/13/14 0233 05/16/14 0604  WBC 8.6  --  7.7  NEUTROABS 6.4  --   --   HGB 9.0* 11.6* 9.0*  HCT 32.1* 34.0* 31.1*  MCV 73.3*  --  72.7*  PLT 435*  --  409*   Cardiac Enzymes:  Recent Labs Lab 05/13/14 1830 05/14/14 0112 05/14/14 2246 05/15/14 0629 05/15/14 1203  TROPONINI <0.30 <0.30 <0.30 <0.30 <0.30   BNP (last 3 results) No results for input(s): PROBNP in the last 8760 hours. CBG: No results for input(s): GLUCAP in the last 168 hours.  Recent Results (from the past 240 hour(s))  CSF culture     Status: None (Preliminary result)   Collection Time: 05/15/14  4:04 PM  Result Value Ref Range Status   Specimen Description CSF  Final   Special Requests NONE  Final   Gram Stain   Final  WBC PRESENT, PREDOMINANTLY MONONUCLEAR NO ORGANISMS SEEN CYTOSPIN SLIDE Performed at [facility] Performed at [facility]    Culture PENDING  Incomplete   Report Status PENDING  Incomplete  Gram stain     Status: None   Collection Time: 05/15/14  4:04 PM  Result Value Ref Range Status   Specimen Description CSF  Final   Special Requests NONE  Final   Gram Stain   Final    CYTOSPIN PREP WBC PRESENT, PREDOMINANTLY MONONUCLEAR NO ORGANISMS SEEN    Report Status 05/15/2014 FINAL  Final  Fungus Culture with Smear     Status: None (Preliminary result)   Collection Time: 05/15/14  4:04 PM  Result Value Ref Range Status   Specimen Description CSF  Final   Special Requests NONE  Final   Fungal Smear   Final    NO YEAST OR FUNGAL ELEMENTS SEEN Performed at [facility]    Culture   Final     CULTURE IN PROGRESS FOR FOUR WEEKS Performed at [facility]    Report Status PENDING  Incomplete     Studies: Ct Head Wo Contrast  05/14/2014   CLINICAL DATA:  New onset right arm and leg tingling and numbness. Patient admitted with left arm and leg paresthesias.  EXAM: CT HEAD WITHOUT CONTRAST  TECHNIQUE: Contiguous axial images were obtained from the base of the skull through the vertex without intravenous contrast.  COMPARISON:  Prior MRI from 05/13/2014.  FINDINGS: Previously identified small ischemic infarcts involving the right PCA territory and splenium of the corpus callosum are grossly stable, although not well evaluated on this noncontrast CT. No new large vessel territory infarct identified. Gray-white matter differentiation is maintained. No intracranial hemorrhage. No extra-axial fluid collection.  No mass lesion, mass effect, or midline shift. No hydrocephalus. No extra-axial fluid collection.  Scalp soft tissues within normal limits. Calvarium intact. No acute abnormality seen about the orbits.  Paranasal sinuses and mastoid air cells are clear.  IMPRESSION: 1. No new intracranial process identified. 2. Grossly stable known ischemic infarcts involving the right PCA territory and splenium.   Electronically Signed   By: Rise Mu M.D.   On: 05/14/2014 19:20   Mr Laqueta Jean Contrast  05/15/2014   CLINICAL DATA:  Recent stroke. Progressive numbness left face arm and leg last night. Mild headache.  EXAM: MRI HEAD WITH CONTRAST  TECHNIQUE: Multiplanar, multiecho pulse sequences of the brain and surrounding structures were obtained with intravenous contrast.  COMPARISON:  MRI 05/13/2014 and 05/10/2014.  CONTRAST:  20mL MULTIHANCE GADOBENATE DIMEGLUMINE 529 MG/ML IV SOLN  FINDINGS: Postcontrast imaging was performed in all 3 planes. Unenhanced T2 and diffusion-weighted imaging were not repeated however were positive in the right occipital lobe and right splenium of the corpus  callosum on both of the recent MRI scans. In addition, there was occlusion of the right P3 segment.  Postcontrast imaging reveals gyriform enhancement in the right occipital cortex medially. This is more extensive than that seen on the diffusion-weighted imaging and is most consistent with subacute infarction. The abnormality in the splenium on diffusion on prior studies does not enhance. The enhancement pattern is most consistent with subacute infarct and not neoplasm.  No other areas of abnormal enhancement are identified. Ventricle size normal.  IMPRESSION: Gyriform enhancement in the right medial occipital lobe most consistent with subacute infarct. This area is larger than that seen on diffusion-weighted imaging recently.   Electronically Signed   By: Leonette Most  Chestine Sporelark M.D.   On: 05/15/2014 14:11    Scheduled Meds: .  stroke: mapping our early stages of recovery book   Does not apply Once  . aspirin  300 mg Rectal Daily   Or  . aspirin  325 mg Oral Daily  . atorvastatin  20 mg Oral Daily  . fentaNYL      . heparin  5,000 Units Subcutaneous 3 times per day  . lidocaine      . midazolam      . nitroGLYCERIN       Continuous Infusions: . sodium chloride 75 mL/hr at 05/16/14 1208    Principal Problem:   Acute ischemic stroke Active Problems:   Migraine   Morbid obesity   Stroke   Anemia   Left arm numbness   Hyperlipidemia  Time spent: 30min  Kron Everton K  [facility] Pager [phone]. If 7PM-7AM, please contact night-coverage at [email or web address], password [email or web address] 05/16/2014, 1:20 PM  LOS: 3 days

## 2014-05-16 NOTE — Progress Notes (Signed)
STROKE TEAM PROGRESS NOTE   HISTORY Angel [name] is an 23 y.o. female with a past medical history significant for episodic migraine, obesity, and anemia, [facility] for further evaluation of new onset left-sided numbness and abnormal MRI. She indicated that she saw her neurologist couple of weeks ago due to worsening migraines and brain MRI was requested was completed 2 days ago and she was called last and informed that the MRI was positive for stroke, and asked to come to the ED if new symptoms developed. Patient indicated that last night she had acute onset of numbness involving left face-arm-leg and she presented to [facility]ED. Complains of a mild HA but denies vertigo, double vision, difficulty swallowing, imbalance, focal weakness, slurred speech, language difficulty, or bladder impairment. York SpanielSaid that couple weeks ago she had a transient episode of painless visual loss left eye and still having blurred vision.   Date last known well: 05/12/14 Time last known well: uncertain tPA Given: no, out of the window  She reports that there is a family history of coagulopathy with multiple members of her family on anticoagulation including her mother.  SUBJECTIVE (INTERVAL HISTORY) Family members present. The patient feels she is improving but is not 100% yet.   No issues overnight as per nursing staff.    OBJECTIVE Temp:  [97.7 F (36.5 C)-98.3 F (36.8 C)] 97.9 F (36.6 C) (12/22 1804) Pulse Rate:  [73-98] 86 (12/22 1804) Cardiac Rhythm:  [-] Normal sinus rhythm (12/22 2000) Resp:  [14-21] 18 (12/22 1804) BP: (104-142)/(53-84) 104/53 mmHg (12/22 1804) SpO2:  [98 %-100 %] 100 % (12/22 1804)  No results for input(s): GLUCAP in the last 168 hours.  Recent Labs Lab 05/13/14 0233 05/13/14 0339 05/16/14 0604  NA 138 136* 140  K 4.3 3.9 4.3  CL 108 100 107  CO2  --  22 24  GLUCOSE 130* 129* 102*  BUN 14 12 9   CREATININE 0.60 0.76  0.86  CALCIUM  --  9.5 8.9    Recent Labs Lab 05/13/14 0339  AST 27  ALT 35  ALKPHOS 79  BILITOT <0.2*  PROT 8.2  ALBUMIN 3.2*    Recent Labs Lab 05/13/14 0220 05/13/14 0233 05/16/14 0604  WBC 8.6  --  7.7  NEUTROABS 6.4  --   --   HGB 9.0* 11.6* 9.0*  HCT 32.1* 34.0* 31.1*  MCV 73.3*  --  72.7*  PLT 435*  --  409*    Recent Labs Lab 05/13/14 1830 05/14/14 0112 05/14/14 2246 05/15/14 0629 05/15/14 1203  TROPONINI <0.30 <0.30 <0.30 <0.30 <0.30    Recent Labs  05/16/14 0604  LABPROT 14.0  INR 1.07   No results for input(s): COLORURINE, LABSPEC, PHURINE, GLUCOSEU, HGBUR, BILIRUBINUR, KETONESUR, PROTEINUR, UROBILINOGEN, NITRITE, LEUKOCYTESUR in the last 72 hours.  Invalid input(s): APPERANCEUR     Component Value Date/Time   CHOL 142 05/13/2014 0820   TRIG 77 05/13/2014 0820   HDL 55 05/13/2014 0820   CHOLHDL 2.6 05/13/2014 0820   VLDL 15 05/13/2014 0820   LDLCALC 72 05/13/2014 0820   Lab Results  Component Value Date   HGBA1C 6.2* 05/13/2014      Component Value Date/Time   LABOPIA NONE DETECTED 05/13/2014 0453   COCAINSCRNUR NONE DETECTED 05/13/2014 0453   LABBENZ NONE DETECTED 05/13/2014 0453   AMPHETMU NONE DETECTED 05/13/2014 0453   THCU NONE DETECTED 05/13/2014 0453   LABBARB NONE DETECTED 05/13/2014 0453    No results  for input(s): ETH in the last 168 hours.  I have personally reviewed the radiological images below and agree with the radiology interpretations.  Ct Head Wo Contrast 05/13/2014    No acute intracranial process. Recently described small infarcts not visualized on this noncontrast head CT.     MRI brain 12/16 Right occipital lobe sulcal T2FLAIR hyperintensity without associated hemorrhage on SWI views. 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 Elsewhere there are a few scattered punctate subcortical T2 hyperintensities in the centrum semiovale. Main considerations  would include a variety cerebral vasoconstriction syndromes, some of which may be reversible and others which may be progressive/permanent. These may include vasculitidies (primary CNS angitis, giant cell arteritis, granulomatous disease, other systemic rheumatologic diseases), hypercoagulable states (autoimmune or paraneoplastic), infectious processes (meningo-encephalitis), subarachnoid hemorrhage, as well a group of conditions associated with so called reversible cerebral vasoconstriction syndrome (RCVS).   MRI / MRA Brain Wo Contrast 05/13/14 1. Stable abnormal signal in the right occipital lobe and at the right splenium of the corpus callosum since 05/10/2014. Some of the abnormal signal seems confined to the right occipital subarachnoid space as before. 2. However, there is distal right PCA occlusion (right P3 segments).  3. 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  4. Nonetheless, CSF analysis still may be valuable to confirm No abnormal subarachnoid process.  5. No other MRI or intracranial MRA abnormality identified.     MRI with contrast 05/15/14 Gyriform enhancement in the right medial occipital lobe most consistent with subacute infarct. This area is larger than that seen on diffusion-weighted imaging recently.  CUS - Bilateral: intimal wall thickening. Mild soft plaque origin ICA. 1-39% ICA stenosis. Unable to visualize right vertebral flow. Left vertebral flow is antegrade. ICA/CCA ratio: R-0.89 L-0.63.  TCD bubble study - negative  LE venous doppler - negative  Component     Latest Ref Rng 05/13/2014 05/14/2014           Tube #         Color, CSF     COLORLESS    Appearance, CSF     CLEAR    Supernatant         RBC Count, CSF     0 /cu mm    WBC, CSF     0 - 5 /cu mm    Segmented Neutrophils-CSF     0 - 6 %    Lymphs, CSF     40 - 80 %    Monocyte-Macrophage-Spinal Fluid     15 - 45 %    Other Cells, CSF         PTT Lupus  Anticoagulant     28.0 - 43.0 secs  36.6  PTTLA Confirmation     <8.0 secs  NOT APPL  PTTLA 4:1 Mix     28.0 - 43.0 secs  NOT APPL  DRVVT     <42.9 secs  42.2  Drvvt confirmation     <1.15 Ratio  NOT APPL  dRVVT Incubated 1:1 Mix     <42.9 secs  NOT APPL  Lupus Anticoagulant     NOT DETECTED  NOT DETECTED  Cholesterol     0 - 200 mg/dL 629142   Triglycerides     <150 mg/dL 77   HDL     >52>39 mg/dL 55   Total CHOL/HDL Ratio      2.6   VLDL  0 - 40 mg/dL 15   LDL (calc)     0 - 99 mg/dL 72   Specimen Description         Special Requests         Gram Stain         Report Status         Anticardiolipin IgG     <23 GPL U/mL  8 (L)  Anticardiolipin IgM     <11 MPL U/mL  5 (L)  Anticardiolipin IgA     <22 APL U/mL  8 (L)  Hgb A1c MFr Bld     <5.7 % 6.2 (H)   Mean Plasma Glucose     <117 mg/dL 161 (H)   Glucose, CSF     43 - 76 mg/dL    Total  Protein, CSF     15 - 45 mg/dL    Crypto Ag     NEGATIVE    Cryptococcal Ag Titer     NOT INDICATED    ANA Ser Ql     NEGATIVE NEGATIVE   Sed Rate     0 - 22 mm/hr 25 (H)   CRP     <0.60 mg/dL 3.6 (H)   Vitamin W-96     211 - 911 pg/mL  249  RPR     NON REAC  NON REAC  HIV     NONREACTIVE  NONREACTIVE  AntiThromb III Func     75 - 120 %  76  Protein C Activity     75 - 133 %  88  Protein C, Total     72 - 160 %  62 (L)  Protein S Activity     69 - 129 %  39 (L)  Protein S Ag, Total     60 - 150 %  51 (L)  Homocysteine     4.0 - 15.4 umol/L  7.5   Component     Latest Ref Rng 05/15/2014         4:04 PM  Tube #      4  Color, CSF     COLORLESS COLORLESS  Appearance, CSF     CLEAR CLEAR  Supernatant      NOT INDICATED  RBC Count, CSF     0 /cu mm 29 (H)  WBC, CSF     0 - 5 /cu mm 2  Segmented Neutrophils-CSF     0 - 6 % RARE  Lymphs, CSF     40 - 80 % FEW  Monocyte-Macrophage-Spinal Fluid     15 - 45 % RARE  Other Cells, CSF      TOO FEW TO COUNT, SMEAR AVAILABLE FOR REVIEW  PTT Lupus  Anticoagulant     28.0 - 43.0 secs   PTTLA Confirmation     <8.0 secs   PTTLA 4:1 Mix     28.0 - 43.0 secs   DRVVT     <42.9 secs   Drvvt confirmation     <1.15 Ratio   dRVVT Incubated 1:1 Mix     <42.9 secs   Lupus Anticoagulant     NOT DETECTED   Cholesterol     0 - 200 mg/dL   Triglycerides     <045 mg/dL   HDL     >40 mg/dL   Total CHOL/HDL Ratio        VLDL     0 - 40 mg/dL   LDL (calc)  0 - 99 mg/dL   Specimen Description      CSF  Special Requests      NONE  Gram Stain      CYTOSPIN PREP . . .  Report Status      05/15/2014 FINAL  Anticardiolipin IgG     <23 GPL U/mL   Anticardiolipin IgM     <11 MPL U/mL   Anticardiolipin IgA     <22 APL U/mL   Hgb A1c MFr Bld     <5.7 %   Mean Plasma Glucose     <117 mg/dL   Glucose, CSF     43 - 76 mg/dL 68  Total  Protein, CSF     15 - 45 mg/dL 15  Crypto Ag     NEGATIVE NEGATIVE  Cryptococcal Ag Titer     NOT INDICATED NOT INDICATED  ANA Ser Ql     NEGATIVE   Sed Rate     0 - 22 mm/hr   CRP     <0.60 mg/dL   Vitamin W-09     811 - 911 pg/mL   RPR     NON REAC   HIV     NONREACTIVE   AntiThromb III Func     75 - 120 %   Protein C Activity     75 - 133 %   Protein C, Total     72 - 160 %   Protein S Activity     69 - 129 %   Protein S Ag, Total     60 - 150 %   Homocysteine     4.0 - 15.4 umol/L    Component     Latest Ref Rng 05/15/2014         4:18 PM  Tube #      1  Color, CSF     COLORLESS PINK (A)  Appearance, CSF     CLEAR HAZY (A)  Supernatant      NOT INDICATED  RBC Count, CSF     0 /cu mm 1225 (H)  WBC, CSF     0 - 5 /cu mm 4  Segmented Neutrophils-CSF     0 - 6 % RARE  Lymphs, CSF     40 - 80 % FEW  Monocyte-Macrophage-Spinal Fluid     15 - 45 % RARE  Other Cells, CSF      TOO FEW TO COUNT, SMEAR AVAILABLE FOR REVIEW  PTT Lupus Anticoagulant     28.0 - 43.0 secs   PTTLA Confirmation     <8.0 secs   PTTLA 4:1 Mix     28.0 - 43.0 secs   DRVVT     <42.9  secs   Drvvt confirmation     <1.15 Ratio   dRVVT Incubated 1:1 Mix     <42.9 secs   Lupus Anticoagulant     NOT DETECTED   Cholesterol     0 - 200 mg/dL   Triglycerides     <914 mg/dL   HDL     >78 mg/dL   Total CHOL/HDL Ratio        VLDL     0 - 40 mg/dL   LDL (calc)     0 - 99 mg/dL   Specimen Description        Special Requests        Gram Stain        Report Status  Anticardiolipin IgG     <23 GPL U/mL   Anticardiolipin IgM     <11 MPL U/mL   Anticardiolipin IgA     <22 APL U/mL   Hgb A1c MFr Bld     <5.7 %   Mean Plasma Glucose     <117 mg/dL   Glucose, CSF     43 - 76 mg/dL   Total  Protein, CSF     15 - 45 mg/dL   Crypto Ag     NEGATIVE   Cryptococcal Ag Titer     NOT INDICATED   ANA Ser Ql     NEGATIVE   Sed Rate     0 - 22 mm/hr   CRP     <0.60 mg/dL   Vitamin N-82     956 - 911 pg/mL   RPR     NON REAC   HIV     NONREACTIVE   AntiThromb III Func     75 - 120 %   Protein C Activity     75 - 133 %   Protein C, Total     72 - 160 %   Protein S Activity     69 - 129 %   Protein S Ag, Total     60 - 150 %   Homocysteine     4.0 - 15.4 umol/L     PHYSICAL EXAM  Mental Status: Alert, oriented, thought content appropriate. Speech fluent without evidence of aphasia. Able to follow 3 step commands without difficulty. Cranial Nerves: II: Pupils equal, round, reactive to light and accommodation. III,IV, VI: ptosis not present, extra-ocular motions intact bilaterally V,VII: smile symmetric, facial light touch sensation normal bilaterally VIII: hearing normal bilaterally IX,X: gag reflex present XI: bilateral shoulder shrug XII: midline tongue extension without atrophy or fasciculations Motor:  Strength 5/5 all extremities. Sensory: Intact to light touch Cerebellar: normal finger-to-nose Gait:  Not tested   ASSESSMENT/PLAN Ms. Angel [name] is a 23 y.o. female with history of migraine headaches, obesity, anemia and  family history of coagulopathy,  presenting with left-sided numbness . She did not receive IV t-PA due to minimal deficits and late presentation.   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   MRI  as above   MRA  as above   Carotid Doppler - Bilateral: intimal wall thickening. Mild soft plaque origin ICA. 1-39% ICA stenosis. Unable to visualize right vertebral flow.  2D Echo: EF 55-60% no clot, no shunt  MRI with contrast suggest right occipital infarct   TCD bubble study negative for PFO  Ultrasound of b/l lower extremities negative  Cerebral angiogram - right P3 occlusoin, no sign of vasculitis.  Will do TEE to rule out cardioembolic stroke  CSF clean, not indicative for vasculitis or CNS infection  LDL 72, slightly above the goal <70  HgbA1c 6.2, within the goal  Subcutaneous heparin for VTE prophylaxis Diet regular Diet NPO time specified Except for: Sips with Meds  Diet clear liquid  with thin liquids  no antithrombotic prior to admission, now on aspirin 325 mg orally every day  Patient counseled to be compliant with her antithrombotic medications  Ongoing aggressive stroke risk factor management  Therapy recommendations:  No follow-up PT recommended.  Disposition:  home   Decreased protein S activity  Moderately low protein S activity  Usually responsible exclusive for venous thrombosis, may not able to explain the right PCA stroke  Family history  of clotting disorder.  Consider hematology consult  Hyperlipidemia  Home meds:  Lipitor 10 mg daily resumed in hospital  LDL 72, goal < 70  Lipitor 20 mg daily  Continue statin at discharge  Other Stroke Risk Factors  Obesity, Body mass index is 38.74 kg/(m^2).   Migraines  Birth control pills prior to admission - discussed the risks of BCPs with the patient. She will stop taking.  Family history of coagulopathy - hypercoagulable panel ordered on  05/14/14.  Other Active Problems  Anemia   Elevated sedimentation rate - 25  Other Pertinent History  Anxiety   Hospital day # 3  Marvel Plan, MD PhD Stroke Neurology 05/16/2014 9:21 PM    To contact Stroke Continuity provider, please refer to [email or web address]. After hours, contact General Neurology

## 2014-05-16 NOTE — Telephone Encounter (Signed)
Pt has been admitted to hospital and I have done TCD bubble study, MRI with contrast, MRA of the head, as well as lumbar puncture and hypercoagulable work up. And we are also doing cerebral angio today and I am thinking TEE for her too. So could you please cancel all her outpt testing and imaging studies. Thank you.  Angel PlanJindong Daesean Lazarz, MD PhD Stroke Neurology 05/16/2014 10:08 AM

## 2014-05-17 ENCOUNTER — Other Ambulatory Visit: Payer: Self-pay | Admitting: Neurology

## 2014-05-17 ENCOUNTER — Encounter (HOSPITAL_COMMUNITY)
Admission: EM | Disposition: A | Discharge status: Home / Self Care | Payer: Self-pay | Source: Home / Self Care | Attending: Internal Medicine

## 2014-05-17 ENCOUNTER — Encounter (HOSPITAL_COMMUNITY): Payer: Self-pay | Admitting: *Deleted

## 2014-05-17 DIAGNOSIS — G459 Transient cerebral ischemic attack, unspecified: Secondary | ICD-10-CM

## 2014-05-17 DIAGNOSIS — I639 Cerebral infarction, unspecified: Secondary | ICD-10-CM

## 2014-05-17 HISTORY — PX: TEE WITHOUT CARDIOVERSION: SHX5443

## 2014-05-17 LAB — HERPES SIMPLEX VIRUS(HSV) DNA BY PCR
HSV 1 DNA: NOT DETECTED
HSV 2 DNA: NOT DETECTED

## 2014-05-17 SURGERY — ECHOCARDIOGRAM, TRANSESOPHAGEAL
Anesthesia: Moderate Sedation

## 2014-05-17 MED ORDER — MIDAZOLAM HCL 10 MG/2ML IJ SOLN
INTRAMUSCULAR | Status: DC | PRN
Start: 2014-05-17 — End: 2014-05-17
  Administered 2014-05-17 (×2): 2 mg via INTRAVENOUS
  Administered 2014-05-17: 1 mg via INTRAVENOUS

## 2014-05-17 MED ORDER — DIPHENHYDRAMINE HCL 50 MG/ML IJ SOLN
INTRAMUSCULAR | Status: AC
  Filled 2014-05-17: qty 1

## 2014-05-17 MED ORDER — BUTAMBEN-TETRACAINE-BENZOCAINE 2-2-14 % EX AERO
INHALATION_SPRAY | CUTANEOUS | Status: DC | PRN
  Administered 2014-05-17: 2 via TOPICAL

## 2014-05-17 MED ORDER — ASPIRIN EC 325 MG PO TBEC: 325.0000 mg | DELAYED_RELEASE_TABLET | Freq: Every day | ORAL | Status: DC

## 2014-05-17 MED ORDER — FENTANYL CITRATE 0.05 MG/ML IJ SOLN
INTRAMUSCULAR | Status: DC | PRN
  Administered 2014-05-17: 50 ug via INTRAVENOUS
  Administered 2014-05-17 (×2): 25 ug via INTRAVENOUS

## 2014-05-17 MED ORDER — SODIUM CHLORIDE 0.9 % IV SOLN
INTRAVENOUS | Status: DC
  Administered 2014-05-17: 500 mL via INTRAVENOUS

## 2014-05-17 MED ORDER — ATORVASTATIN CALCIUM 20 MG PO TABS: 20.0000 mg | ORAL_TABLET | Freq: Every day | ORAL | Status: DC

## 2014-05-17 MED ORDER — MIDAZOLAM HCL 5 MG/ML IJ SOLN
INTRAMUSCULAR | Status: AC
  Filled 2014-05-17: qty 2

## 2014-05-17 MED ORDER — DIPHENHYDRAMINE HCL 50 MG/ML IJ SOLN
INTRAMUSCULAR | Status: DC | PRN
  Administered 2014-05-17 (×2): 25 mg via INTRAVENOUS

## 2014-05-17 MED ORDER — FENTANYL CITRATE 0.05 MG/ML IJ SOLN
INTRAMUSCULAR | Status: AC
  Filled 2014-05-17: qty 2

## 2014-05-17 NOTE — Progress Notes (Signed)
Pt now NPO for procedure this afternoon. Pt aware and compliant. Will continue to monitor.

## 2014-05-17 NOTE — Progress Notes (Signed)
STROKE TEAM PROGRESS NOTE   HISTORY Angel [name] is an 23 y.o. female with a past medical history significant for episodic migraine, obesity, and anemia, [facility] for further evaluation of new onset left-sided numbness and abnormal MRI. She indicated that she saw her neurologist couple of weeks ago due to worsening migraines and brain MRI was requested was completed 2 days ago and she was called last and informed that the MRI was positive for stroke, and asked to come to the ED if new symptoms developed. Patient indicated that last night she had acute onset of numbness involving left face-arm-leg and she presented to [facility]. Complains of a mild HA but denies vertigo, double vision, difficulty swallowing, imbalance, focal weakness, slurred speech, language difficulty, or bladder impairment. York SpanielSaid that couple weeks ago she had a transient episode of painless visual loss left eye and still having blurred vision.   Date last known well: 05/12/14 Time last known well: uncertain tPA Given: no, out of the window  She reports that there is a family history of coagulopathy with multiple members of her family on anticoagulation including her mother.  SUBJECTIVE (INTERVAL HISTORY) Angel RainwaterFiancee present. Pt has no complains. TCD bubble negative for PFO. No DVT in LEs. Pt will see hematology as outpt. Cerebral angio no vasculitis but right P3 occlusion. Will do TEE today.    OBJECTIVE Temp:  [97.6 F (36.4 C)-98.6 F (37 C)] 97.6 F (36.4 C) (12/23 1501) Pulse Rate:  [70-112] 70 (12/23 1501) Cardiac Rhythm:  [-] Sinus tachycardia (12/23 0830) Resp:  [11-20] 20 (12/23 1501) BP: (104-150)/(40-100) 129/64 mmHg (12/23 1501) SpO2:  [98 %-100 %] 100 % (12/23 1501)  No results for input(s): GLUCAP in the last 168 hours.  Recent Labs Lab 05/13/14 0233 05/13/14 0339 05/16/14 0604  NA 138 136* 140  K 4.3 3.9 4.3  CL 108 100 107  CO2  --  22 24   GLUCOSE 130* 129* 102*  BUN 14 12 9   CREATININE 0.60 0.76 0.86  CALCIUM  --  9.5 8.9    Recent Labs Lab 05/13/14 0339  AST 27  ALT 35  ALKPHOS 79  BILITOT <0.2*  PROT 8.2  ALBUMIN 3.2*    Recent Labs Lab 05/13/14 0220 05/13/14 0233 05/16/14 0604  WBC 8.6  --  7.7  NEUTROABS 6.4  --   --   HGB 9.0* 11.6* 9.0*  HCT 32.1* 34.0* 31.1*  MCV 73.3*  --  72.7*  PLT 435*  --  409*    Recent Labs Lab 05/13/14 1830 05/14/14 0112 05/14/14 2246 05/15/14 0629 05/15/14 1203  TROPONINI <0.30 <0.30 <0.30 <0.30 <0.30    Recent Labs  05/16/14 0604  LABPROT 14.0  INR 1.07   No results for input(s): COLORURINE, LABSPEC, PHURINE, GLUCOSEU, HGBUR, BILIRUBINUR, KETONESUR, PROTEINUR, UROBILINOGEN, NITRITE, LEUKOCYTESUR in the last 72 hours.  Invalid input(s): APPERANCEUR     Component Value Date/Time   CHOL 142 05/13/2014 0820   TRIG 77 05/13/2014 0820   HDL 55 05/13/2014 0820   CHOLHDL 2.6 05/13/2014 0820   VLDL 15 05/13/2014 0820   LDLCALC 72 05/13/2014 0820   Lab Results  Component Value Date   HGBA1C 6.2* 05/13/2014      Component Value Date/Time   LABOPIA NONE DETECTED 05/13/2014 0453   COCAINSCRNUR NONE DETECTED 05/13/2014 0453   LABBENZ NONE DETECTED 05/13/2014 0453   AMPHETMU NONE DETECTED 05/13/2014 0453   THCU NONE DETECTED 05/13/2014 0453   LABBARB  NONE DETECTED 05/13/2014 0453    No results for input(s): ETH in the last 168 hours.  I have personally reviewed the radiological images below and agree with the radiology interpretations.  Ct Head Wo Contrast 05/13/2014    No acute intracranial process. Recently described small infarcts not visualized on this noncontrast head CT.     MRI brain 12/16 Right occipital lobe sulcal T2FLAIR hyperintensity without associated hemorrhage on SWI views. 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 Elsewhere there are a few scattered punctate subcortical T2  hyperintensities in the centrum semiovale. Main considerations would include a variety cerebral vasoconstriction syndromes, some of which may be reversible and others which may be progressive/permanent. These may include vasculitidies (primary CNS angitis, giant cell arteritis, granulomatous disease, other systemic rheumatologic diseases), hypercoagulable states (autoimmune or paraneoplastic), infectious processes (meningo-encephalitis), subarachnoid hemorrhage, as well a group of conditions associated with so called reversible cerebral vasoconstriction syndrome (RCVS).   MRI / MRA Brain Wo Contrast 05/13/14 1. Stable abnormal signal in the right occipital lobe and at the right splenium of the corpus callosum since 05/10/2014. Some of the abnormal signal seems confined to the right occipital subarachnoid space as before. 2. However, there is distal right PCA occlusion (right P3 segments).  3. 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  4. Nonetheless, CSF analysis still may be valuable to confirm No abnormal subarachnoid process.  5. No other MRI or intracranial MRA abnormality identified.     MRI with contrast 05/15/14 Gyriform enhancement in the right medial occipital lobe most consistent with subacute infarct. This area is larger than that seen on diffusion-weighted imaging recently.  CUS - Bilateral: intimal wall thickening. Mild soft plaque origin ICA. 1-39% ICA stenosis. Unable to visualize right vertebral flow. Left vertebral flow is antegrade. ICA/CCA ratio: R-0.89 L-0.63.  TCD bubble study - negative  LE venous doppler - negative  TEE - Normal TEE  Cerebral angio - 1.smooth focal Segmental areas of narrowing of LT PCA p3 (I think it should be right PCA P3) branches. Non specific?vasospam V vasculitis v arterioslerosis.  Component     Latest Ref Rng 05/13/2014 05/14/2014           Tube #         Color, CSF     COLORLESS    Appearance, CSF      CLEAR    Supernatant         RBC Count, CSF     0 /cu mm    WBC, CSF     0 - 5 /cu mm    Segmented Neutrophils-CSF     0 - 6 %    Lymphs, CSF     40 - 80 %    Monocyte-Macrophage-Spinal Fluid     15 - 45 %    Other Cells, CSF         PTT Lupus Anticoagulant     28.0 - 43.0 secs  36.6  PTTLA Confirmation     <8.0 secs  NOT APPL  PTTLA 4:1 Mix     28.0 - 43.0 secs  NOT APPL  DRVVT     <42.9 secs  42.2  Drvvt confirmation     <1.15 Ratio  NOT APPL  dRVVT Incubated 1:1 Mix     <42.9 secs  NOT APPL  Lupus Anticoagulant     NOT DETECTED  NOT DETECTED  Cholesterol  0 - 200 mg/dL 960   Triglycerides     <150 mg/dL 77   HDL     >45 mg/dL 55   Total CHOL/HDL Ratio      2.6   VLDL     0 - 40 mg/dL 15   LDL (calc)     0 - 99 mg/dL 72   Specimen Description         Special Requests         Gram Stain         Report Status         Anticardiolipin IgG     <23 GPL U/mL  8 (L)  Anticardiolipin IgM     <11 MPL U/mL  5 (L)  Anticardiolipin IgA     <22 APL U/mL  8 (L)  Hgb A1c MFr Bld     <5.7 % 6.2 (H)   Mean Plasma Glucose     <117 mg/dL 409 (H)   Glucose, CSF     43 - 76 mg/dL    Total  Protein, CSF     15 - 45 mg/dL    Crypto Ag     NEGATIVE    Cryptococcal Ag Titer     NOT INDICATED    ANA Ser Ql     NEGATIVE NEGATIVE   Sed Rate     0 - 22 mm/hr 25 (H)   CRP     <0.60 mg/dL 3.6 (H)   Vitamin W-11     211 - 911 pg/mL  249  RPR     NON REAC  NON REAC  HIV     NONREACTIVE  NONREACTIVE  AntiThromb III Func     75 - 120 %  76  Protein C Activity     75 - 133 %  88  Protein C, Total     72 - 160 %  62 (L)  Protein S Activity     69 - 129 %  39 (L)  Protein S Ag, Total     60 - 150 %  51 (L)  Homocysteine     4.0 - 15.4 umol/L  7.5   Component     Latest Ref Rng 05/15/2014         4:04 PM  Tube #      4  Color, CSF     COLORLESS COLORLESS  Appearance, CSF     CLEAR CLEAR  Supernatant      NOT INDICATED  RBC Count, CSF     0 /cu  mm 29 (H)  WBC, CSF     0 - 5 /cu mm 2  Segmented Neutrophils-CSF     0 - 6 % RARE  Lymphs, CSF     40 - 80 % FEW  Monocyte-Macrophage-Spinal Fluid     15 - 45 % RARE  Other Cells, CSF      TOO FEW TO COUNT, SMEAR AVAILABLE FOR REVIEW  PTT Lupus Anticoagulant     28.0 - 43.0 secs   PTTLA Confirmation     <8.0 secs   PTTLA 4:1 Mix     28.0 - 43.0 secs   DRVVT     <42.9 secs   Drvvt confirmation     <1.15 Ratio   dRVVT Incubated 1:1 Mix     <42.9 secs   Lupus Anticoagulant     NOT DETECTED   Cholesterol     0 - 200 mg/dL   Triglycerides     <  150 mg/dL   HDL     >60 mg/dL   Total CHOL/HDL Ratio        VLDL     0 - 40 mg/dL   LDL (calc)     0 - 99 mg/dL   Specimen Description      CSF  Special Requests      NONE  Gram Stain      CYTOSPIN PREP . . .  Report Status      05/15/2014 FINAL  Anticardiolipin IgG     <23 GPL U/mL   Anticardiolipin IgM     <11 MPL U/mL   Anticardiolipin IgA     <22 APL U/mL   Hgb A1c MFr Bld     <5.7 %   Mean Plasma Glucose     <117 mg/dL   Glucose, CSF     43 - 76 mg/dL 68  Total  Protein, CSF     15 - 45 mg/dL 15  Crypto Ag     NEGATIVE NEGATIVE  Cryptococcal Ag Titer     NOT INDICATED NOT INDICATED  ANA Ser Ql     NEGATIVE   Sed Rate     0 - 22 mm/hr   CRP     <0.60 mg/dL   Vitamin A-54     098 - 911 pg/mL   RPR     NON REAC   HIV     NONREACTIVE   AntiThromb III Func     75 - 120 %   Protein C Activity     75 - 133 %   Protein C, Total     72 - 160 %   Protein S Activity     69 - 129 %   Protein S Ag, Total     60 - 150 %   Homocysteine     4.0 - 15.4 umol/L    Component     Latest Ref Rng 05/15/2014         4:18 PM  Tube #      1  Color, CSF     COLORLESS PINK (A)  Appearance, CSF     CLEAR HAZY (A)  Supernatant      NOT INDICATED  RBC Count, CSF     0 /cu mm 1225 (H)  WBC, CSF     0 - 5 /cu mm 4  Segmented Neutrophils-CSF     0 - 6 % RARE  Lymphs, CSF     40 - 80 % FEW   Monocyte-Macrophage-Spinal Fluid     15 - 45 % RARE  Other Cells, CSF      TOO FEW TO COUNT, SMEAR AVAILABLE FOR REVIEW  PTT Lupus Anticoagulant     28.0 - 43.0 secs   PTTLA Confirmation     <8.0 secs   PTTLA 4:1 Mix     28.0 - 43.0 secs   DRVVT     <42.9 secs   Drvvt confirmation     <1.15 Ratio   dRVVT Incubated 1:1 Mix     <42.9 secs   Lupus Anticoagulant     NOT DETECTED   Cholesterol     0 - 200 mg/dL   Triglycerides     <119 mg/dL   HDL     >14 mg/dL   Total CHOL/HDL Ratio        VLDL     0 - 40 mg/dL   LDL (calc)     0 -  99 mg/dL   Specimen Description        Special Requests        Gram Stain        Report Status        Anticardiolipin IgG     <23 GPL U/mL   Anticardiolipin IgM     <11 MPL U/mL   Anticardiolipin IgA     <22 APL U/mL   Hgb A1c MFr Bld     <5.7 %   Mean Plasma Glucose     <117 mg/dL   Glucose, CSF     43 - 76 mg/dL   Total  Protein, CSF     15 - 45 mg/dL   Crypto Ag     NEGATIVE   Cryptococcal Ag Titer     NOT INDICATED   ANA Ser Ql     NEGATIVE   Sed Rate     0 - 22 mm/hr   CRP     <0.60 mg/dL   Vitamin Z-61     096 - 911 pg/mL   RPR     NON REAC   HIV     NONREACTIVE   AntiThromb III Func     75 - 120 %   Protein C Activity     75 - 133 %   Protein C, Total     72 - 160 %   Protein S Activity     69 - 129 %   Protein S Ag, Total     60 - 150 %   Homocysteine     4.0 - 15.4 umol/L     PHYSICAL EXAM  Mental Status: Alert, oriented, thought content appropriate. Speech fluent without evidence of aphasia. Able to follow 3 step commands without difficulty. Cranial Nerves: II: Pupils equal, round, reactive to light and accommodation. III,IV, VI: ptosis not present, extra-ocular motions intact bilaterally V,VII: smile symmetric, facial light touch sensation normal bilaterally VIII: hearing normal bilaterally IX,X: gag reflex present XI: bilateral shoulder shrug XII: midline tongue extension without  atrophy or fasciculations Motor:  Strength 5/5 all extremities. Sensory: Intact to light touch Cerebellar: normal finger-to-nose Gait:  Not tested   ASSESSMENT/PLAN Ms. Angel [name] is a 23 y.o. female with history of migraine headaches, obesity, anemia and family history of coagulopathy,  presenting with left-sided numbness . She did not receive IV t-PA due to minimal deficits and late presentation.   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   MRI  as above   MRA  as above   Carotid Doppler - Bilateral: intimal wall thickening. Mild soft plaque origin ICA. 1-39% ICA stenosis. Unable to visualize right vertebral flow.  2D Echo: EF 55-60% no clot, no shunt  MRI with contrast suggest right occipital infarct   TCD bubble study negative for PFO  Ultrasound of b/l lower extremities negative  Cerebral angiogram - right P3 occlusoin, no sign of vasculitis.  TEE was normal today  Check alpha-galactocidase to rule out Fabry's disease.  CSF clean, not indicative for vasculitis or CNS infection  LDL 72, slightly above the goal <70  HgbA1c 6.2, within the goal  Subcutaneous heparin for VTE prophylaxis Diet - low sodium heart healthy  Diet regular  with thin liquids  no antithrombotic prior to admission, now on aspirin 325 mg orally every day  Patient counseled to be compliant with her antithrombotic medications  Ongoing aggressive stroke risk factor management  Therapy recommendations:  No  follow-up PT recommended.  Disposition:  home   Decreased protein S activity  Moderately low protein S activity  Usually responsible exclusive for venous thrombosis, may not able to explain the right PCA stroke  Family history of clotting disorder.  Consider hematology consult - pt will be seen as outpt.  Hyperlipidemia  Home meds:  Lipitor 10 mg daily resumed in hospital  LDL 72, goal < 70  Lipitor 20 mg  daily  Continue statin at discharge  Other Stroke Risk Factors  Obesity, Body mass index is 38.74 kg/(m^2).   Migraines  Birth control pills prior to admission - discussed the risks of BCPs with the patient. She will stop taking.  Family history of coagulopathy - hypercoagulable panel done.  Other Active Problems  Anemia   Elevated sedimentation rate - 25  Other Pertinent History  Anxiety   Hospital day # 4  Marvel PlanJindong Byrd Rushlow, MD PhD Stroke Neurology 05/17/2014 5:20 PM    To contact Stroke Continuity provider, please refer to [email or web address]. After hours, contact General Neurology

## 2014-05-17 NOTE — Progress Notes (Signed)
UR COMPLETED  

## 2014-05-17 NOTE — CV Procedure (Signed)
TEE:  Indication TIA  5 mg versed 50 mg Benedryl 50 ug Fentanyl  Normal LV EF 65% Normal valves Normal aorta No LAA thrombus NO PFO negative buble study No effusion Normal atria Normal RV No SOE  Normal TEE

## 2014-05-17 NOTE — Progress Notes (Signed)
  Echocardiogram Echocardiogram Transesophageal has been performed.  Angel [name], Angel [name] 05/17/2014, 2:29 PM

## 2014-05-17 NOTE — Discharge Instructions (Signed)
Ischemic Stroke °A stroke (cerebrovascular accident) is the sudden death of brain tissue. It is a medical emergency. A stroke can cause permanent loss of brain function. This can cause problems with different parts of your body. A transient ischemic attack (TIA) is different because it does not cause permanent damage. A TIA is a short-lived problem of poor blood flow affecting a part of the brain. A TIA is also a serious problem because having a TIA greatly increases the chances of having a stroke. When symptoms first develop, you cannot know if the problem might be a stroke or a TIA. °CAUSES  °A stroke is caused by a decrease of oxygen supply to an area of your brain. It is usually the result of a small blood clot or collection of cholesterol or fat (plaque) that blocks blood flow in the brain. A stroke can also be caused by blocked or damaged carotid arteries.  °RISK FACTORS °· High blood pressure (hypertension). °· High cholesterol. °· Diabetes mellitus. °· Heart disease. °· The buildup of plaque in the blood vessels (peripheral artery disease or atherosclerosis). °· The buildup of plaque in the blood vessels providing blood and oxygen to the brain (carotid artery stenosis). °· An abnormal heart rhythm (atrial fibrillation). °· Obesity. °· Smoking. °· Taking oral contraceptives (especially in combination with smoking). °· Physical inactivity. °· A diet high in fats, salt (sodium), and calories. °· Alcohol use. °· Use of illegal drugs (especially cocaine and methamphetamine). °· Being African American. °· Being over the age of 55. °· Family history of stroke. °· Previous history of blood clots, stroke, TIA, or heart attack. °· Sickle cell disease. °SYMPTOMS  °These symptoms usually develop suddenly, or may be newly present upon awakening from sleep: °· Sudden weakness or numbness of the face, arm, or leg, especially on one side of the body. °· Sudden trouble walking or difficulty moving arms or legs. °· Sudden  confusion. °· Sudden personality changes. °· Trouble speaking (aphasia) or understanding. °· Difficulty swallowing. °· Sudden trouble seeing in one or both eyes. °· Double vision. °· Dizziness. °· Loss of balance or coordination. °· Sudden severe headache with no known cause. °· Trouble reading or writing. °DIAGNOSIS  °Your health care provider can often determine the presence or absence of a stroke based on your symptoms, history, and physical exam. Computed tomography (CT) of the brain is usually performed to confirm the stroke, determine causes, and determine stroke severity. Other tests may be done to find the cause of the stroke. These tests may include: °· Electrocardiography. °· Continuous heart monitoring. °· Echocardiography. °· Carotid ultrasonography. °· Magnetic resonance imaging (MRI). °· A scan of the brain circulation. °· Blood tests. °PREVENTION  °The risk of a stroke can be decreased by appropriately treating high blood pressure, high cholesterol, diabetes, heart disease, and obesity and by quitting smoking, limiting alcohol, and staying physically active. °TREATMENT  °Time is of the essence. It is important to seek treatment at the first sign of these symptoms because you may receive a medicine to dissolve the clot (thrombolytic) that cannot be given if too much time has passed since your symptoms began. Even if you do not know when your symptoms began, get treatment as soon as possible as there are other treatment options available including oxygen, intravenous (IV) fluids, and medicines to thin the blood (anticoagulants). Treatment of stroke depends on the duration, severity, and cause of your symptoms. Medicines and dietary changes may be used to address diabetes, high blood   pressure, and other risk factors. Physical, speech, and occupational therapists will assess you and work with you to improve any functions impaired by the stroke. Measures will be taken to prevent short-term and long-term  complications, including infection from breathing foreign material into the lungs (aspiration pneumonia), blood clots in the legs, bedsores, and falls. Rarely, surgery may be needed to remove large blood clots or to open up blocked arteries. °HOME CARE INSTRUCTIONS  °· Take medicines only as directed by your health care provider. Follow the directions carefully. Medicines may be used to control risk factors for a stroke. Be sure you understand all your medicine instructions. °· You may be told to take a medicine to thin the blood, such as aspirin or the anticoagulant warfarin. Warfarin needs to be taken exactly as instructed. °¨ Too much and too little warfarin are both dangerous. Too much warfarin increases the risk of bleeding. Too little warfarin continues to allow the risk for blood clots. While taking warfarin, you will need to have regular blood tests to measure your blood clotting time. These blood tests usually include both the PT and INR tests. The PT and INR results allow your health care provider to adjust your dose of warfarin. The dose can change for many reasons. It is critically important that you take warfarin exactly as prescribed, and that you have your PT and INR levels drawn exactly as directed. °¨ Many foods, especially foods high in vitamin K, can interfere with warfarin and affect the PT and INR results. Foods high in vitamin K include spinach, kale, broccoli, cabbage, collard and turnip greens, brussels sprouts, peas, cauliflower, seaweed, and parsley, as well as beef and pork liver, green tea, and soybean oil. You should eat a consistent amount of foods high in vitamin K. Avoid major changes in your diet, or notify your health care provider before changing your diet. Arrange a visit with a dietitian to answer your questions. °¨ Many medicines can interfere with warfarin and affect the PT and INR results. You must tell your health care provider about any and all medicines you take. This  includes all vitamins and supplements. Be especially cautious with aspirin and anti-inflammatory medicines. Do not take or discontinue any prescribed or over-the-counter medicine except on the advice of your health care provider or pharmacist. °¨ Warfarin can have side effects, such as excessive bruising or bleeding. You will need to hold pressure over cuts for longer than usual. Your health care provider or pharmacist will discuss other potential side effects. °¨ Avoid sports or activities that may cause injury or bleeding. °¨ Be mindful when shaving, flossing your teeth, or handling sharp objects. °¨ Alcohol can change the body's ability to handle warfarin. It is best to avoid alcoholic drinks or consume only very small amounts while taking warfarin. Notify your health care provider if you change your alcohol intake. °¨ Notify your dentist or other health care providers before procedures. °· If swallow studies have determined that your swallowing reflex is present, you should eat healthy foods. Including 5 or more servings of fruits and vegetables a day may reduce the risk of stroke. Foods may need to be a certain consistency (soft or pureed), or small bites may need to be taken in order to avoid aspirating or choking. Certain dietary changes may be advised to address high blood pressure, high cholesterol, diabetes, or obesity. °¨ Food choices that are low in sodium, saturated fat, trans fat, and cholesterol are recommended to manage high blood pressure. °¨   Food choies that are high in fiber, and low in saturated fat, trans fat, and cholesterol may control cholesterol levels. °¨ Controlling carbohydrates and sugar intake is recommended to manage diabetes. °¨ Reducing calorie intake and making food choices that are low in sodium, saturated fat, trans fat, and cholesterol are recommended to manage obesity. °· Maintain a healthy weight. °· Stay physically active. It is recommended that you get at least 30 minutes of  activity on all or most days. °· Do not use any tobacco products including cigarettes, chewing tobacco, or electronic cigarettes. °· Limit alcohol use even if you are not taking warfarin. Moderate alcohol use is considered to be: °¨ No more than 2 drinks each day for men. °¨ No more than 1 drink each day for nonpregnant women. °· Home safety. A safe home environment is important to reduce the risk of falls. Your health care provider may arrange for specialists to evaluate your home. Having grab bars in the bedroom and bathroom is often important. Your health care provider may arrange for equipment to be used at home, such as raised toilets and a seat for the shower. °· Physical, occupational, and speech therapy. Ongoing therapy may be needed to maximize your recovery after a stroke. If you have been advised to use a walker or a cane, use it at all times. Be sure to keep your therapy appointments. °· Follow all instructions for follow-up with your health care provider. This is very important. This includes any referrals, physical therapy, rehabilitation, and lab tests. Proper follow-up can prevent another stroke from occurring. °SEEK MEDICAL CARE IF: °· You have personality changes. °· You have difficulty swallowing. °· You are seeing double. °· You have dizziness. °· You have a fever. °· You have skin breakdown. °SEEK IMMEDIATE MEDICAL CARE IF:  °Any of these symptoms may represent a serious problem that is an emergency. Do not wait to see if the symptoms will go away. Get medical help right away. Call your local emergency services (911 in [place] Do not drive yourself to the hospital. °· You have sudden weakness or numbness of the face, arm, or leg, especially on one side of the body. °· You have sudden trouble walking or difficulty moving arms or legs. °· You have sudden confusion. °· You have trouble speaking (aphasia) or understanding. °· You have sudden trouble seeing in one or both eyes. °· You have a loss of  balance or coordination. °· You have a sudden, severe headache with no known cause. °· You have new chest pain or an irregular heartbeat. °· You have a partial or total loss of consciousness. °Document Released: 05/12/2005 Document Revised: 09/26/2013 Document Reviewed: 12/21/2011 °[employer]® Patient Information ©2015 [employer]. This information is not intended to replace advice given to you by your health care provider. Make sure you discuss any questions you have with your health care provider. ° °

## 2014-05-17 NOTE — Interval H&P Note (Signed)
History and Physical Interval Note:  05/17/2014 1:54 PM  Angel [name]  has presented today for surgery, with the diagnosis of STROKE  The various methods of treatment have been discussed with the patient and family. After consideration of risks, benefits and other options for treatment, the patient has consented to  Procedure(s): TRANSESOPHAGEAL ECHOCARDIOGRAM (TEE) (N/A) as a surgical intervention .  The patient's history has been reviewed, patient examined, no change in status, stable for surgery.  I have reviewed the patient's chart and labs.  Questions were answered to the patient's satisfaction.     Charlton HawsPeter Toma Arts

## 2014-05-17 NOTE — Progress Notes (Signed)
OT Discharge Note  Patient Details Name: Angel [name] MRN: [id number] DOB: [birth year]   Cancelled Treatment:    Reason Eval/Treat Not Completed: OT screened, no needs identified, will sign off. OT met with patient and with pending eye appointment. Pt noted to have L eye only peripheral vision loss ~30 decrease in superior/ inferior quadrants. Pt with normal range in R eye. Pt is young and already compensating for vision deficits. OT to sign off acutely with no needs.  Peri Maris  Pager: [phone]  05/17/2014, 10:21 AM

## 2014-05-17 NOTE — Discharge Summary (Signed)
[facility]  Physician Discharge Summary   Patient ID: Angel [name] MRN: [id number] DOB/AGE: Jan 10, 1991 23 y.o.  Admit date: 05/13/2014 Discharge date: 05/17/2014  PCP: Altamese [place], MD  DISCHARGE DIAGNOSES:  Principal Problem:   Acute ischemic stroke Active Problems:   Migraine   Morbid obesity   Stroke   Anemia   Left arm numbness   Hyperlipidemia   Protein S deficiency   RECOMMENDATIONS FOR OUTPATIENT FOLLOW UP: 1. Patient to follow-up with hematology for low protein C and protein S levels. Await callback from Dr. Patsy Lager office.  DISCHARGE CONDITION: fair  Diet recommendation: Heart healthy diet  Filed Weights   05/13/14 0116  Weight: 122.471 kg (270 lb)    INITIAL HISTORY: This is a 23 year old African-American female presented with left-sided numbness. She also had some visual impairment, which has resolved. She was found to have an acute stroke. She was admitted for further evaluation.  Consultations:  Neurology  Procedures: Carotid Doppler Bilateral: intimal wall thickening. Mild soft plaque origin ICA. 1-39% ICA stenosis. Unable to visualize right vertebral flow. Left vertebral flow is antegrade. ICA/CCA ratio: R-0.89 L-0.63.  2-D echocardiogram. Study Conclusions - Left ventricle: The cavity size was normal. Systolic function wasnormal. The estimated ejection fraction was in the range of 55%to 60%. Wall motion was normal; there were no regional wallmotion abnormalities. The tissue Doppler parameters were normal.Left ventricular diastolic function parameters were normal. - Aortic valve: There was trivial regurgitation. - Aortic root: The aortic root was normal in size. - Mitral valve: There was mild regurgitation. - Left atrium: The atrium was normal in size. - Right ventricle: Systolic function was normal. - Right atrium: The atrium was normal in size. - Atrial septum: No defect or patent foramen ovale was identified. -  Tricuspid valve: There was mild regurgitation. - Pulmonic valve: There was no regurgitation. - Pulmonary arteries: Systolic pressure was within the normal range. - Inferior vena cava: The vessel was normal in size. - Pericardium, extracardiac: There was no pericardial effusion. Impressions: - Normal biventricular size and function. Mildly thickened mitralvalve with mild mitral and mild and trivial tricuspidregurgitation. Oherwise normal study.  Lower extremity venous Doppler Summary: - No evidence of deep vein thrombosis involving the visualizedveins of the right lower extremity and left lower extremity. - No evidence of Baker&'s cyst on the right or left.  Lower extremity arterial Dopplers Summary: ABIs are within normal limits bilaterally.  TEE "Normal LV EF 65% Normal valves Normal aorta No LAA thrombus NO PFO negative buble study No effusion Normal atria Normal RV No SOE"  Cerebral angiogram IMPRESSION: Angiographically non visualization of the right posterior cerebral artery at the P2 P3 region consistent with angiographic occlusion. Tapered smooth narrowing of the distal P2 branches of the left posterior cerebral artery, and also of the left ophthalmic artery. These findings are nonspecific. They may represent vasospasm versus vasculitis versus less likely arteriosclerosis given the patient age. Clinical correlation is suggested.  HOSPITAL COURSE:   R occipital lobe and corpus callosum CVA Patient was seen by neurology. She underwent stroke workup. Please see all the reports above. Hypercoagulable panel was checked. She was found to have low protein s and protein C. This will require further evaluation. Most likely she will require repeat testing in a few weeks. I discussed this with Dr. Arbutus Ped. He recommended that the patient see Dr. Cyndie Chime. Have placed calls with voicemails to his office with the internal medicine residents clinic. Have told the patient that if she  doesn't hear  from the office she should call them. And if there is no resolution to the matter she should call her PCP for further advice. RPR nonreactive, HIV neg, ANA neg. CRP 3.6. She also underwent cerebral angiograms as discussed above. Neurology did not feel that this suggested vasculitis. LP was done by Neurology. Cultures were negative. She underwent TEE today. She will see neurology as an outpatient. She'll be discharged on aspirin. Due to family history of blood clots and her presentation she's been asked to discontinue her birth control medications.  Microcytic anemia Hgb stable  Patient is stable for discharge.  PERTINENT LABS:  The results of significant diagnostics from this hospitalization (including imaging, microbiology, ancillary and laboratory) are listed below for reference.    Microbiology: Recent Results (from the past 240 hour(s))  CSF culture     Status: None (Preliminary result)   Collection Time: 05/15/14  4:04 PM  Result Value Ref Range Status   Specimen Description CSF  Final   Special Requests NONE  Final   Gram Stain   Final    WBC PRESENT, PREDOMINANTLY MONONUCLEAR NO ORGANISMS SEEN CYTOSPIN SLIDE [facility] at [facility]    Culture   Final    NO GROWTH 1 DAY Performed at [facility]    Report Status PENDING  Incomplete  Gram stain     Status: None   Collection Time: 05/15/14  4:04 PM  Result Value Ref Range Status   Specimen Description CSF  Final   Special Requests NONE  Final   Gram Stain   Final    CYTOSPIN PREP WBC PRESENT, PREDOMINANTLY MONONUCLEAR NO ORGANISMS SEEN    Report Status 05/15/2014 FINAL  Final  AFB culture with smear     Status: None (Preliminary result)   Collection Time: 05/15/14  4:04 PM  Result Value Ref Range Status   Specimen Description CSF  Final   Special Requests NONE  Final   Acid Fast Smear NOT DONE Performed at [facility]   Final   Culture   Final     CULTURE WILL BE EXAMINED FOR 6 WEEKS BEFORE ISSUING A FINAL REPORT Performed at [facility]    Report Status PENDING  Incomplete  Fungus Culture with Smear     Status: None (Preliminary result)   Collection Time: 05/15/14  4:04 PM  Result Value Ref Range Status   Specimen Description CSF  Final   Special Requests NONE  Final   Fungal Smear   Final    NO YEAST OR FUNGAL ELEMENTS SEEN Performed at [facility]    Culture   Final    CULTURE IN PROGRESS FOR FOUR WEEKS Performed at [facility]    Report Status PENDING  Incomplete     Labs:  Component  Latest Ref Rng 05/13/2014 05/14/2014  PTT Lupus Anticoagulant  28.0 - 43.0 secs  36.6  PTTLA Confirmation  <8.0 secs  NOT APPL  PTTLA 4:1 Mix  28.0 - 43.0 secs  NOT APPL  DRVVT  <42.9 secs  42.2  Drvvt confirmation  <1.15 Ratio  NOT APPL  dRVVT Incubated 1:1 Mix  <42.9 secs  NOT APPL  Lupus Anticoagulant  NOT DETECTED  NOT DETECTED  Cholesterol  0 - 200 mg/dL 409142   Triglycerides  <811<150 mg/dL 77   HDL  >91>39 mg/dL 55   Total CHOL/HDL Ratio   2.6   VLDL  0 - 40 mg/dL 15  LDL (calc)  0 - 99 mg/dL 72   Specimen Description      Special Requests      Gram Stain      Report Status      Anticardiolipin IgG  <23 GPL U/mL  8 (L)  Anticardiolipin IgM  <11 MPL U/mL  5 (L)  Anticardiolipin IgA  <22 APL U/mL  8 (L)  Hgb A1c MFr Bld  <5.7 % 6.2 (H)   Mean Plasma Glucose  <117 mg/dL 161 (H)   Glucose, CSF  43 - 76 mg/dL    Total Protein, CSF  15 - 45 mg/dL    Crypto Ag  NEGATIVE    Cryptococcal Ag Titer  NOT INDICATED    ANA Ser Ql  NEGATIVE NEGATIVE   Sed Rate  0 - 22 mm/hr 25 (H)   CRP  <0.60 mg/dL 3.6 (H)   Vitamin W-96  211 - 911 pg/mL  249  RPR  NON REAC  NON REAC  HIV   NONREACTIVE  NONREACTIVE  AntiThromb III Func  75 - 120 %  76  Protein C Activity  75 - 133 %  88  Protein C, Total  72 - 160 %  62 (L)  Protein S Activity  69 - 129 %  39 (L)  Protein S Ag, Total  60 - 150 %  51 (L)  Homocysteine  4.0 - 15.4 umol/L  7.5      Basic Metabolic Panel:  Recent Labs Lab 05/13/14 0233 05/13/14 0339 05/16/14 0604  NA 138 136* 140  K 4.3 3.9 4.3  CL 108 100 107  CO2  --  22 24  GLUCOSE 130* 129* 102*  BUN 14 12 9   CREATININE 0.60 0.76 0.86  CALCIUM  --  9.5 8.9   Liver Function Tests:  Recent Labs Lab 05/13/14 0339  AST 27  ALT 35  ALKPHOS 79  BILITOT <0.2*  PROT 8.2  ALBUMIN 3.2*   CBC:  Recent Labs Lab 05/13/14 0220 05/13/14 0233 05/16/14 0604  WBC 8.6  --  7.7  NEUTROABS 6.4  --   --   HGB 9.0* 11.6* 9.0*  HCT 32.1* 34.0* 31.1*  MCV 73.3*  --  72.7*  PLT 435*  --  409*   Cardiac Enzymes:  Recent Labs Lab 05/13/14 1830 05/14/14 0112 05/14/14 2246 05/15/14 0629 05/15/14 1203  TROPONINI <0.30 <0.30 <0.30 <0.30 <0.30    IMAGING STUDIES Ct Head Wo Contrast  05/14/2014   CLINICAL DATA:  New onset right arm and leg tingling and numbness. Patient admitted with left arm and leg paresthesias.  EXAM: CT HEAD WITHOUT CONTRAST  TECHNIQUE: Contiguous axial images were obtained from the base of the skull through the vertex without intravenous contrast.  COMPARISON:  Prior MRI from 05/13/2014.  FINDINGS: Previously identified small ischemic infarcts involving the right PCA territory and splenium of the corpus callosum are grossly stable, although not well evaluated on this noncontrast CT. No new large vessel territory infarct identified. Gray-white matter differentiation is maintained. No intracranial hemorrhage. No extra-axial fluid collection.  No mass lesion, mass effect, or midline shift. No hydrocephalus. No extra-axial fluid collection.  Scalp soft tissues within normal limits.  Calvarium intact. No acute abnormality seen about the orbits.  Paranasal sinuses and mastoid air cells are clear.  IMPRESSION: 1. No new intracranial process identified. 2. Grossly stable known ischemic infarcts involving the right PCA territory and splenium.   Electronically Signed   By: Janell Quiet.D.  On: 05/14/2014 19:20   Ct Head Wo Contrast  05/13/2014   CLINICAL DATA:  New onset left-sided numbness.  A initial encounter.  EXAM: CT HEAD WITHOUT CONTRAST  TECHNIQUE: Contiguous axial images were obtained from the base of the skull through the vertex without intravenous contrast.  COMPARISON:  Prior MRI from 05/10/2014.  FINDINGS: There is no acute intracranial hemorrhage or infarct. No mass lesion or midline shift. Gray-white matter differentiation is well maintained. Ventricles are normal in size without evidence of hydrocephalus. CSF containing spaces are within normal limits. No extra-axial fluid collection.  The calvarium is intact.  Orbital soft tissues are within normal limits.  The paranasal sinuses and mastoid air cells are well pneumatized and free of fluid.  Scalp soft tissues are unremarkable.  IMPRESSION: No acute intracranial process. Recently described small infarcts not visualized on this noncontrast head CT.   Electronically Signed   By: Rise Mu M.D.   On: 05/13/2014 03:58   Mr Brain Wo Contrast  05/13/2014   ADDENDUM REPORT: 05/13/2014 13:28  ADDENDUM: Study discussed by telephone with Dr. Loretha Brasil (covering for Dr. Lorretta Harp ) on 05/13/2014 at 1313 hrs.   Electronically Signed   By: Augusto Gamble M.D.   On: 05/13/2014 13:28   05/13/2014   CLINICAL DATA:  23 year old female with progressive chronic headaches. Abnormal brain MRI on 05/10/2014. Initial encounter.  The patient advises transient left side vision loss 2 weeks ago which improved spontaneously.  EXAM: MRI HEAD WITHOUT CONTRAST  MRA HEAD WITHOUT CONTRAST  TECHNIQUE: Multiplanar, multiecho pulse sequences  of the brain and surrounding structures were obtained without intravenous contrast. Angiographic images of the head were obtained using MRA technique without contrast.  COMPARISON:  Brain MRI 05/10/2014.  Head CT 0316 hr today.  FINDINGS: MRI HEAD FINDINGS  Small foci of restricted diffusion re - identified in the posterior hemisphere, including at the right occipital pole and also in the right splenium of the corpus callosum. In this region there is abnormal increased FLAIR and trace diffusion signal in the sulci of the left occipital lobe (series 7, image 14). No associated abnormal subarachnoid signal on other sequences. These findings have not significantly changed since 3 days ago.  No other restricted diffusion identified. Subarachnoid signal elsewhere is within normal limits. Wallace Cullens and white matter signal elsewhere is stable and normal for age. No areas of abnormal susceptibility identified in the brain. No cortical encephalomalacia.  Major intracranial vascular flow voids are stable and within normal limits. No midline shift, mass effect, evidence of mass lesion, or ventriculomegaly. Cervicomedullary junction and pituitary are within normal limits. Negative visualized cervical spine.  Nonspecific decreased T1 bone marrow signal, specially in the visible cervical spine. Visualized scalp soft tissues are within normal limits. Visible internal auditory structures appear normal. Visualized paranasal sinuses and mastoids are clear. Visualized orbit soft tissues are within normal limits.  MRA HEAD FINDINGS  Antegrade flow in the posterior circulation with codominant distal vertebral arteries. Normal right PICA and dominant appearing left AICA. Normal vertebrobasilar junction. Normal basilar artery. Normal AICA and SCA origins. Fetal type left PCA origin. Normal right PCA origin. Right posterior communicating artery is present.  Both proximal PCA branches are within normal limits, but there is abrupt occlusion of  the distal right PCA just at or beyond the P3 segment, it near where the superior an inferior PCA division branches arise. Distal left PCA branches are normal.  Antegrade flow in both ICA siphons. Negative distal cervical ICAs. No  siphon stenosis. Ophthalmic and posterior communicating artery origins are normal. Normal carotid termini. Normal MCA and ACA origins. Anterior communicating artery and visualized bilateral ACA branches are within normal limits. Visualized left MCA branches are within normal limits. Visualized right MCA branches are within normal limits. Distal posterior right MCA flow may be mildly hyperdynamic, an compensation for the right PCA findings.  IMPRESSION: 1. Stable abnormal signal in the right occipital lobe and at the right splenium of the corpus callosum since 05/10/2014. Some of the abnormal signal seems confined to the right occipital subarachnoid space as before. 2. However, there is distal right PCA occlusion (right P3 segments). 3. 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 4. Nonetheless, CSF analysis still may be valuable to confirm No abnormal subarachnoid process. 5. No other MRI or intracranial MRA abnormality identified.  Electronically Signed: By: Augusto GambleLee  Hall M.D. On: 05/13/2014 12:29   Mr Brain Wo Contrast  05/11/2014   [facility]  NEUROIMAGING REPORT   STUDY DATE: 05/10/14 PATIENT NAME: Angel [name] DOB: 1991-03-13 MRN: [id number]  ORDERING CLINICIAN: Naomie DeanAntonia Ahern, MD  CLINICAL HISTORY: 23 year old female with right sided headaches and  blurred vision. History of migraine headaches.  EXAM: MRI brain (without)  TECHNIQUE: MRI of the brain without contrast was obtained utilizing 5 mm  axial slices with T1, T2, T2 flair, SWI and diffusion weighted views.  T1  sagittal and T2 coronal views were obtained. CONTRAST: no IMAGING SITE: [facility] [address] (1.5 Tesla MRI)    FINDINGS:  There are several foci of  DWI hyperintensity in the right splenium of  corpus callosum (series 3 image 71) and right occipital lobe (series 3  image 66) which are also hypointense on ADC maps. There is abnormal DWI  hyperintense signal over the cortical surface of the posterior and mesial  right occipital lobe, some of which is also hypointense on ADC maps. There  is associated sulcal T2FLAIR hyperintense signal in the right occipital  lobe, without evidence of subarachnoid hemorrhage on SWI or T1 views.  Elsewhere there are a few scattered punctate subcortical T2  hyperintensities in the centrum semiovale.   The remaining cortical sulci, fissures and cisterns are normal in size and  appearance. Lateral, third and fourth ventricle are normal in size and  appearance. No extra-axial fluid collections are seen. No evidence of mass  effect or midline shift.    On sagittal views the posterior fossa, pituitary gland and corpus callosum  are unremarkable. No evidence of intracranial hemorrhage on SWI views. The  orbits and their contents, paranasal sinuses and calvarium are  unremarkable.  Intracranial flow voids are present.    05/11/2014   Abnormal MRI brain (without) demonstrating: - Right occipital lobe sulcal T2FLAIR hyperintensity without associated  hemorrhage on SWI views. There are 2 small acute infarcts in the right  splenium of corpus callosum and right occipital lobe. Elsewhere there are  a few scattered punctate subcortical T2 hyperintensities in the centrum  semiovale. Main considerations would include a variety cerebral  vasoconstriction syndromes, some of which may be reversible and others  which may be progressive/permanent. These may include vasculitidies  (primary CNS angitis, giant cell arteritis, granulomatous disease, other  systemic rheumatologic diseases), hypercoagulable states (autoimmune or  paraneoplastic), infectious processes (meningo-encephalitis), subarachnoid  hemorrhage, as well a group of conditions associated  with so called  reversible cerebral vasoconstriction syndrome (RCVS).  - Correlation with CSF and serum analysis, follow up  post-contrast imaging  and angiography, and clinical findings advised.     INTERPRETING PHYSICIAN:  Suanne Marker, MD Certified in Neurology, Neurophysiology and Neuroimaging  [facility] [address], [address] [place], [place] [address] [phone]   Mr Laqueta Jean Contrast  05/15/2014   CLINICAL DATA:  Recent stroke. Progressive numbness left face arm and leg last night. Mild headache.  EXAM: MRI HEAD WITH CONTRAST  TECHNIQUE: Multiplanar, multiecho pulse sequences of the brain and surrounding structures were obtained with intravenous contrast.  COMPARISON:  MRI 05/13/2014 and 05/10/2014.  CONTRAST:  20mL MULTIHANCE GADOBENATE DIMEGLUMINE 529 MG/ML IV SOLN  FINDINGS: Postcontrast imaging was performed in all 3 planes. Unenhanced T2 and diffusion-weighted imaging were not repeated however were positive in the right occipital lobe and right splenium of the corpus callosum on both of the recent MRI scans. In addition, there was occlusion of the right P3 segment.  Postcontrast imaging reveals gyriform enhancement in the right occipital cortex medially. This is more extensive than that seen on the diffusion-weighted imaging and is most consistent with subacute infarction. The abnormality in the splenium on diffusion on prior studies does not enhance. The enhancement pattern is most consistent with subacute infarct and not neoplasm.  No other areas of abnormal enhancement are identified. Ventricle size normal.  IMPRESSION: Gyriform enhancement in the right medial occipital lobe most consistent with subacute infarct. This area is larger than that seen on diffusion-weighted imaging recently.   Electronically Signed   By: Marlan Palau M.D.   On: 05/15/2014 14:11   Ir US Guide Vasc Access Right  05/16/2014   CLINICAL DATA:  Left-sided visual field deficit. Abnormal MRI of  the brain.  EXAM: BILATERAL COMMON CAROTID AND INNOMINATE ANGIOGRAPHY AND BILATERAL VERTEBRAL ARTERY ANGIOGRAMS  PROCEDURE: Contrast: 60mL OMNIPAQUE IOHEXOL 300 MG/ML  SOLN  Anesthesia/Sedation:  Conscious sedation.  Medications:  Versed 1 mg IV.  Fentanyl 25 mcg IV.  Following a full explanation of the procedure along with the potential associated complications, an informed witnessed consent was obtained.  The right groin was prepped and draped in the usual sterile fashion. Thereafter using modified Seldinger technique, transfemoral access into the right common femoral artery was obtained without difficulty. Over a 0.035 inch guidewire, a 5 French Pinnacle sheath was inserted. Through this, and also over 0.035 inch guidewire, a 5 French JB1 catheter was advanced to the aortic arch region and selectively positioned in the right common carotid artery, the right vertebral artery, the left common carotid artery and the left vertebral artery.  There were no acute complications. The patient tolerated the procedure well.  FINDINGS: The left common carotid arteriogram demonstrates the left external carotid artery and its major branches to be normal.  The left internal carotid artery at the bulb to the cranial skull base opacifies normally.  There is mild fusiform dilatation of the proximal cavernous segment probably related to chronic hypertension. Distal to this the supraclinoid segment is widely patent.  A left posterior communicating artery is seen opacifying the left posterior cerebral artery distribution.  The inferior occipital branches of the P3 region demonstrate smooth focal areas of segmental narrowing interspersed with normal caliber vessels. Similar changes are seen angiographically of the left ophthalmic artery at its bifurcation.  The left middle and the left anterior cerebral artery opacify into the capillary and venous phases.  The right vertebral artery origin is normal.  The vessel is seen to opacify  normally to the cranial skull base.  Normal opacification  is seen of the right posterior-inferior cerebellar artery and the right vertebrobasilar junction.  The basilar artery, the proximal posterior cerebral arteries, the superior cerebellar arteries and the anterior inferior cerebellar arteries are seen to opacify normally into the capillary and venous phases.  There is non filling of the right posterior cerebral artery at the distal P2 P3 segment.  The right common carotid arteriogram demonstrates the right external carotid artery and its major branches to be normal.  The right internal carotid artery at the bulb to the cranial skull base is normally opacified.  The petrous, cavernous and supraclinoid segments are widely patent.  There is mild narrowing at the supraclinoid segment just distal to the origin of the ophthalmic artery.  The right middle and the right anterior cerebral arteries opacify into the capillary and venous phases.  Cross filling via the anterior communicating artery of the left anterior cerebral artery is seen.  The left vertebral artery origin is normal. The vessel is seen to opacify normally to the cranial skull base.  There is normal opacification of left posterior inferior cerebellar artery with suggestion of the left anterior inferior cerebral artery/posterior inferior cerebellar artery complex.  The posterior cerebral arteries proximally, the superior cerebellar arteries and the anterior inferior cerebellar arteries opacify normally into the capillary and venous phases.  Again demonstrated is the non opacification of the right posterior cerebral artery distal to the P2 P3 region.  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  Tapered smooth narrowing of the distal P2 branches of the left posterior cerebral artery, and also of the left ophthalmic artery. These findings are nonspecific.  They may represent vasospasm versus vasculitis versus less likely arteriosclerosis given the patient age. Clinical correlation is suggested.   Electronically Signed   By: Julieanne Cotton M.D.   On: 05/16/2014 11:57   Mr Maxine Glenn Head/brain Wo Cm  05/13/2014   ADDENDUM REPORT: 05/13/2014 13:28  ADDENDUM: Study discussed by telephone with Dr. Loretha Brasil (covering for Dr. Lorretta Harp ) on 05/13/2014 at 1313 hrs.   Electronically Signed   By: Augusto Gamble M.D.   On: 05/13/2014 13:28   05/13/2014   CLINICAL DATA:  23 year old female with progressive chronic headaches. Abnormal brain MRI on 05/10/2014. Initial encounter.  The patient advises transient left side vision loss 2 weeks ago which improved spontaneously.  EXAM: MRI HEAD WITHOUT CONTRAST  MRA HEAD WITHOUT CONTRAST  TECHNIQUE: Multiplanar, multiecho pulse sequences of the brain and surrounding structures were obtained without intravenous contrast. Angiographic images of the head were obtained using MRA technique without contrast.  COMPARISON:  Brain MRI 05/10/2014.  Head CT 0316 hr today.  FINDINGS: MRI HEAD FINDINGS  Small foci of restricted diffusion re - identified in the posterior hemisphere, including at the right occipital pole and also in the right splenium of the corpus callosum. In this region there is abnormal increased FLAIR and trace diffusion signal in the sulci of the left occipital lobe (series 7, image 14). No associated abnormal subarachnoid signal on other sequences. These findings have not significantly changed since 3 days ago.  No other restricted diffusion identified. Subarachnoid signal elsewhere is within normal limits. Wallace Cullens and white matter signal elsewhere is stable and normal for age. No areas of abnormal susceptibility identified in the brain. No cortical encephalomalacia.  Major intracranial vascular flow voids are stable and within normal limits. No midline shift, mass effect, evidence of mass lesion, or ventriculomegaly.  Cervicomedullary junction and pituitary are within normal limits. Negative visualized cervical spine.  Nonspecific decreased T1 bone marrow signal, specially in the visible cervical spine. Visualized scalp soft tissues are within normal limits. Visible internal auditory structures appear normal. Visualized paranasal sinuses and mastoids are clear. Visualized orbit soft tissues are within normal limits.  MRA HEAD FINDINGS  Antegrade flow in the posterior circulation with codominant distal vertebral arteries. Normal right PICA and dominant appearing left AICA. Normal vertebrobasilar junction. Normal basilar artery. Normal AICA and SCA origins. Fetal type left PCA origin. Normal right PCA origin. Right posterior communicating artery is present.  Both proximal PCA branches are within normal limits, but there is abrupt occlusion of the distal right PCA just at or beyond the P3 segment, it near where the superior an inferior PCA division branches arise. Distal left PCA branches are normal.  Antegrade flow in both ICA siphons. Negative distal cervical ICAs. No siphon stenosis. Ophthalmic and posterior communicating artery origins are normal. Normal carotid termini. Normal MCA and ACA origins. Anterior communicating artery and visualized bilateral ACA branches are within normal limits. Visualized left MCA branches are within normal limits. Visualized right MCA branches are within normal limits. Distal posterior right MCA flow may be mildly hyperdynamic, an compensation for the right PCA findings.  IMPRESSION: 1. Stable abnormal signal in the right occipital lobe and at the right splenium of the corpus callosum since 05/10/2014. Some of the abnormal signal seems confined to the right occipital subarachnoid space as before. 2. However, there is distal right PCA occlusion (right P3 segments). 3. 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 4. Nonetheless, CSF analysis still may be valuable to  confirm No abnormal subarachnoid process. 5. No other MRI or intracranial MRA abnormality identified.  Electronically Signed: By: Augusto Gamble M.D. On: 05/13/2014 12:29   Ir Angio Intra Extracran Sel Com Carotid Innominate Bilat Mod Sed  05/16/2014   CLINICAL DATA:  Left-sided visual field deficit. Abnormal MRI of the brain.  EXAM: BILATERAL COMMON CAROTID AND INNOMINATE ANGIOGRAPHY AND BILATERAL VERTEBRAL ARTERY ANGIOGRAMS  PROCEDURE: Contrast: 60mL OMNIPAQUE IOHEXOL 300 MG/ML  SOLN  Anesthesia/Sedation:  Conscious sedation.  Medications:  Versed 1 mg IV.  Fentanyl 25 mcg IV.  Following a full explanation of the procedure along with the potential associated complications, an informed witnessed consent was obtained.  The right groin was prepped and draped in the usual sterile fashion. Thereafter using modified Seldinger technique, transfemoral access into the right common femoral artery was obtained without difficulty. Over a 0.035 inch guidewire, a 5 French Pinnacle sheath was inserted. Through this, and also over 0.035 inch guidewire, a 5 French JB1 catheter was advanced to the aortic arch region and selectively positioned in the right common carotid artery, the right vertebral artery, the left common carotid artery and the left vertebral artery.  There were no acute complications. The patient tolerated the procedure well.  FINDINGS: The left common carotid arteriogram demonstrates the left external carotid artery and its major branches to be normal.  The left internal carotid artery at the bulb to the cranial skull base opacifies normally.  There is mild fusiform dilatation of the proximal cavernous segment probably related to chronic hypertension. Distal to this the supraclinoid segment is widely patent.  A left posterior communicating artery is seen opacifying the left posterior cerebral artery distribution.  The inferior occipital branches of the P3 region demonstrate smooth focal areas of segmental narrowing  interspersed with normal caliber vessels.  Similar changes are seen angiographically of the left ophthalmic artery at its bifurcation.  The left middle and the left anterior cerebral artery opacify into the capillary and venous phases.  The right vertebral artery origin is normal.  The vessel is seen to opacify normally to the cranial skull base.  Normal opacification is seen of the right posterior-inferior cerebellar artery and the right vertebrobasilar junction.  The basilar artery, the proximal posterior cerebral arteries, the superior cerebellar arteries and the anterior inferior cerebellar arteries are seen to opacify normally into the capillary and venous phases.  There is non filling of the right posterior cerebral artery at the distal P2 P3 segment.  The right common carotid arteriogram demonstrates the right external carotid artery and its major branches to be normal.  The right internal carotid artery at the bulb to the cranial skull base is normally opacified.  The petrous, cavernous and supraclinoid segments are widely patent.  There is mild narrowing at the supraclinoid segment just distal to the origin of the ophthalmic artery.  The right middle and the right anterior cerebral arteries opacify into the capillary and venous phases.  Cross filling via the anterior communicating artery of the left anterior cerebral artery is seen.  The left vertebral artery origin is normal. The vessel is seen to opacify normally to the cranial skull base.  There is normal opacification of left posterior inferior cerebellar artery with suggestion of the left anterior inferior cerebral artery/posterior inferior cerebellar artery complex.  The posterior cerebral arteries proximally, the superior cerebellar arteries and the anterior inferior cerebellar arteries opacify normally into the capillary and venous phases.  Again demonstrated is the non opacification of the right posterior cerebral artery distal to the P2 P3 region.   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  Tapered smooth narrowing of the distal P2 branches of the left posterior cerebral artery, and also of the left ophthalmic artery. These findings are nonspecific. They may represent vasospasm versus vasculitis versus less likely arteriosclerosis given the patient age. Clinical correlation is suggested.   Electronically Signed   By: Julieanne Cotton M.D.   On: 05/16/2014 11:57   Ir Angio Vertebral Sel Vertebral Bilat Mod Sed  05/16/2014   CLINICAL DATA:  Left-sided visual field deficit. Abnormal MRI of the brain.  EXAM: BILATERAL COMMON CAROTID AND INNOMINATE ANGIOGRAPHY AND BILATERAL VERTEBRAL ARTERY ANGIOGRAMS  PROCEDURE: Contrast: 60mL OMNIPAQUE IOHEXOL 300 MG/ML  SOLN  Anesthesia/Sedation:  Conscious sedation.  Medications:  Versed 1 mg IV.  Fentanyl 25 mcg IV.  Following a full explanation of the procedure along with the potential associated complications, an informed witnessed consent was obtained.  The right groin was prepped and draped in the usual sterile fashion. Thereafter using modified Seldinger technique, transfemoral access into the right common femoral artery was obtained without difficulty. Over a 0.035 inch guidewire, a 5 French Pinnacle sheath was inserted. Through this, and also over 0.035 inch guidewire, a 5 French JB1 catheter was advanced to the aortic arch region and selectively positioned in the right common carotid artery, the right vertebral artery, the left common carotid artery and the left vertebral artery.  There were no acute complications. The patient tolerated the procedure well.  FINDINGS: The left common carotid arteriogram demonstrates the left external carotid artery and its major branches to be normal.  The left internal carotid artery at the bulb to the cranial skull base opacifies normally.  There  is mild fusiform dilatation of  the proximal cavernous segment probably related to chronic hypertension. Distal to this the supraclinoid segment is widely patent.  A left posterior communicating artery is seen opacifying the left posterior cerebral artery distribution.  The inferior occipital branches of the P3 region demonstrate smooth focal areas of segmental narrowing interspersed with normal caliber vessels. Similar changes are seen angiographically of the left ophthalmic artery at its bifurcation.  The left middle and the left anterior cerebral artery opacify into the capillary and venous phases.  The right vertebral artery origin is normal.  The vessel is seen to opacify normally to the cranial skull base.  Normal opacification is seen of the right posterior-inferior cerebellar artery and the right vertebrobasilar junction.  The basilar artery, the proximal posterior cerebral arteries, the superior cerebellar arteries and the anterior inferior cerebellar arteries are seen to opacify normally into the capillary and venous phases.  There is non filling of the right posterior cerebral artery at the distal P2 P3 segment.  The right common carotid arteriogram demonstrates the right external carotid artery and its major branches to be normal.  The right internal carotid artery at the bulb to the cranial skull base is normally opacified.  The petrous, cavernous and supraclinoid segments are widely patent.  There is mild narrowing at the supraclinoid segment just distal to the origin of the ophthalmic artery.  The right middle and the right anterior cerebral arteries opacify into the capillary and venous phases.  Cross filling via the anterior communicating artery of the left anterior cerebral artery is seen.  The left vertebral artery origin is normal. The vessel is seen to opacify normally to the cranial skull base.  There is normal opacification of left posterior inferior cerebellar artery with suggestion of the left anterior inferior cerebral  artery/posterior inferior cerebellar artery complex.  The posterior cerebral arteries proximally, the superior cerebellar arteries and the anterior inferior cerebellar arteries opacify normally into the capillary and venous phases.  Again demonstrated is the non opacification of the right posterior cerebral artery distal to the P2 P3 region.  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  Tapered smooth narrowing of the distal P2 branches of the left posterior cerebral artery, and also of the left ophthalmic artery. These findings are nonspecific. They may represent vasospasm versus vasculitis versus less likely arteriosclerosis given the patient age. Clinical correlation is suggested.   Electronically Signed   By: Julieanne Cotton M.D.   On: 05/16/2014 11:57    DISCHARGE EXAMINATION: Filed Vitals:   05/17/14 1429 05/17/14 1430 05/17/14 1435 05/17/14 1501  BP:  139/75 126/80 129/64  Pulse:  91 71 70  Temp: 98 F (36.7 C)   97.6 F (36.4 C)  TempSrc: Oral   Oral  Resp:  17 20 20   Height:      Weight:      SpO2:  100% 100% 100%   General appearance: alert, cooperative, appears stated age, no distress and moderately obese Resp: clear to auscultation bilaterally Cardio: regular rate and rhythm, S1, S2 normal, no murmur, click, rub or gallop GI: soft, non-tender; bowel sounds normal; no masses,  no organomegaly Extremities: extremities normal, atraumatic, no cyanosis or edema  DISPOSITION: Home  Discharge Instructions    Call MD for:  difficulty breathing, headache or visual disturbances    Complete by:  As directed      Call MD for:  extreme fatigue  Complete by:  As directed      Call MD for:  persistant dizziness or light-headedness    Complete by:  As directed      Call MD for:  persistant nausea and vomiting    Complete by:  As directed      Call MD for:  severe uncontrolled pain     Complete by:  As directed      Call MD for:  temperature >100.4    Complete by:  As directed      Diet - low sodium heart healthy    Complete by:  As directed      Discharge instructions    Complete by:  As directed   I have left a message with Dr. Patsy Lager scheduler Tyler Aas) for an appointment regarding the Protein C and S levels. He is a Acupuncturist. If you don't get a call from them, please call [phone] or your PCP for further advise.     Increase activity slowly    Complete by:  As directed            ALLERGIES: No Known Allergies  Current Discharge Medication List    START taking these medications   Details  aspirin EC 325 MG tablet Take 1 tablet (325 mg total) by mouth daily. Qty: 30 tablet, Refills: 3      CONTINUE these medications which have CHANGED   Details  atorvastatin (LIPITOR) 20 MG tablet Take 1 tablet (20 mg total) by mouth daily. Qty: 30 tablet, Refills: 3      CONTINUE these medications which have NOT CHANGED   Details  naproxen (NAPROSYN) 500 MG tablet Take 1 tablet (500 mg total) by mouth 2 (two) times daily with a meal. Qty: 30 tablet, Refills: 0    ondansetron (ZOFRAN ODT) 4 MG disintegrating tablet Take 1 tablet (4 mg total) by mouth every 8 (eight) hours as needed for nausea or vomiting. Qty: 10 tablet, Refills: 0    sodium chloride (OCEAN) 0.65 % SOLN nasal spray Place 1 spray into both nostrils daily as needed for congestion.    eletriptan (RELPAX) 40 MG tablet Take 1 tablet (40 mg total) by mouth as needed for migraine or headache. One tablet by mouth at onset of headache. May repeat in 2 hours if headache persists or recurs. Qty: 10 tablet, Refills: 0      STOP taking these medications     norgestimate-ethinyl estradiol (ORTHO-CYCLEN,SPRINTEC,PREVIFEM) 0.25-35 MG-MCG tablet      phenylephrine (SUDAFED PE) 10 MG TABS tablet      topiramate (TOPAMAX) 50 MG tablet        Follow-up Information    Follow up with Altamese Celina,  MD. Schedule an appointment as soon as possible for a visit in 1 week.   Specialty:  Family Medicine   Why:  post hospitalization follow up. Please ask your doctor about repeating the Protein c and S levels in 4 weeks if you are unable to see a hematologist sson.   Contact information:   [address] [place] [address] [phone]       TOTAL DISCHARGE TIME: 35 mins  [facility]  [facility] Pager [phone]  05/17/2014, 3:32 PM

## 2014-05-17 NOTE — Progress Notes (Signed)
Discharge orders received. Pt for discharge home today. IV d/c'd. Pt given discharge instructions and prescriptions with verbalized understanding. Significant other in room to assist with discharge. Staff walked pt downstairs.

## 2014-05-18 ENCOUNTER — Encounter (HOSPITAL_COMMUNITY): Payer: Self-pay | Admitting: Cardiovascular Disease

## 2014-05-18 LAB — CSF IGG: IGG CSF: 2 mg/dL (ref 0.8–7.7)

## 2014-05-19 LAB — VDRL, CSF: VDRL Quant, CSF: NONREACTIVE

## 2014-05-19 LAB — CSF CULTURE W GRAM STAIN: Culture: NO GROWTH

## 2014-05-19 LAB — OLIGOCLONAL BANDS, CSF + SERM

## 2014-05-19 LAB — CSF CULTURE

## 2014-05-20 LAB — PROTHROMBIN GENE MUTATION

## 2014-05-20 LAB — FACTOR 5 LEIDEN

## 2014-05-23 ENCOUNTER — Ambulatory Visit (HOSPITAL_COMMUNITY): Payer: Medicaid Other

## 2014-05-25 LAB — ALPHA GALACTOSIDASE: Alpha galactosidase, serum: 0.257 U/L (ref 0.074–0.457)

## 2014-05-30 ENCOUNTER — Telehealth: Payer: Self-pay | Admitting: *Deleted

## 2014-05-30 NOTE — Telephone Encounter (Signed)
Patient calling for test results, checked chart which are abnormal, informed patient that Dr Lucia GaskinsAhern is out of the office until Thursday and when she returns she will call her back.

## 2014-06-02 NOTE — Telephone Encounter (Signed)
Called and scheduled an appt for next week.

## 2014-06-02 NOTE — Telephone Encounter (Signed)
Angel [name] - would you schedule her for a 30 minute appointment next week? If I am booked I will open up a 4pm for her. Thank you.

## 2014-06-02 NOTE — Telephone Encounter (Signed)
Patient is still having headaches and would like for Dr. Lucia GaskinsAhern to review her hospital notes and tell her what her next steps are. Please call cell phone at [phone]

## 2014-06-06 ENCOUNTER — Encounter: Payer: Self-pay | Admitting: Neurology

## 2014-06-06 ENCOUNTER — Ambulatory Visit (INDEPENDENT_AMBULATORY_CARE_PROVIDER_SITE_OTHER): Payer: Medicaid Other | Admitting: Neurology

## 2014-06-06 VITALS — BP 136/79 | HR 107 | Ht 68.0 in | Wt 271.6 lb

## 2014-06-06 DIAGNOSIS — I639 Cerebral infarction, unspecified: Secondary | ICD-10-CM

## 2014-06-06 DIAGNOSIS — E538 Deficiency of other specified B group vitamins: Secondary | ICD-10-CM

## 2014-06-06 DIAGNOSIS — R5382 Chronic fatigue, unspecified: Secondary | ICD-10-CM

## 2014-06-06 DIAGNOSIS — D6859 Other primary thrombophilia: Secondary | ICD-10-CM

## 2014-06-06 NOTE — Patient Instructions (Signed)
Overall you are doing fairly well but I do want to suggest a few things today:   Remember to drink plenty of fluid, eat healthy meals and do not skip any meals. Try to eat protein with a every meal and eat a healthy snack such as fruit or nuts in between meals. Try to keep a regular sleep-wake schedule and try to exercise daily, particularly in the form of walking, 20-30 minutes a day, if you can.   As far as your medications are concerned, I would like to suggest: continue Aspirin daily  As far as diagnostic testing: Labs today, Hematology referral  I would like to see you back in 3 months, sooner if we need to. Please call us with any interim questions, concerns, problems, updates or refill requests.   Please also call us for any test results so we can go over those with you on the phone.  My clinical assistant and will answer any of your questions and relay your messages to me and also relay most of my messages to you.   Our phone number is [phone]. We also have an after hours call service for urgent matters and there is a physician on-call for urgent questions. For any emergencies you know to call 911 or go to the nearest emergency room

## 2014-06-06 NOTE — Progress Notes (Signed)
[facility]    Provider:  Dr Lucia GaskinsAhern Referring Provider: Altamese [name], Angel D, MD Primary Care Physician:  Angel [name],Angel D, MD  CC:  stroke  HPI:  Angel [name] is a 24 y.o. female here as a referral from Dr. Daphine DeutscherMartin for stroke. Headaches are better. She in on asa 81mg . She is here today to review her test results after being admitted to the hospital.   Reviewed notes, labs and imaging from outside physicians, which showed: [facility]. Following labs were WNL: alpha-galactosidase, ptt, csf cell cnt and culture, csf gram stain, csf afb culture, csf protein and glucose, csf vrl and cryptococcal antigen, csf oligoclonal bands, csg igg index, b12 (249), folate, c3/c4, rpr, hiv, antithrombiniii, proteinc activity,lupus anticoagulant, b2glycoprotein abs, homocysteine, factor 5 leiden, prothrombin gener mutation, cardiolipin abs, ana, inr.   MRI brain: Gyriform enhancement in the right medial occipital lobe most consistent with subacute infarct.   Protein s activity low 39. hgba1c 6.2, ldl 72  MRI brain 12/16 Right occipital lobe sulcal T2FLAIR hyperintensity without associated hemorrhage on SWI views. 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 Elsewhere there are a few scattered punctate subcortical T2 hyperintensities in the centrum semiovale. Main considerations would include a variety cerebral vasoconstriction syndromes, some of which may be reversible and others which may be progressive/permanent. These may include vasculitidies (primary CNS angitis, giant cell arteritis, granulomatous disease, other systemic rheumatologic diseases), hypercoagulable states (autoimmune or paraneoplastic), infectious processes (meningo-encephalitis), subarachnoid hemorrhage, as well a group of conditions associated with so called reversible cerebral vasoconstriction syndrome (RCVS).   MRI / MRA Brain Wo Contrast  05/13/14 1. Stable abnormal signal in the right occipital lobe and at the right splenium of the corpus callosum since 05/10/2014. Some of the abnormal signal seems confined to the right occipital subarachnoid space as before. 2. However, there is distal right PCA occlusion (right P3 segments).  3. 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  4. Nonetheless, CSF analysis still may be valuable to confirm No abnormal subarachnoid process.  5. No other MRI or intracranial MRA abnormality identified.   Reviewed all images and agree with results above.  MRI with contrast 05/15/14 Gyriform enhancement in the right medial occipital lobe most consistent with subacute infarct. This area is larger than that seen on diffusion-weighted imaging recently.  CUS - Bilateral: intimal wall thickening. Mild soft plaque origin ICA. 1-39% ICA stenosis. Unable to visualize right vertebral flow. Left vertebral flow is antegrade. ICA/CCA ratio: R-0.89 L-0.63.  TCD bubble study - negative  LE venous doppler - negative  TEE - Normal TEE  Cerebral angio - 1.smooth focal Segmental areas of narrowing of LT PCA p3 (I think it should be right PCA P3) branches. Non specific vasospam vs vasculitis vs arterioslerosis.  Review of Systems: Patient complains of symptoms per HPI as well as the following symptoms: headache. No CP, no SOB. Pertinent negatives per HPI. All others negative.   History   Social History  . Marital Status: Single    Spouse Name: N/A    Number of Children: 1  . Years of Education: college   Occupational History  .  Other    [employer]   Social History Main Topics  . Smoking status: Never Smoker   . Smokeless tobacco: Never Used  . Alcohol Use: No  . Drug Use: No  . Sexual Activity: Yes  Birth Control/ Protection: Pill   Other Topics Concern  . Not on file   Social History Narrative   Patient lives at home with family.    Caffeine Use: 2 sodas daily    Family History  Problem Relation Age of Onset  . Cancer Paternal Uncle   . Diabetes Paternal Uncle   . Heart disease Maternal Grandmother   . Heart disease Maternal Grandfather   . Diabetes Paternal Grandmother     Past Medical History  Diagnosis Date  . Anemia   . Headache   . Obesity   . Anxiety     Past Surgical History  Procedure Laterality Date  . None    . Tee without cardioversion N/A 05/17/2014    Procedure: TRANSESOPHAGEAL ECHOCARDIOGRAM (TEE);  Surgeon: Wendall Stade, MD;  Location: [facility] ENDOSCOPY;  Service: Cardiovascular;  Laterality: N/A;    Current Outpatient Prescriptions  Medication Sig Dispense Refill  . ALPRAZolam (XANAX) 1 MG tablet Take 1 mg by mouth 3 (three) times daily.    Marland Kitchen aspirin EC 325 MG tablet Take 1 tablet (325 mg total) by mouth daily. 30 tablet 3  . atorvastatin (LIPITOR) 20 MG tablet Take 1 tablet (20 mg total) by mouth daily. 30 tablet 3  . gabapentin (NEURONTIN) 300 MG capsule Take 300 mg by mouth 3 (three) times daily. Patient takes as needed    . sodium chloride (OCEAN) 0.65 % SOLN nasal spray Place 1 spray into both nostrils daily as needed for congestion.     No current facility-administered medications for this visit.    Allergies as of 06/06/2014  . (No Known Allergies)    Vitals:B12  BP 136/79 mmHg  Pulse 107  Ht  (1.727 m)  Wt 271 lb 9.6 oz (123.197 kg)  BMI 41.31 kg/m2  LMP 04/29/2014 Last Weight:  Wt Readings from Last 1 Encounters:  06/06/14 271 lb 9.6 oz (123.197 kg)   Last Height:   Ht Readings from Last 1 Encounters:  06/06/14  (1.727 m)   Physical exam: Exam: Gen: NAD, conversant, well nourised, obese, well groomed                     CV: RRR, no MRG. No Carotid Bruits. No peripheral edema, warm, nontender Eyes: Conjunctivae clear without exudates or hemorrhage  Neuro: Detailed Neurologic Exam  Speech:    Speech is normal; fluent and spontaneous with normal  comprehension.  Cognition:    The patient is oriented to person, place, and time;     recent and remote memory intact;     language fluent;     normal attention, concentration,     fund of knowledge Cranial Nerves:    The pupils are equal, round, and reactive to light. The fundi are normal and spontaneous venous pulsations are present. Visual fields are full to finger confrontation. Extraocular movements are intact. Trigeminal sensation is intact and the muscles of mastication are normal. The face is symmetric. The palate elevates in the midline. Hearing intact. Voice is normal. Shoulder shrug is normal. The tongue has normal motion without fasciculations.  Gait:    Normal native gait  Motor Observation:    No asymmetry, no atrophy, and no involuntary movements noted. Tone:    Normal muscle tone.    Posture:    Posture is normal. normal erect    Strength:    Strength is V/V in the upper and lower limbs.      Sensation: intact  to LT     Assessment/Plan:  Angel [name] is a 24 y.o. female with history of migraine headaches, obesity, anemia and family history of coagulopathy, with left-sided numbness. 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 Extensive workup for above causes revealed low protein s activity. She does endorse a family history of clotting disorder due to multiple strokes.   hga1c 6.2 - needs to follow with pcp for management of risk factors such as glucose intolerance ldl 72, goal < 70. Suggest  weight loss and good diet, lipitor  daily b12 low normal, suggest daily vitamin and repeat B12 at next appointment Continue aspirin daily  Blood work to check for hemoglobinopathies, tsh, pan-anca which were not done inpatient Referral to hematology for low portein-s activity Obesity: recommend weight loss. Discussed BCPs cause increase risk of venous thrombosis. She has stopped.    Naomie Dean, MD  [facility] [address] [place], [place] [address]  Phone [phone] Fax [phone]  A total of 30 minutes was spent in with this patient. Over half this time was spent on counseling patient on the stroke diagnosis and different diagnostic and therapeutic options available.

## 2014-06-07 ENCOUNTER — Encounter: Payer: Self-pay | Admitting: Neurology

## 2014-06-07 ENCOUNTER — Telehealth: Payer: Medicaid Other | Admitting: Physician Assistant

## 2014-06-07 DIAGNOSIS — R519 Headache, unspecified: Secondary | ICD-10-CM

## 2014-06-07 DIAGNOSIS — R51 Headache: Principal | ICD-10-CM

## 2014-06-07 NOTE — Progress Notes (Signed)
We are sorry that you are not feeling well.  Here is how we plan to help!  Based on what you have shared with me it looks like you will need a face to face visit for complicated/severe symptoms which could represent a more serious problem.  As per your chart, you recently suffered a stroke and have been suffering migraines since, having just seen your Neurologist yesterday.  I highly encourage you to go see your primary doctor for an exam, evaluation and management.  If anything acutely worsens before that appointment, please go to an Urgent Care or ER facility.  If you are having a true medical emergency please call 911.  If you need an urgent face to face visit, [place] has four urgent care centers for your convenience.  Tressie Ellis. [facility]  [phone] Get Driving Directions Find a Provider at this Location  [address] [place], [place] [address] . 8 am to 8 pm Monday-Friday . 9 am to 7 pm Saturday-Sunday  . [facility]  [phone] Get Driving Directions Find a Provider at this Location  [address] [place], [place] [address] . 8 am to 8 pm Monday-Friday . 9 am to 6 pm Saturday . 11 am to 6 pm Sunday   . [facility] at [facility]  [phone] Get Driving Directions  [address] [place], [place] [address] . 8 am to 8 pm Monday-Friday . 9 am to 4 pm Saturday-Sunday   . [facility] (a walk in primary care provider)  [phone]  Get Driving Directions Find a Provider at this Location  [address] [place], [place] [address] . 8 am to 8:30 pm Monday-Thursday . 8 am to 6 pm Friday . 8 am to 4 pm Saturday-Sunday  Your e-visit answers were reviewed by a board certified advanced clinical practitioner to complete your personal care plan.  Depending on the condition, your plan could have included both over the counter or prescription medications.  You  will get an e-mail in the next two days asking about your experience.  I hope that your e-visit has been valuable and will speed your recovery . Thank you for choosing an e-visit.

## 2014-06-09 ENCOUNTER — Telehealth: Payer: Self-pay | Admitting: Neurology

## 2014-06-09 NOTE — Telephone Encounter (Signed)
Patient requesting blood work results.  Please call and advise. °

## 2014-06-10 ENCOUNTER — Emergency Department (HOSPITAL_COMMUNITY): Payer: Medicaid Other

## 2014-06-10 ENCOUNTER — Telehealth: Payer: Medicaid Other | Admitting: Physician Assistant

## 2014-06-10 ENCOUNTER — Encounter (HOSPITAL_COMMUNITY): Payer: Self-pay | Admitting: Cardiology

## 2014-06-10 ENCOUNTER — Emergency Department (HOSPITAL_COMMUNITY)
Admission: EM | Admit: 2014-06-10 | Discharge: 2014-06-10 | Disposition: A | Discharge status: Home / Self Care | Payer: Medicaid Other | Attending: Emergency Medicine | Admitting: Emergency Medicine

## 2014-06-10 DIAGNOSIS — E669 Obesity, unspecified: Secondary | ICD-10-CM | POA: Insufficient documentation

## 2014-06-10 DIAGNOSIS — Z8673 Personal history of transient ischemic attack (TIA), and cerebral infarction without residual deficits: Secondary | ICD-10-CM | POA: Insufficient documentation

## 2014-06-10 DIAGNOSIS — F419 Anxiety disorder, unspecified: Secondary | ICD-10-CM | POA: Insufficient documentation

## 2014-06-10 DIAGNOSIS — Z862 Personal history of diseases of the blood and blood-forming organs and certain disorders involving the immune mechanism: Secondary | ICD-10-CM | POA: Insufficient documentation

## 2014-06-10 DIAGNOSIS — R51 Headache: Secondary | ICD-10-CM | POA: Insufficient documentation

## 2014-06-10 DIAGNOSIS — R519 Headache, unspecified: Secondary | ICD-10-CM

## 2014-06-10 DIAGNOSIS — Z79899 Other long term (current) drug therapy: Secondary | ICD-10-CM | POA: Insufficient documentation

## 2014-06-10 DIAGNOSIS — J329 Chronic sinusitis, unspecified: Secondary | ICD-10-CM | POA: Insufficient documentation

## 2014-06-10 DIAGNOSIS — Z7982 Long term (current) use of aspirin: Secondary | ICD-10-CM | POA: Diagnosis not present

## 2014-06-10 HISTORY — DX: Cerebral infarction, unspecified: I63.9

## 2014-06-10 MED ORDER — OXYMETAZOLINE HCL 0.05 % NA SOLN
1.0000 | Freq: Once | NASAL | Status: AC
  Administered 2014-06-10: 1 via NASAL
  Filled 2014-06-10: qty 15

## 2014-06-10 MED ORDER — AMOXICILLIN 500 MG PO CAPS: 1000.0000 mg | ORAL_CAPSULE | Freq: Two times a day (BID) | ORAL | Status: DC

## 2014-06-10 NOTE — ED Provider Notes (Signed)
CSN: [id number]     Arrival date & time 06/10/14  1336 History   First MD Initiated Contact with Patient 06/10/14 1743     Chief Complaint  Patient presents with  . Sinusitis  . Headache     (Consider location/radiation/quality/duration/timing/severity/associated sxs/prior Treatment) HPI  Pt presenting with c/o frontal headache and sinus congestion.  Pt states she has had sinus congestion for the past 2 weeks and feels sinus pressure. Headache began 2 days ago.  Headache was gradual in onset.  No changes in vision or speech.  No weakness or numbness of extremities.  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  She states her PMD advised her to come to the ED for evaluation due to her history of prior stroke and current headache.  There are no other associated systemic symptoms, there are no other alleviating or modifying factors.   Past Medical History  Diagnosis Date  . Anemia   . Headache   . Obesity   . Anxiety   . Stroke    Past Surgical History  Procedure Laterality Date  . None    . Tee without cardioversion N/A 05/17/2014    Procedure: TRANSESOPHAGEAL ECHOCARDIOGRAM (TEE);  Surgeon: Wendall StadePeter C Nishan, MD;  [facility];  Service: Cardiovascular;  Laterality: N/A;   Family History  Problem Relation Age of Onset  . Cancer Paternal Uncle   . Diabetes Paternal Uncle   . Heart disease Maternal Grandmother   . Heart disease Maternal Grandfather   . Diabetes Paternal Grandmother    History  Substance Use Topics  . Smoking status: Never Smoker   . Smokeless tobacco: Never Used  . Alcohol Use: No   OB History    No data available     Review of Systems  ROS reviewed and all otherwise negative except for mentioned in HPI    Allergies  Review of patient's allergies indicates no known allergies.  Home Medications   Prior to Admission  medications   Medication Sig Start Date End Date Taking? Authorizing Provider  ALPRAZolam Prudy Feeler(XANAX) 1 MG tablet Take 1 mg by mouth 3 (three) times daily as needed for anxiety.    Yes Historical Provider, MD  aspirin EC 325 MG tablet Take 1 tablet (325 mg total) by mouth daily. 05/17/14  Yes Osvaldo ShipperGokul Krishnan, MD  atorvastatin (LIPITOR) 20 MG tablet Take 1 tablet (20 mg total) by mouth daily. Patient taking differently: Take 10 mg by mouth daily.  05/17/14  Yes Osvaldo ShipperGokul Krishnan, MD  diphenhydrAMINE (BENADRYL) 25 MG tablet Take 50 mg by mouth daily as needed (sinus headache).   Yes Historical Provider, MD  gabapentin (NEURONTIN) 300 MG capsule Take 300 mg by mouth daily as needed (headaches).    Yes Historical Provider, MD  sodium chloride (OCEAN) 0.65 % SOLN nasal spray Place 1 spray into both nostrils 2 (two) times daily as needed for congestion.    Yes Historical Provider, MD  amoxicillin (AMOXIL) 500 MG capsule Take 2 capsules (1,000 mg total) by mouth 2 (two) times daily. 06/10/14   Ethelda ChickMartha K Linker, MD   BP 129/65 mmHg  Pulse 88  Temp(Src) 97.9 F (36.6 C)  Resp 18  Ht 5\' 9"  (1.753 m)  Wt 271 lb (122.925 kg)  BMI 40.00 kg/m2  SpO2 100%  LMP 05/13/2014  Vitals reviewed Physical Exam  Physical Examination: General appearance - alert, well  appearing, and in no distress Mental status - alert, oriented to person, place, and time Eyes - pupils equal and reactive, extraocular eye movements intact Mouth - mucous membranes moist, pharynx normal without lesions Neck - supple, no significant adenopathy Chest - clear to auscultation, no wheezes, rales or rhonchi, symmetric air entry Heart - normal rate, regular rhythm, normal S1, S2, no murmurs, rubs, clicks or gallops Abdomen - soft, nontender, nondistended, no masses or organomegaly Neurological - alert, oriented x 3, cranial nerves 2-12 tested and intact, strength 5/5 in extremities x 4, sensation intat Extremities - peripheral pulses normal, no  pedal edema, no clubbing or cyanosis Skin - normal coloration and turgor, no rashes  ED Course  Procedures (including critical care time) Labs Review Labs Reviewed - No data to display  Imaging Review Ct Head Wo Contrast  06/10/2014   CLINICAL DATA:  Acute onset of headache for several days. Sinus pressure. Initial encounter.  EXAM: CT HEAD WITHOUT CONTRAST  TECHNIQUE: Contiguous axial images were obtained from the base of the skull through the vertex without intravenous contrast.  COMPARISON:  CT of the head performed 05/14/2014, and MRI of the brain performed 05/15/2014  FINDINGS: There is no evidence of acute infarction, mass lesion, or intra- or extra-axial hemorrhage on CT.  Chronic infarct is noted at the medial right occipital lobe. No new infarcts are seen.  The posterior fossa, including the cerebellum, brainstem and fourth ventricle, is within normal limits. The third and lateral ventricles, and basal ganglia are unremarkable in appearance. The cerebral hemispheres are symmetric in appearance, with normal gray-white differentiation. No mass effect or midline shift is seen.  There is no evidence of fracture; visualized osseous structures are unremarkable in appearance. The visualized portions of the orbits are within normal limits. The paranasal sinuses and mastoid air cells are well-aerated. No significant soft tissue abnormalities are seen.  IMPRESSION: 1. No acute intracranial pathology seen on CT. The visualized paranasal sinuses and mastoid air cells are well aerated. 2. Chronic infarct at the medial right occipital lobe, as previously noted.   Electronically Signed   By: Roanna Raider M.D.   On: 06/10/2014 21:15     EKG Interpretation None      MDM   Final diagnoses:  Headache    Pt presenting with frontal headache and sinus pressure.  Pt has ttp over maxillary sinuses.  Normal neuro exam.  Doubt another CVA given this presentation.  She is arranging for followup with  hematology now- in the process. Will give amoxicillin in case of sinus infection as symptoms have been ongoing x 2 weeks, also given afrin for decongestion.  Discharged with strict return precautions.  Pt agreeable with plan.   Ethelda Chick, MD 06/10/14 [phone]

## 2014-06-10 NOTE — Progress Notes (Signed)
Based on what you shared with me it looks like you have a serious condition that should be evaluated in a face to face office visit.  Your symptoms do sound like a sinus infection, but giving your significant headache and history of migraines and recent strokes, you need an examination and evaluation in person with a healthcare provider to make sure nothing more serious is going on.  I think an Urgent Care would be sufficient but I highly recommend you get seen today.  If anything acutely worsens, please proceed to the nearest ER or call 911.  If you are having a true medical emergency please call 911.  If you need an urgent face to face visit, [place] has four urgent care centers for your convenience.  Angel [name]. [facility]  [phone] Get Driving Directions Find a Provider at this Location  [address] [place], [place] [address] . 8 am to 8 pm Monday-Friday . 9 am to 7 pm Saturday-Sunday  . [facility]  [phone] Get Driving Directions Find a Provider at this Location  [address] [place], [place] [address] . 8 am to 8 pm Monday-Friday . 9 am to 6 pm Saturday . 11 am to 6 pm Sunday   . [facility] at [facility]  [phone] Get Driving Directions  [address] [place], [place] [address] . 8 am to 8 pm Monday-Friday . 9 am to 4 pm Saturday-Sunday   . [facility] (a walk in primary care provider)  [phone]  Get Driving Directions Find a Provider at this Location  [address] [place], [place] [address] . 8 am to 8:30 pm Monday-Thursday . 8 am to 6 pm Friday . 8 am to 4 pm Saturday-Sunday   Your e-visit answers were reviewed by a board certified advanced clinical practitioner to complete your personal care plan.  Depending on the condition, your plan could have included both over the counter or prescription medications.  You will get an e-mail in  the next two days asking about your experience.  I hope that your e-visit has been valuable and will speed your recovery . Thank you for choosing an e-visit.

## 2014-06-10 NOTE — ED Notes (Signed)
Pt reports that she has had a headache for the past couple of days. States she has had sinus pressure. Reports her PCP wanted her to be checked out because she had an MRI showing a stroke back in decemeber.

## 2014-06-10 NOTE — Discharge Instructions (Signed)
Return to the ED with any concerns including weakness or numbness of arms or legs, changes in vision or speech, fainting, vomiting, seizure activity, decreased level of alertness/lethargy, or any other alarming symptoms  You should use the afrin 2 sprays twice daily for no more than 3 days, using longer than this may cause your congestion to worsen

## 2014-06-12 LAB — FUNGUS CULTURE W SMEAR: FUNGAL SMEAR: NONE SEEN

## 2014-06-13 ENCOUNTER — Telehealth: Payer: Self-pay | Admitting: Neurology

## 2014-06-13 LAB — THYROID PANEL WITH TSH
FREE THYROXINE INDEX: 1.3 (ref 1.2–4.9)
T3 UPTAKE RATIO: 22 % — AB (ref 24–39)
T4, Total: 6 ug/dL (ref 4.5–12.0)
TSH: 1.41 u[IU]/mL (ref 0.450–4.500)

## 2014-06-13 LAB — HEMOGLOBINOPATHY EVALUATION
Free Hemoglobin, Plasma: NEGATIVE
HGB S: 0 %
Hgb A2 Quant: 1.1 % (ref 0.7–3.1)
Hgb A: 97.8 % (ref 94.0–98.0)
Hgb C: 0 %
Hgb F Quant: 0 % (ref 0.0–2.0)
Hgb Variant: 1.1 %

## 2014-06-13 LAB — PAN-ANCA
ANCA Proteinase 3: <3.5 U/mL (ref 0.0–3.5)
Atypical pANCA: <1:20 {titer}
Myeloperoxidase Ab: <9 U/mL (ref 0.0–9.0)
P-ANCA: <1:20 {titer}

## 2014-06-13 LAB — PROTEIN S PANEL
PROTEIN S ACTIVITY: 64 % (ref 60–145)
PROTEIN S AG TOTAL: 121 % (ref 58–150)
Protein S Ag, Free: 66 % (ref 56–124)

## 2014-06-13 LAB — METHYLMALONIC ACID, SERUM: Methylmalonic Acid: 375 nmol/L (ref 0–378)

## 2014-06-13 NOTE — Telephone Encounter (Signed)
Patient is calling and states she received a call in regard to her test results. Please advise what she can do until she sees hemotologists.

## 2014-06-13 NOTE — Telephone Encounter (Signed)
FYI

## 2014-06-14 ENCOUNTER — Telehealth: Payer: Medicaid Other | Admitting: Family

## 2014-06-14 DIAGNOSIS — K219 Gastro-esophageal reflux disease without esophagitis: Secondary | ICD-10-CM

## 2014-06-14 MED ORDER — OMEPRAZOLE 20 MG PO CPDR: 20.0000 mg | DELAYED_RELEASE_CAPSULE | Freq: Every day | ORAL | Status: DC

## 2014-06-14 NOTE — Progress Notes (Signed)
We are sorry that you are not feeling well.  Here is how we plan to help!  Based on what you shared with me it looks like you most likely have Gastroesophageal Reflux Disease (GERD)  Gastroesophageal reflux disease (GERD) happens when acid from your stomach flows up into the esophagus.  When acid comes in contact with the esophagus, the acid causes sorenss (inflammation) in the esophagus.  Over time, GERD may create small holes (ulcers) in the lining of the esophagus.  I have prescribed Omeprazole, a Protein Pump inhibitor, 20 mg daily until you follow up with a provider.  Your symptoms should improve in the next day or two.  You can use antacids as needed until symptoms resolve.  Call us if your heartburn worsens, you have trouble swallowing, weight loss, spitting up blood or recurrent vomiting.  Home Care:  May include lifestyle changes such as weight loss, quitting smoking and alcohol consumption  Avoid foods and drinks that make your symptoms worse, such as:  Caffeine or alcoholic drinks  Chocolate  Peppermint or mint flavorings  Garlic and onions  Spicy foods  Citrus fruits, such as oranges, lemons, or limes  Tomato-based foods such as sauce, chili, salsa and pizza  Fried and fatty foods  Avoid lying down for 3 hours prior to your bedtime or prior to taking a nap  Eat small, frequent meals instead of a large meals  Wear loose-fitting clothing.  Do not wear anything tight around your waist that causes pressure on your stomach.  Raise the head of your bed 6 to 8 inches with wood blocks to help you sleep.  Extra pillows will not help.  Seek Help Right Away If:  You have pain in your arms, neck, jaw, teeth or back  Your pain increases or changes in intensity or duration  You develop nausea, vomiting or sweating (diaphoresis)  You develop shortness of breath or you faint  Your vomit is green, yellow, black or looks like coffee grounds or blood  Your stool is red,  bloody or black  These symptoms could be signs of other problems, such as heart disease, gastric bleeding or esophageal bleeding.  Make sure you :  Understand these instructions.  Will watch your condition.  Will get help right away if you are not doing well or get worse.  Your e-visit answers were reviewed by a board certified advanced clinical practitioner to complete your personal care plan.  Depending on the condition, your plan could have included both over the counter or prescription medications.  If there is a problem please reply  once you have received a response from your provider.  Your safety is important to us.  If you have drug allergies check your prescription carefully.    You can use MyChart to ask questions about today's visit, request a non-urgent call back, or ask for a work or school excuse.  You will get an e-mail in the next two days asking about your experience.  I hope that your e-visit has been valuable and will speed your recovery. Thank you for using e-visits.    

## 2014-06-14 NOTE — Telephone Encounter (Signed)
Spoke to patient and relayed lab results, discussed protein s activity and what that lab means. She will continue to take daily asa and other medications and she has an appointment with Dr. Cyndie ChimeGranfortuna in February.

## 2014-06-19 ENCOUNTER — Telehealth: Payer: Medicaid Other | Admitting: Family

## 2014-06-19 ENCOUNTER — Encounter: Payer: Self-pay | Admitting: Neurology

## 2014-06-19 DIAGNOSIS — K219 Gastro-esophageal reflux disease without esophagitis: Secondary | ICD-10-CM

## 2014-06-19 NOTE — Progress Notes (Signed)
Based on what you shared with me it looks like you have a serious condition that should be evaluated in a face to face office visit.  However, based on our conversation and your presentation of symptoms, I am recommending Ranitidine (zantac) 150mg  daily and TUMS (2 tabs) three times daily as needed for your heartburn, until you can visit your doctor. Avoid spicy foods for now.   If you are having a true medical emergency please call 911.  If you need an urgent face to face visit, [place] has four urgent care centers for your convenience.  Tressie Ellis. [facility]  [phone] Get Driving Directions Find a Provider at this Location  [address] [place], [place] [address] . 8 am to 8 pm Monday-Friday . 9 am to 7 pm Saturday-Sunday  . [facility]  [phone] Get Driving Directions Find a Provider at this Location  [address] [place], [place] [address] . 8 am to 8 pm Monday-Friday . 9 am to 6 pm Saturday . 11 am to 6 pm Sunday   . [facility]  [phone] Get Driving Directions  [address] [place], [place] [address] . 8 am to 8 pm Monday-Friday . 9 am to 4 pm Saturday-Sunday   . [facility] (a walk in primary care provider)  [phone]  Get Driving Directions Find a Provider at this Location  [address] [place], [place] [address] . 8 am to 8:30 pm Monday-Thursday . 8 am to 6 pm Friday . 8 am to 4 pm Saturday-Sunday   Your e-visit answers were reviewed by a board certified advanced clinical practitioner to complete your personal care plan.  Depending on the condition, your plan could have included both over the counter or prescription medications.  You will get an e-mail in the next two days asking about your experience.  I hope that your e-visit has been valuable and will speed your recovery . Thank you for choosing an e-visit.

## 2014-06-20 ENCOUNTER — Encounter: Payer: Self-pay | Admitting: Neurology

## 2014-06-23 NOTE — Telephone Encounter (Signed)
Spoke to patient. She is aware that she does not have to repeat TTE w/ bubble study.

## 2014-06-27 LAB — AFB CULTURE WITH SMEAR (NOT AT ARMC)

## 2014-07-04 ENCOUNTER — Encounter: Payer: Self-pay | Admitting: Neurology

## 2014-07-06 ENCOUNTER — Ambulatory Visit: Payer: Medicaid Other | Admitting: Cardiovascular Disease

## 2014-07-10 ENCOUNTER — Encounter: Payer: Medicaid Other | Admitting: Oncology

## 2014-07-13 ENCOUNTER — Encounter: Payer: Self-pay | Admitting: Neurology

## 2014-07-17 ENCOUNTER — Ambulatory Visit (INDEPENDENT_AMBULATORY_CARE_PROVIDER_SITE_OTHER): Payer: Medicaid Other | Admitting: Oncology

## 2014-07-17 ENCOUNTER — Encounter: Payer: Self-pay | Admitting: Oncology

## 2014-07-17 VITALS — BP 128/72 | HR 76 | Temp 97.7°F | Ht 67.5 in | Wt 277.0 lb

## 2014-07-17 DIAGNOSIS — D6859 Other primary thrombophilia: Secondary | ICD-10-CM

## 2014-07-17 DIAGNOSIS — I639 Cerebral infarction, unspecified: Secondary | ICD-10-CM

## 2014-07-17 LAB — COMPREHENSIVE METABOLIC PANEL
ALT: 9 U/L (ref 0–35)
AST: 11 U/L (ref 0–37)
Albumin: 3.6 g/dL (ref 3.5–5.2)
Alkaline Phosphatase: 96 U/L (ref 39–117)
BILIRUBIN TOTAL: 0.3 mg/dL (ref 0.2–1.2)
BUN: 12 mg/dL (ref 6–23)
CO2: 24 mEq/L (ref 19–32)
Calcium: 9.1 mg/dL (ref 8.4–10.5)
Chloride: 104 mEq/L (ref 96–112)
Creat: 0.71 mg/dL (ref 0.50–1.10)
Glucose, Bld: 74 mg/dL (ref 70–99)
POTASSIUM: 4.6 meq/L (ref 3.5–5.3)
SODIUM: 138 meq/L (ref 135–145)
Total Protein: 7.5 g/dL (ref 6.0–8.3)

## 2014-07-17 LAB — D-DIMER, QUANTITATIVE (NOT AT ARMC): D DIMER QUANT: 2.07 ug{FEU}/mL — AB (ref 0.00–0.48)

## 2014-07-17 LAB — SAVE SMEAR

## 2014-07-17 LAB — LACTATE DEHYDROGENASE: LDH: 169 U/L (ref 94–250)

## 2014-07-17 NOTE — Patient Instructions (Signed)
To lab today Return visit 1st available 3-4 weeks

## 2014-07-17 NOTE — Progress Notes (Signed)
Patient ID: Angel [name], female   DOB: 09/15/1990, 24 y.o.   MRN: [id number] New Patient Hematology   Angel [name] [id number] 1990/07/10 23 y.o. 07/17/2014  CC: Dr. Viann Shove; Dr. 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 About a year ago she started to develop migraine headaches. These were severe and persistent. They could last for hours. They were not associated with any aura or any flashing lights. They were poorly responsive to medications including Topamax. There were no initial visual changes until 05/10/2014 when she experienced blurred vision. A non-contrast MRI of the brain showed increased FLAIR signal in the right occipital lobe, two small acute infarcts in the right splenium of the corpus callosum and right occipital lobe, and a few scattered punctate subcortical T2 hyperintensities in the centrum semi-ovale. 3 days later on December 19 she experienced a 2 minute episode of amaurosis fugax involving the left eye. Husband brought her to the emergency department. A noncontrast CT scan of the brain was unremarkable. An MR ANGIOGRAM  showed a distal right posterior cerebral artery occlusion felt to be post ischemic in nature. Bilateral common carotid and innominate artery and bilateral vertebral artery angiograms were done via a right femoral approach on December 22. Only abnormal finding was nonvisualization of the right posterior cerebral artery at the P2 P3 region consistent with "angiographic occlusion of the artery" at that level. There was "tapered smooth narrowing of the distal P2 branches of the left posterior cerebral artery and also of the left ophthalmic artery. These findings are nonspecific. They may represent  vasospasm versus vasculitis versus less likely arteriosclerosis given the patient age". Subsequent  imaging showed a  normal transthoracic echocardiogram with no signs of an atrial septal defect or patent foramen ovale, no mitral valve prolapse, done December 20. Transesophageal echocardiogram done December 23 confirmed these findings with a negative bubble study. Negative bubble study also confirmed by transcranial Doppler. Venous Doppler studies of the lower extremities were normal.  A lumbar puncture was performed. Cell count, culture, Gram stain, protein, glucose, VDRL, cryptococcal antigen, alpha galactosidase, normal. No abnormal oligoclonal bands.  A number of special hematology studies was done. Protein S antigen 51% (control value 60-1 50), protein S activity 39%(69-129), total protein C 67%   (72-160), protein C activity 88%  (75-133), anti-thrombin 76% (75-120). She tested negative for anticardiolipin antibodies, antibodies to beta 2 glycoprotein 1, and negative lupus anticoagulant. Negative for the prothrombin gene and factor V Leiden gene mutations. Normal plasma homocysteine. HIV negative. RPR negative. C3, C4, and CH 50 complement levels all elevated not decreased. ANA negative.  She was taking oral contraceptives starting in August 2015 but headaches antedated the OCs. She denies any history of miscarriage. Her first pregnancy complicated by preeclampsia near term. She has a healthy 14-year-old daughter. She has no signs or symptoms of a collagen vascular disorder and denies any hair loss, skin sensitivity, or polyarthralgias. No paresthesias. She has no history of hepatitis, yellow jaundice, or mononucleosis.  Her mother had an acute arterial thrombus to a lower extremity at age 53 requiring emergency embolectomy and is on a anticoagulant. She also has a history of peptic ulcer disease. There is a questionable history of blood clots in a maternal grandmother, grandfather, and uncle. A maternal aunt has migraines. The uncle had a heart attack and an autopsy was also found to have blood  clots in his  lungs. She has 3 brothers aged 69, 22, and 12 and a sister age 15 who share the same parents. None of them have had problems with blood clots. Father is in good health at age 23.   PMH: Past Medical History  Diagnosis Date  . Anemia   . Headache   . Obesity   . Anxiety   . Stroke   She has reflux esophagitis. No known ulcer. Upper endoscopy is planned however. No diabetes. No asthma or emphysema. No thyroid disease. No history of hepatitis yellow jaundice or mononucleosis. No history of lupus or rheumatoid arthritis.  Past Surgical History  Procedure Laterality Date  . None    . Tee without cardioversion N/A 05/17/2014    Procedure: TRANSESOPHAGEAL ECHOCARDIOGRAM (TEE);  Surgeon: Wendall Stade, MD;  Location: [facility] ENDOSCOPY;  Service: Cardiovascular;  Laterality: N/A;    Allergies: No Known Allergies  Medications:  Current outpatient prescriptions:  .  ALPRAZolam (XANAX) 1 MG tablet, Take 1 mg by mouth 3 (three) times daily as needed for anxiety. , Disp: , Rfl:  .  amoxicillin (AMOXIL) 500 MG capsule, Take 2 capsules (1,000 mg total) by mouth 2 (two) times daily., Disp: 28 capsule, Rfl: 0 .  aspirin EC 325 MG tablet, Take 1 tablet (325 mg total) by mouth daily., Disp: 30 tablet, Rfl: 3 .  atorvastatin (LIPITOR) 20 MG tablet, Take 1 tablet (20 mg total) by mouth daily. (Patient taking differently: Take 10 mg by mouth daily. ), Disp: 30 tablet, Rfl: 3 .  diphenhydrAMINE (BENADRYL) 25 MG tablet, Take 50 mg by mouth daily as needed (sinus headache)., Disp: , Rfl:  .  gabapentin (NEURONTIN) 300 MG capsule, Take 300 mg by mouth daily as needed (headaches). , Disp: , Rfl:  .  omeprazole (PRILOSEC) 20 MG capsule, Take 1 capsule (20 mg total) by mouth daily., Disp: 30 capsule, Rfl: 1 .  sodium chloride (OCEAN) 0.65 % SOLN nasal spray, Place 1 spray into both nostrils 2 (two) times daily as needed for congestion. , Disp: , Rfl:    Social History: She works as a Agricultural engineer. Married  and husband accompanies her today. She has one healthy daughter age 38.  reports that she has quit smoking. She has never used smokeless tobacco. She reports that she drinks alcohol. She reports that she does not use illicit drugs.  Family History: Family History  Problem Relation Age of Onset  . Cancer Paternal Uncle   . Diabetes Paternal Uncle   . Heart disease Maternal Grandmother   . Heart disease Maternal Grandfather   . Diabetes Paternal Grandmother     Review of Systems: See history of present illness Headaches have subsided significantly since the December episode of amaurosis fugax. Remaining ROS negative.  Physical Exam: Blood pressure 128/72, pulse 76, temperature 97.7 F (36.5 C), temperature source Oral, height 5' 7.5" (1.715 m), weight 277 lb (125.646 kg), SpO2 100 %. Wt Readings from Last 3 Encounters:  07/17/14 277 lb (125.646 kg)  06/10/14 271 lb (122.925 kg)  06/06/14 271 lb 9.6 oz (123.197 kg)     General appearance: pleasant AA woman HENNT: Pharynx no erythema, exudate, mass, or ulcer. Question symmetric thyromegaly no thyroid nodules Lymph nodes: No cervical, supraclavicular, or axillary lymphadenopathy Breasts:  Lungs: Clear to auscultation, resonant to percussion throughout Heart: Regular rhythm, no murmur, no gallop, no rub, no click, no edema Abdomen: Obese, Soft, nontender, normal bowel sounds, no mass, no organomegaly Extremities: No edema,  no calf tenderness Musculoskeletal: no joint deformities GU:  Vascular: Carotid pulses 2+, no bruits, radial and ulnar pulses 1+ symmetric, distal pulses: Dorsalis pedis 1+ symmetric Neurologic: Alert, oriented, PERRLA, optic discs sharp and vessels normal, no hemorrhage or exudate, cranial nerves grossly normal, motor strength 5 over 5, reflexes 1+ symmetric, upper body coordination normal, gait normal, vibration sense normal over the fingertips by tuning fork exam Skin: No rash or ecchymosis    Lab  Results: Lab Results  Component Value Date   WBC 7.7 05/16/2014   HGB 9.0* 05/16/2014   HCT 31.1* 05/16/2014   MCV 72.7* 05/16/2014   PLT 409* 05/16/2014     Chemistry      Component Value Date/Time   NA 140 05/16/2014 0604   K 4.3 05/16/2014 0604   CL 107 05/16/2014 0604   CO2 24 05/16/2014 0604   BUN 9 05/16/2014 0604   CREATININE 0.86 05/16/2014 0604      Component Value Date/Time   CALCIUM 8.9 05/16/2014 0604   ALKPHOS 79 05/13/2014 0339   AST 27 05/13/2014 0339   ALT 35 05/13/2014 0339   BILITOT <0.2* 05/13/2014 0339       Review of peripheral blood film: pending   Radiological Studies: See HPI    Impression: #1. Multiple small cortical brain infarcts  Although initial protein S levels are low, it would be very unusual to see arterial events, especially in the cerebral circulation, related to protein S deficiency which is almost exclusively associated with venous thrombotic events in the extremities and occasionally the mesenteric vessels. Clots in the cerebral circulation when they do occur tend to occur in the cerebral vein sinuses. Although she may have a congenital coagulopathy based on her mother's history of arterial thrombus, I do not believe it is related to the low protein S levels. She does not appear to have an acquired protein S deficiency with negative lupus anticoagulant, anticardiolipin antibodies, and negative antibodies to beta 2 glycoprotein 1 and negative ANA. She doesn't have any systemic signs or symptoms or laboratory tests to suggest that she has vasculitis.  Recommendation: I'm going to repeat the protein S levels to see if they are reproducible. If they are, I would have to consider the addition of warfarin to her regimen to give her maximum protection and the benefit of the doubt. It would be helpful to test other family members as well.  #2. Simple iron deficiency anemia No reason to believe that this is at all related to the problem  above.       Levert FeinsteinGRANFORTUNA,JAMES M, MD 07/17/2014, 4:02 PM

## 2014-07-18 LAB — CBC WITH DIFFERENTIAL/PLATELET
BASOS PCT: 0 % (ref 0–1)
Basophils Absolute: 0 10*3/uL (ref 0.0–0.1)
Eosinophils Absolute: 0.3 10*3/uL (ref 0.0–0.7)
Eosinophils Relative: 4 % (ref 0–5)
HCT: 30.2 % — ABNORMAL LOW (ref 36.0–46.0)
HEMOGLOBIN: 8.5 g/dL — AB (ref 12.0–15.0)
LYMPHS ABS: 2.4 10*3/uL (ref 0.7–4.0)
Lymphocytes Relative: 29 % (ref 12–46)
MCH: 19.2 pg — AB (ref 26.0–34.0)
MCHC: 28.1 g/dL — AB (ref 30.0–36.0)
MCV: 68.3 fL — ABNORMAL LOW (ref 78.0–100.0)
MONO ABS: 0.4 10*3/uL (ref 0.1–1.0)
MONOS PCT: 5 % (ref 3–12)
MPV: 9 fL (ref 8.6–12.4)
Neutro Abs: 5.2 10*3/uL (ref 1.7–7.7)
Neutrophils Relative %: 62 % (ref 43–77)
Platelets: 668 10*3/uL — ABNORMAL HIGH (ref 150–400)
RBC: 4.42 MIL/uL (ref 3.87–5.11)
RDW: 19.4 % — AB (ref 11.5–15.5)
WBC: 8.4 10*3/uL (ref 4.0–10.5)

## 2014-07-18 LAB — RETICULOCYTES
ABS RETIC: 61.9 10*3/uL (ref 19.0–186.0)
RBC.: 4.42 MIL/uL (ref 3.87–5.11)
RETIC CT PCT: 1.4 % (ref 0.4–2.3)

## 2014-07-18 LAB — PROTEIN S ACTIVITY: Protein S Activity: 67 % (ref 60–145)

## 2014-07-18 LAB — SEDIMENTATION RATE: SED RATE: 34 mm/h — AB (ref 0–20)

## 2014-07-18 LAB — PROTEIN S, TOTAL: Protein S Ag, Total: 123 % (ref 58–150)

## 2014-07-19 ENCOUNTER — Encounter: Payer: Self-pay | Admitting: Oncology

## 2014-07-25 ENCOUNTER — Ambulatory Visit: Payer: Medicaid Other | Admitting: Cardiology

## 2014-08-01 ENCOUNTER — Encounter: Payer: Self-pay | Admitting: Cardiology

## 2014-08-01 ENCOUNTER — Ambulatory Visit (INDEPENDENT_AMBULATORY_CARE_PROVIDER_SITE_OTHER): Payer: Medicaid Other | Admitting: Oncology

## 2014-08-01 ENCOUNTER — Encounter: Payer: Self-pay | Admitting: Oncology

## 2014-08-01 VITALS — BP 129/80 | HR 103 | Temp 98.3°F | Ht 67.5 in | Wt 279.7 lb

## 2014-08-01 DIAGNOSIS — D500 Iron deficiency anemia secondary to blood loss (chronic): Secondary | ICD-10-CM

## 2014-08-01 DIAGNOSIS — I639 Cerebral infarction, unspecified: Secondary | ICD-10-CM

## 2014-08-01 MED ORDER — POLYSACCHARIDE IRON COMPLEX 150 MG PO CAPS: 150.0000 mg | ORAL_CAPSULE | Freq: Two times a day (BID) | ORAL | Status: DC

## 2014-08-01 NOTE — Patient Instructions (Signed)
Begin Niferex iron tablets 150 mg twice daily Return 6 months lab 1 week before visit

## 2014-08-02 NOTE — Progress Notes (Signed)
Patient ID: Angel [name], female   DOB: [birth year], 24 y.o.   MRN: [id number] The patient comes in with her husband today to review outstanding laboratory results obtained for further evaluation of unexplained stroke in an otherwise healthy 24 year old woman. Please see my consultation note dated 07/17/2014 for complete details of her medical history and exam. Briefly, she has a history of migraine headaches poorly responsive to medications. She presented with an acute change in her vision in mid December 2015 and was found to have areas of microinfarction in the right occipital lobe, right splenium of the corpus callosum, with additional punctate T2 hyperintense areas in the centrum semi-ovale. A few days after her initial symptoms of blurred vision she had what sounds like an episode of amaurosis fugax involving the left eye lasting for about 2 minutes. There was nonvisualization of a segment  the right posterior cerebral artery on MR angiogram. No cardiac source of emboli was found on echocardiogram or transcranial Doppler study. Lumbar puncture was normal. She was on oral contraceptives but her headaches antedated use of these. A hypercoagulation profile was normal except for decrease in protein S activity of 39% with control values above 69%. A concomitant protein C level was normal. Plasma homocystine normal. ANA negative. She tested negative for the presence of a lupus type anticoagulant and had no antibodies against anticardiolipin or beta-2 glycoprotein 1. RPR negative. ANCA negative. Baseline coagulation studies PT and PTT normal. Family history remarkable for probable arterial embolus in her mother at age 25 requiring emergency embolectomy.  I felt that the decreased protein S level was likely unrelated to her stroke. Thrombotic events associated with Protein S deficiency almost exclusively limited to the venous circulation. She has now had two  repeat lab assessments of protein S and both of  them were normal: 06/06/2014: Free Protein S  66% control 56-124, protein S activity 64% control 60-145, protein S antigen 121% control 58-150 07/17/2014: Total protein S 123, protein S activity 67%  Impression: Idiopathic cerebral microinfarcts  At this point, most likely explanation is cerebral artery vasospasm with resulting ischemia related to severe migraine. It is still possible that she does have a congenital or acquired coagulopathy that has not yet been discovered. However, there is not enough evidence at this time to make a recommendation that she add a full dose anticoagulant to her current antiplatelet agent.  I did not schedule a formal follow-up visit but I left the door open if she has problems in the future if I can be of any assistance.  Approximately 25 minutes spent with the patient and her husband with direct face-to-face contact. Complete physical exam and medical history done at time of consultation 2 weeks ago and not repeated today.

## 2014-08-07 ENCOUNTER — Other Ambulatory Visit: Payer: Self-pay | Admitting: Family

## 2014-08-08 NOTE — Telephone Encounter (Signed)
E-visit patient

## 2014-08-10 ENCOUNTER — Emergency Department (HOSPITAL_COMMUNITY)
Admission: EM | Admit: 2014-08-10 | Discharge: 2014-08-11 | Disposition: A | Discharge status: Home / Self Care | Payer: Medicaid Other | Attending: Emergency Medicine | Admitting: Emergency Medicine

## 2014-08-10 ENCOUNTER — Telehealth: Payer: Self-pay | Admitting: Neurology

## 2014-08-10 ENCOUNTER — Emergency Department (HOSPITAL_COMMUNITY): Payer: Medicaid Other

## 2014-08-10 ENCOUNTER — Encounter (HOSPITAL_COMMUNITY): Payer: Self-pay | Admitting: Emergency Medicine

## 2014-08-10 DIAGNOSIS — M542 Cervicalgia: Secondary | ICD-10-CM | POA: Diagnosis not present

## 2014-08-10 DIAGNOSIS — R51 Headache: Secondary | ICD-10-CM | POA: Insufficient documentation

## 2014-08-10 DIAGNOSIS — Z8673 Personal history of transient ischemic attack (TIA), and cerebral infarction without residual deficits: Secondary | ICD-10-CM | POA: Diagnosis not present

## 2014-08-10 DIAGNOSIS — R519 Headache, unspecified: Secondary | ICD-10-CM

## 2014-08-10 MED ORDER — IOHEXOL 350 MG/ML SOLN
100.0000 mL | Freq: Once | INTRAVENOUS | Status: AC | PRN
  Administered 2014-08-10: 100 mL via INTRAVENOUS

## 2014-08-10 NOTE — Telephone Encounter (Signed)
Given her complicated history it is probably best for her to be seen in the emergency room, due to one sided Sx and acute onset.

## 2014-08-10 NOTE — Telephone Encounter (Signed)
Talked with pt and she is having a sharp pain in her head and neck on right side. It started earlier today. Having right arm pain. Takes aspirin daily. Pt anxious. Told her I would talk with Dr. Frances FurbishAthar to see if she can fit her in and see her. Pt verbalized understanding. Told her I would call her back.

## 2014-08-10 NOTE — ED Notes (Signed)
Pt with Hx of CVA December 2015, unknown etiology, reports to ED for head and neck pain onset 2 hours ago. Nuero exam WDL.

## 2014-08-10 NOTE — ED Provider Notes (Signed)
CSN: [id number]     Arrival date & time 08/10/14  1543 History   First MD Initiated Contact with Patient 08/10/14 1959     Chief Complaint  Patient presents with  . Headache  . Neck Pain     (Consider location/radiation/quality/duration/timing/severity/associated sxs/prior Treatment) Patient is a 24 y.o. female presenting with headaches. The history is provided by the patient. No language interpreter was used.  Headache Pain location:  Generalized Radiates to:  R neck Onset quality:  Gradual Duration:  1 day Timing:  Constant Progression:  Worsening Chronicity:  New Similar to prior headaches: no   Relieved by:  Nothing Worsened by:  Nothing Ineffective treatments:  None tried Associated symptoms: neck pain   Associated symptoms: no vomiting   Risk factors: no anger and no family hx of SAH   Pt complains of a headache on the right side of her head and pain in the right side of her neck.  Pt had a cva in December (3 months ago)   Pt reports started the same way with a headache.    Past Medical History  Diagnosis Date  . Anemia   . Headache   . Obesity   . Anxiety   . Stroke    Past Surgical History  Procedure Laterality Date  . None    . Tee without cardioversion N/A 05/17/2014    Procedure: TRANSESOPHAGEAL ECHOCARDIOGRAM (TEE);  Surgeon: Wendall Stade, MD;  Location: [facility] ENDOSCOPY;  Service: Cardiovascular;  Laterality: N/A;   Family History  Problem Relation Age of Onset  . Cancer Paternal Uncle   . Diabetes Paternal Uncle   . Heart disease Maternal Grandmother   . Heart disease Maternal Grandfather   . Diabetes Paternal Grandmother    History  Substance Use Topics  . Smoking status: Former Games developer  . Smokeless tobacco: Never Used  . Alcohol Use: 0.0 oz/week    0 Standard drinks or equivalent per week     Comment: Occasionally.   OB History    No data available     Review of Systems  Gastrointestinal: Negative for vomiting.  Musculoskeletal: Positive  for neck pain.  Neurological: Positive for headaches.  All other systems reviewed and are negative.     Allergies  Review of patient's allergies indicates no known allergies.  Home Medications   Prior to Admission medications   Medication Sig Start Date End Date Taking? Authorizing Provider  ALPRAZolam Prudy Feeler) 1 MG tablet Take 1 mg by mouth 3 (three) times daily as needed for anxiety.    Yes Historical Provider, MD  aspirin EC 325 MG tablet Take 1 tablet (325 mg total) by mouth daily. 05/17/14  Yes Osvaldo Shipper, MD  atorvastatin (LIPITOR) 20 MG tablet Take 1 tablet (20 mg total) by mouth daily. 05/17/14  Yes Osvaldo Shipper, MD  iron polysaccharides (NIFEREX) 150 MG capsule Take 1 capsule (150 mg total) by mouth 2 (two) times daily. 08/01/14  Yes Levert Feinstein, MD  omeprazole (PRILOSEC) 20 MG capsule TAKE 1 CAPSULE (20 MG TOTAL) BY MOUTH DAILY. 08/08/14  Yes Junie Spencer, FNP  amoxicillin (AMOXIL) 500 MG capsule Take 2 capsules (1,000 mg total) by mouth 2 (two) times daily. Patient not taking: Reported on 08/10/2014 06/10/14   Jerelyn Scott, MD   BP 135/70 mmHg  Pulse 88  Temp(Src) 97.9 F (36.6 C) (Oral)  Resp 20  SpO2 100%  LMP 07/12/2014 Physical Exam  Constitutional: She appears well-developed and well-nourished.  HENT:  Head: Normocephalic.  Right Ear: External ear normal.  Left Ear: External ear normal.  Nose: Nose normal.  Mouth/Throat: Oropharynx is clear and moist.  Eyes: Conjunctivae are normal. Pupils are equal, round, and reactive to light.  Neck: Normal range of motion. Neck supple.  Cardiovascular: Normal rate and regular rhythm.   Pulmonary/Chest: Effort normal and breath sounds normal.  Abdominal: Soft.  Musculoskeletal: Normal range of motion.  Neurological: She is alert. She has normal reflexes.  Skin: Skin is warm.  Psychiatric: She has a normal mood and affect.  Nursing note and vitals reviewed.   ED Course  Procedures (including critical  care time) Labs Review Labs Reviewed - No data to display  Imaging Review No results found.   EKG Interpretation None      MDM  I discussed pt with Dr. Thad Rangereynolds who advised Cta head and neck. Radiologist called and recommended mri.  I spoke to Dr. Jodi Mourningeynold who advised MRi.   I spoke with [facility] De[facility].   Pt agrees to transfer.   Final diagnoses:  Headache        Elson AreasLeslie K Sofia, PA-C 08/11/14 0040  Vanetta MuldersScott Zackowski, MD 08/11/14 [phone]

## 2014-08-10 NOTE — ED Notes (Signed)
Angel LauthPlunkett, MD, made aware of pt CVA Hx and symptoms.

## 2014-08-10 NOTE — Telephone Encounter (Signed)
Pt is calling stating she had 2 strokes in December, now she is having some pain in her neck and the back of her head.  She is concerned and would like for a nurse to give her a call back as soon as possible.

## 2014-08-10 NOTE — Telephone Encounter (Signed)
Talked with pt and told her to go to the ER per Dr. Frances FurbishAthar recommendation. Pt verbalized understanding.

## 2014-08-11 ENCOUNTER — Telehealth: Payer: Medicaid Other | Admitting: Nurse Practitioner

## 2014-08-11 ENCOUNTER — Emergency Department (HOSPITAL_COMMUNITY): Payer: Medicaid Other

## 2014-08-11 DIAGNOSIS — J0100 Acute maxillary sinusitis, unspecified: Secondary | ICD-10-CM

## 2014-08-11 MED ORDER — AZITHROMYCIN 250 MG PO TABS: ORAL_TABLET | ORAL | Status: DC

## 2014-08-11 MED ORDER — GADOBENATE DIMEGLUMINE 529 MG/ML IV SOLN
20.0000 mL | Freq: Once | INTRAVENOUS | Status: AC | PRN
  Administered 2014-08-11: 20 mL via INTRAVENOUS

## 2014-08-11 NOTE — ED Notes (Signed)
Pt reports new onset right sided neck and head pain, pt reports this is where her previous stroke was. Pt states [facility] reported to her that she had something that needed further evaluation on the left side of her head and neck.

## 2014-08-11 NOTE — Progress Notes (Signed)

## 2014-08-11 NOTE — Discharge Instructions (Signed)
MRI without any new or acute changes. Reviewed with our neural hospitalist Dr. Thad Rangereynolds. You are cleared for discharge home. Follow-up with your doctors.

## 2014-08-11 NOTE — ED Notes (Signed)
Patient transported to MRI 

## 2014-08-11 NOTE — ED Notes (Signed)
Patient instructed to go directly to [facility]. Patient with IV to Right AC. Patient accompanied by significant other. Patient has been instructed no food or drink, no stopping on the way to [facility] and confirms understanding. Patient sent with EMTALA and Transfer signature page in sealed envelope.

## 2014-08-28 ENCOUNTER — Telehealth: Payer: Medicaid Other | Admitting: Family

## 2014-08-28 DIAGNOSIS — R197 Diarrhea, unspecified: Secondary | ICD-10-CM

## 2014-08-28 NOTE — Progress Notes (Signed)
We are sorry that you are not feeling well.  Here is how we plan to help!  Based on what you have shared with me it looks like you have Acute Infectious Diarrhea.  Most cases of acute diarrhea are due to infections with virus and bacteria and are self-limited conditions lasting less than 14 days.  For your symptoms you may take Imodium 2 mg tablets that are over the counter at your local pharmacy. Take two tablet now and then one after each loose stool up to 6 a day.  Antibiotics are not needed for most people with diarrhea.  *If your diarrhea worsens, you will need to follow up with your primary care provider.  HOME CARE  We recommend changing your diet to help with your symptoms for the next few days.  Drink plenty of fluids that contain water salt and sugar. Sports drinks such as Gatorade may help.   You may try broths, soups, bananas, applesauce, soft breads, mashed potatoes or crackers.   You are considered infectious for as long as the diarrhea continues. Hand washing or use of alcohol based hand sanitizers is recommend.  It is best to stay out of work or school until your symptoms stop.   GET HELP RIGHT AWAY  If you have dark yellow colored urine or do not pass urine frequently you should drink more fluids.    If your symptoms worsen   If you feel like you are going to pass out (faint)  You have a new problem  MAKE SURE YOU   Understand these instructions.  Will watch your condition.  Will get help right away if you are not doing well or get worse.  Your e-visit answers were reviewed by a board certified advanced clinical practitioner to complete your personal care plan.  Depending on the condition, your plan could have included both over the counter or prescription medications.  If there is a problem please reply  once you have received a response from your provider.  Your safety is important to us.  If you have drug allergies check your prescription carefully.     You can use MyChart to ask questions about today's visit, request a non-urgent call back, or ask for a work or school excuse.  You will get an e-mail in the next two days asking about your experience.  I hope that your e-visit has been valuable and will speed your recovery. Thank you for using e-visits.

## 2014-08-29 NOTE — Progress Notes (Signed)
No sho

## 2014-08-31 ENCOUNTER — Telehealth: Payer: Medicaid Other | Admitting: Physician Assistant

## 2014-08-31 ENCOUNTER — Other Ambulatory Visit: Payer: Self-pay | Admitting: Family

## 2014-08-31 DIAGNOSIS — M546 Pain in thoracic spine: Secondary | ICD-10-CM

## 2014-08-31 MED ORDER — NAPROXEN 500 MG PO TABS: 500.0000 mg | ORAL_TABLET | Freq: Two times a day (BID) | ORAL | Status: DC

## 2014-08-31 MED ORDER — CYCLOBENZAPRINE HCL 10 MG PO TABS: 10.0000 mg | ORAL_TABLET | Freq: Three times a day (TID) | ORAL | Status: DC | PRN

## 2014-08-31 NOTE — Progress Notes (Signed)
We are sorry that you are not feeling well.  Here is how we plan to help!  Based on what you have shared with me it looks like you mostly have acute back pain.  Acute back pain is defined as musculoskeletal pain that can resolve in 1-3 weeks with conservative treatment.  I have prescribed Naprosyn 500 mg twice a day non-steroid anti-inflammatory (NSAID) as well as Flexeril 10 mg every eight hours as needed which is a muscle relaxer.  Some patients experience stomach irritation or in increased heartburn with anti-inflammatory drugs.  Please keep in mind that muscle relaxer's can cause fatigue and should not be taken while at work or driving.  Back pain is very common.  The pain often gets better over time.  The cause of back pain is usually not dangerous.  Most people can learn to manage their back pain on their own.  Home Care  Stay active.  Start with short walks on flat ground if you can.  Try to walk farther each day.  Do not sit, drive or stand in one place for more than 30 minutes.  Do not stay in bed.  Do not avoid exercise or work.  Activity can help your back heal faster.  Be careful when you bend or lift an object.  Bend at your knees, keep the object close to you, and do not twist.  Sleep on a firm mattress.  Lie on your side, and bend your knees.  If you lie on your back, put a pillow under your knees.  Only take medicines as told by your doctor.  Put ice on the injured area.  Put ice in a plastic bag  Place a towel between your skin and the bag  Leave the ice on for 15-20 minutes, 3-4 times a day for the first 2-3 days.  After that, you can switch between ice and heat packs.  Ask your doctor about back exercises or massage.  Avoid feeling anxious or stressed.  Find good ways to deal with stress, such as exercise.  Get Help Right Way If:  Your pain does not go away with rest or medicine.  Your pain does not go away in 1 week.  You have new problems.  You do not  feel well.  The pain spreads into your legs.  You cannot control when you poop (bowel movement) or pee (urinate)  You feel sick to your stomach (nauseous) or throw up (vomit)  You have belly (abdominal) pain.  You feel like you may pass out (faint).  If you develop a fever.  Make Sure you:  Understand these instructions.  Will watch your condition  Will get help right away if you are not doing well or get worse.  Your e-visit answers were reviewed by a board certified advanced clinical practitioner to complete your personal care plan.  Depending on the condition, your plan could have included both over the counter or prescription medications.  If there is a problem please reply  once you have received a response from your provider.  Your safety is important to us.  If you have drug allergies check your prescription carefully.    You can use MyChart to ask questions about today's visit, request a non-urgent call back, or ask for a work or school excuse.  You will get an e-mail in the next two days asking about your experience.  I hope that your e-visit has been valuable and will speed your recovery. Thank you   for using e-visits.   

## 2014-09-04 ENCOUNTER — Encounter: Payer: Self-pay | Admitting: Neurology

## 2014-09-04 ENCOUNTER — Ambulatory Visit (INDEPENDENT_AMBULATORY_CARE_PROVIDER_SITE_OTHER): Payer: Medicaid Other | Admitting: Neurology

## 2014-09-04 VITALS — BP 124/75 | HR 85 | Temp 97.7°F | Ht 67.5 in | Wt 280.5 lb

## 2014-09-04 DIAGNOSIS — I67841 Reversible cerebrovascular vasoconstriction syndrome: Secondary | ICD-10-CM | POA: Diagnosis not present

## 2014-09-04 MED ORDER — ASPIRIN EC 325 MG PO TBEC: 325.0000 mg | DELAYED_RELEASE_TABLET | Freq: Every day | ORAL | Status: DC

## 2014-09-04 MED ORDER — ATORVASTATIN CALCIUM 20 MG PO TABS
20.0000 mg | ORAL_TABLET | Freq: Every day | ORAL | Status: DC
Start: 2014-09-04 — End: 2019-10-17

## 2014-09-04 MED ORDER — ALPRAZOLAM 1 MG PO TABS: 1.0000 mg | ORAL_TABLET | Freq: Three times a day (TID) | ORAL | Status: DC | PRN

## 2014-09-04 NOTE — Progress Notes (Signed)
[id number]    Provider:  Dr Lucia GaskinsAhern Referring Provider: Alwyn PeaMartin, Tanya, MD Primary Care Physician:  Altamese CarolinaMARTIN,[name], MD  CC:  strokes  HPI:  Angel [name] is a 24 y.o. female here as a follow up.   She woke up from a nap, had neck pain, sharp pain in the back of the right neck, her neck started hrtig on the right side. She went to the ED and imaging was OK. She is feeling better. She is going back to school starting may 18th. She is taking aspirin every day.  She follws with Dr. Cyndie Chimegranfortuna for her iron deficiency. 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 Discussed new MRI.  MRI brain 3/18: personally reviewed images with patient  IMPRESSION: No acute ischemia.  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  Mild to moderate RIGHT-sided white matter changes, advanced for age though nonspecific may reflect chronic small vessel ischemic disease, less likely migrainous disorder, unlikely to reflect demyelination.  06/06/2014: Headaches are better. She in on asa 81mg . She is here today to review her test results after being admitted to the hospital.   Reviewed notes, labs and imaging from outside physicians, which showed: [facility]. Following labs were WNL: alpha-galactosidase, ptt, csf cell cnt and culture, csf gram stain, csf afb culture, csf protein and glucose, csf vrl and cryptococcal antigen, csf oligoclonal bands, csg igg index, b12 (249), folate, c3/c4, rpr, hiv, antithrombiniii, proteinc activity,lupus anticoagulant, b2glycoprotein abs, homocysteine, factor 5 leiden, prothrombin gener mutation, cardiolipin abs, ana, inr.   MRI brain: Gyriform enhancement in the right medial occipital lobe most consistent with subacute infarct.   Protein s activity low 39. hgba1c 6.2, ldl 72  MRI brain  12/16 Right occipital lobe sulcal T2FLAIR hyperintensity without associated hemorrhage on SWI views. 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 Elsewhere there are a few scattered punctate subcortical T2 hyperintensities in the centrum semiovale. Main considerations would include a variety cerebral vasoconstriction syndromes, some of which may be reversible and others which may be progressive/permanent. These may include vasculitidies (primary CNS angitis, giant cell arteritis, granulomatous disease, other systemic rheumatologic diseases), hypercoagulable states (autoimmune or paraneoplastic), infectious processes (meningo-encephalitis), subarachnoid hemorrhage, as well a group of conditions associated with so called reversible cerebral vasoconstriction syndrome (RCVS).   MRI / MRA Brain Wo Contrast 05/13/14 1. Stable abnormal signal in the right occipital lobe and at the right splenium of the corpus callosum since 05/10/2014. Some of the abnormal signal seems confined to the right occipital subarachnoid space as before. 2. However, there is distal right PCA occlusion (right P3 segments).  3. 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  4. Nonetheless, CSF analysis still may be valuable to confirm No abnormal subarachnoid process.  5. No other MRI or intracranial MRA abnormality identified.   Reviewed all images and agree with results above.  MRI with contrast 05/15/14 Gyriform enhancement in the right medial occipital lobe most consistent with subacute infarct. This area is larger than that seen on diffusion-weighted imaging recently.  CUS - Bilateral: intimal wall thickening. Mild soft plaque origin ICA. 1-39% ICA stenosis. Unable to visualize right vertebral flow. Left vertebral flow is antegrade. ICA/CCA ratio: R-0.89 L-0.63.  TCD bubble study - negative  LE venous doppler - negative  TEE -  Normal TEE  Cerebral angio - 1.smooth focal Segmental areas of narrowing of LT PCA p3 (I think it should be right PCA P3) branches. Non specific vasospam vs vasculitis vs arterioslerosis.   Review of Systems: Patient complains of symptoms per HPI as well as the following symptoms: No SOB, no CP, Iron Deficiency. Pertinent negatives per HPI. All others negative.   History   Social History  . Marital Status: Single    Spouse Name: N/A  . Number of Children: 1  . Years of Education: college   Occupational History  .  Other    [employer]   Social History Main Topics  . Smoking status: Former Games developer  . Smokeless tobacco: Never Used  . Alcohol Use: 0.0 oz/week    0 Standard drinks or equivalent per week     Comment: Occasionally.  . Drug Use: No  . Sexual Activity: Not on file   Other Topics Concern  . Not on file   Social History Narrative   Patient lives at home with family.   Caffeine Use: 2 sodas daily    Family History  Problem Relation Age of Onset  . Cancer Paternal Uncle   . Diabetes Paternal Uncle   . Heart disease Maternal Grandmother   . Heart disease Maternal Grandfather   . Diabetes Paternal Grandmother     Past Medical History  Diagnosis Date  . Anemia   . Headache   . Obesity   . Anxiety   . Stroke     Past Surgical History  Procedure Laterality Date  . None    . Tee without cardioversion N/A 05/17/2014    Procedure: TRANSESOPHAGEAL ECHOCARDIOGRAM (TEE);  Surgeon: Wendall Stade, MD;  Location[facility] ENDOSCOPY;  Service: Cardiovascular;  Laterality: N/A;    Current Outpatient Prescriptions  Medication Sig Dispense Refill  . ALPRAZolam (XANAX) 1 MG tablet Take 1 mg by mouth 3 (three) times daily as needed for anxiety.     Marland Kitchen aspirin EC 325 MG tablet Take 1 tablet (325 mg total) by mouth daily. 30 tablet 3  . atorvastatin (LIPITOR) 20 MG tablet Take 1 tablet (20 mg total) by mouth daily. 30 tablet 3  . cyclobenzaprine (FLEXERIL) 10 MG  tablet Take 1 tablet (10 mg total) by mouth 3 (three) times daily as needed for muscle spasms. 30 tablet 0  . diphenhydrAMINE (BENADRYL) 25 MG tablet Take 25 mg by mouth at bedtime as needed.    . iron polysaccharides (NIFEREX) 150 MG capsule Take 1 capsule (150 mg total) by mouth 2 (two) times daily. 60 capsule 6  . naproxen (NAPROSYN) 500 MG tablet Take 1 tablet (500 mg total) by mouth 2 (two) times daily with a meal. 30 tablet 0  . omeprazole (PRILOSEC) 20 MG capsule TAKE 1 CAPSULE (20 MG TOTAL) BY MOUTH DAILY. 90 capsule 4   No current facility-administered medications for this visit.    Allergies as of 09/04/2014  . (No Known Allergies)    Vitals: BP 124/75 mmHg  Pulse 85  Temp(Src) 97.7 F (36.5 C)  Ht 5' 7.5" (1.715 m)  Wt 280 lb 8 oz (127.234 kg)  BMI 43.26 kg/m2  SpO2 98%  LMP 07/12/2014 Last Weight:  Wt Readings from Last 1 Encounters:  09/04/14 280 lb 8 oz (127.234 kg)   Last Height:   Ht Readings from Last 1 Encounters:  09/04/14 5' 7.5" (1.715 m)    Speech:  Speech is normal; fluent and spontaneous with normal comprehension.  Cognition:  The patient is oriented to person, place, and time;   recent and remote memory intact;   language fluent;   normal attention, concentration,   fund of knowledge Cranial Nerves:  The pupils are equal, round, and reactive to light. The fundi are normal and spontaneous venous pulsations are present. Visual fields are full to finger confrontation. Extraocular movements are intact. Trigeminal sensation is intact and the muscles of mastication are normal. The face is symmetric. The palate elevates in the midline. Hearing intact. Voice is normal. Shoulder shrug is normal. The tongue has normal motion without fasciculations.  Gait:  Normal native gait  Motor Observation:  No asymmetry, no atrophy, and no involuntary movements noted. Tone:  Normal muscle tone.   Posture:  Posture is normal. normal  erect   Strength:  Strength is V/V in the upper and lower limbs.   Assessment/Plan: Ms. Angel [name] is a 24 y.o. female with history of migraine headaches, obesity, anemia and family history of coagulopathy, with left-sided numbness. 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 Suggest weight loss and good diet, lipitor  daily b12 low normal, suggest daily vitamin  Continue aspirin daily  Obesity: recommend weight loss.   Naomie Dean, MD  [facility] [address] [place], [place] [address]  Phone [phone] Fax [phone]  A total of 15 minutes was spent face-to-face with this patient. Over half this time was spent on counseling patient on the  diagnosis and different diagnostic and therapeutic options available.

## 2014-09-04 NOTE — Patient Instructions (Signed)
Overall you are doing fairly well but I do want to suggest a few things today:   Remember to drink plenty of fluid, eat healthy meals and do not skip any meals. Try to eat protein with a every meal and eat a healthy snack such as fruit or nuts in between meals. Try to keep a regular sleep-wake schedule and try to exercise daily, particularly in the form of walking, 20-30 minutes a day, if you can.   As far as your medications are concerned, I would like to suggest: continue ASA daily  As far as diagnostic testing: MRI of the brain and an MRA of the head in 5 and 1/2 months  I would like to see you back in 6 months, sooner if we need to. Please call us with any interim questions, concerns, problems, updates or refill requests.   Please also call us for any test results so we can go over those with you on the phone.  My clinical assistant and will answer any of your questions and relay your messages to me and also relay most of my messages to you.   Our phone number is [phone]. We also have an after hours call service for urgent matters and there is a physician on-call for urgent questions. For any emergencies you know to call 911 or go to the nearest emergency room

## 2014-09-17 ENCOUNTER — Telehealth: Payer: Medicaid Other | Admitting: Family

## 2014-09-17 DIAGNOSIS — J309 Allergic rhinitis, unspecified: Secondary | ICD-10-CM

## 2014-09-17 MED ORDER — FLUTICASONE FUROATE 27.5 MCG/SPRAY NA SUSP: 2.0000 | Freq: Every day | NASAL | Status: DC

## 2014-09-17 NOTE — Progress Notes (Signed)
We are sorry that you are not feeling well.  Here is how we plan to help!  Based on what you have shared with me it looks like you may have allergic rhinitis (allergies causing nasal congestion and drainage). There are other possibilities, but not many that would persist for greater than 2 months with clear drainage and worsening this time of year with pollen levels increasing.  The preferred treatment would be over-the-counter Claritin 10mg  daily or Zyrtec 10mg  daily. You may use an oral decongestant such as Mucinex D or if you have glaucoma or high blood pressure use plain Mucinex.  Saline nasal spray help and can safely be used as often as needed for congestion, I have prescribed fluticasone nasal spray. Spray two sprays in each nostril twice a day to help reduce your symptoms.    Please avoid other decongestants and other nasal sprays at this time and especially avoid using Afrin due to the high risk of rebound nasal congestion. If you are using Afrin now, please stop immediately as it can cause your congestion to worsen severely.   If symptoms do not improve within a few days with these treatments, please be seen face-to-face.  Home Care:  Only take medications as instructed by your medical team.  Do not take these medications with alcohol.  A steam or ultrasonic humidifier can help congestion.  You can place a towel over your head and breathe in the steam from hot water coming from a faucet.  Cover your mouth when you cough or sneeze.  Always remember to wash your hands.  Get Help Right Away If:  You develop worsening fever or sinus pain.  You develop a severe head ache or visual changes.  Your symptoms persist after you have completed your treatment plan.  Make sure you  Understand these instructions.  Will watch your condition.  Will get help right away if you are not doing well or get worse.  Your e-visit answers were reviewed by a board certified advanced clinical  practitioner to complete your personal care plan.  Depending on the condition, your plan could have included both over the counter or prescription medications.  If there is a problem please reply  once you have received a response from your provider.  Your safety is important to us.  If you have drug allergies check your prescription carefully.    You can use MyChart to ask questions about today's visit, request a non-urgent call back, or ask for a work or school excuse.  You will get an e-mail in the next two days asking about your experience.  I hope that your e-visit has been valuable and will speed your recovery. Thank you for using e-visits.

## 2014-09-20 ENCOUNTER — Encounter: Payer: Self-pay | Admitting: Neurology

## 2014-09-21 ENCOUNTER — Telehealth: Payer: Self-pay | Admitting: Neurology

## 2014-09-21 ENCOUNTER — Other Ambulatory Visit: Payer: Self-pay | Admitting: Neurology

## 2014-09-21 MED ORDER — AMOXICILLIN 500 MG PO TABS: 500.0000 mg | ORAL_TABLET | Freq: Two times a day (BID) | ORAL | Status: DC

## 2014-09-21 NOTE — Telephone Encounter (Signed)
Called patient tonight at 7:30pm. She sent me an email and asked me to call her after 5pm. No answer, left message.

## 2014-09-25 NOTE — Telephone Encounter (Signed)
Spoke with patient, and emailed. Thank you. She is doing well, had a slight headache, resolved.

## 2014-09-30 ENCOUNTER — Encounter: Payer: Self-pay | Admitting: Neurology

## 2014-10-11 ENCOUNTER — Telehealth: Payer: Medicaid Other | Admitting: Family

## 2014-10-11 DIAGNOSIS — J309 Allergic rhinitis, unspecified: Secondary | ICD-10-CM

## 2014-10-11 DIAGNOSIS — K219 Gastro-esophageal reflux disease without esophagitis: Secondary | ICD-10-CM

## 2014-10-11 MED ORDER — OMEPRAZOLE 20 MG PO CPDR: DELAYED_RELEASE_CAPSULE | ORAL | Status: DC

## 2014-10-11 NOTE — Progress Notes (Signed)
We are sorry that you are not feeling well.  Here is how we plan to help!  Based on what you shared with me it looks like you most likely have Gastroesophageal Reflux Disease (GERD)  Gastroesophageal reflux disease (GERD) happens when acid from your stomach flows up into the esophagus.  When acid comes in contact with the esophagus, the acid causes sorenss (inflammation) in the esophagus.  Over time, GERD may create small holes (ulcers) in the lining of the esophagus.  I have prescribed Omeprazole, a Protein Pump inhibitor, 20 mg daily until you follow up with a provider.  Your symptoms should improve in the next day or two.  You can use antacids as needed until symptoms resolve.  Call us if your heartburn worsens, you have trouble swallowing, weight loss, spitting up blood or recurrent vomiting.  Home Care:  May include lifestyle changes such as weight loss, quitting smoking and alcohol consumption  Avoid foods and drinks that make your symptoms worse, such as:  Caffeine or alcoholic drinks  Chocolate  Peppermint or mint flavorings  Garlic and onions  Spicy foods  Citrus fruits, such as oranges, lemons, or limes  Tomato-based foods such as sauce, chili, salsa and pizza  Fried and fatty foods  Avoid lying down for 3 hours prior to your bedtime or prior to taking a nap  Eat small, frequent meals instead of a large meals  Wear loose-fitting clothing.  Do not wear anything tight around your waist that causes pressure on your stomach.  Raise the head of your bed 6 to 8 inches with wood blocks to help you sleep.  Extra pillows will not help.  Seek Help Right Away If:  You have pain in your arms, neck, jaw, teeth or back  Your pain increases or changes in intensity or duration  You develop nausea, vomiting or sweating (diaphoresis)  You develop shortness of breath or you faint  Your vomit is green, yellow, black or looks like coffee grounds or blood  Your stool is red,  bloody or black  These symptoms could be signs of other problems, such as heart disease, gastric bleeding or esophageal bleeding.  Make sure you :  Understand these instructions.  Will watch your condition.  Will get help right away if you are not doing well or get worse.  Your e-visit answers were reviewed by a board certified advanced clinical practitioner to complete your personal care plan.  Depending on the condition, your plan could have included both over the counter or prescription medications.  If there is a problem please reply  once you have received a response from your provider.  Your safety is important to us.  If you have drug allergies check your prescription carefully.    You can use MyChart to ask questions about today's visit, request a non-urgent call back, or ask for a work or school excuse.  You will get an e-mail in the next two days asking about your experience.  I hope that your e-visit has been valuable and will speed your recovery. Thank you for using e-visits.

## 2014-10-11 NOTE — Progress Notes (Signed)
We are sorry that you are not feeling well.  Here is how we plan to help!  Based on what you have shared with me it looks like you have sinusitis.  Sinusitis is inflammation and infection in the sinus cavities of the head.  Based on your presentation I believe you most likely have Acute Viral Sinusitis. This is an infection most likely caused by a virus.  There is not specific treatment for viral sinusitis other than to help you with the symptoms until the infection runs it's course.  You may use an oral decongestant such as Mucinex D or if you have glaucoma or high blood pressure use plain Mucinex.  Saline nasal spray help and can safely be used as often as needed for congestion, I have prescribed fluticason nasal spray. Spray two sprays in each nostril twice a day to help reduce your symptoms.  Some authorities believe that zinc sprays or the use of Echinacea may shorten the course of your symptoms.   I believe you would benefit from Zyrtec OTC daily and to continue your nasal spray.  You need to keep all follow up appointments with neurologists.   Sinus infections are not as easily transmitted as other respiratory infection, however we still recommend that you avoid close contact with loved ones, especially the very young and elderly.  Remember to wash your hands thoroughly throughout the day as this is the number one way to prevent the spread of infection!  Home Care:  Only take medications as instructed by your medical team.  Complete the entire course of an antibiotic.  Do not take these medications with alcohol.  A steam or ultrasonic humidifier can help congestion.  You can place a towel over your head and breathe in the steam from hot water coming from a faucet.  Avoid close contacts especially the very young and the elderly.  Cover your mouth when you cough or sneeze.  Always remember to wash your hands.  Get Help Right Away If:  You develop worsening fever or sinus pain.  You  develop a severe head ache or visual changes.  Your symptoms persist after you have completed your treatment plan.  Make sure you  Understand these instructions.  Will watch your condition.  Will get help right away if you are not doing well or get worse.  Your e-visit answers were reviewed by a board certified advanced clinical practitioner to complete your personal care plan.  Depending on the condition, your plan could have included both over the counter or prescription medications.  If there is a problem please reply  once you have received a response from your provider.  Your safety is important to us.  If you have drug allergies check your prescription carefully.    You can use MyChart to ask questions about today's visit, request a non-urgent call back, or ask for a work or school excuse.  You will get an e-mail in the next two days asking about your experience.  I hope that your e-visit has been valuable and will speed your recovery. Thank you for using e-visits.

## 2014-10-24 ENCOUNTER — Encounter: Payer: Self-pay | Admitting: Neurology

## 2014-10-27 ENCOUNTER — Telehealth: Payer: Medicaid Other | Admitting: Nurse Practitioner

## 2014-10-27 DIAGNOSIS — K219 Gastro-esophageal reflux disease without esophagitis: Secondary | ICD-10-CM

## 2014-10-27 MED ORDER — OMEPRAZOLE 40 MG PO CPDR: 40.0000 mg | DELAYED_RELEASE_CAPSULE | Freq: Every day | ORAL | Status: DC

## 2014-10-27 NOTE — Progress Notes (Signed)
We are sorry that you are not feeling well.  Here is how we plan to help!  Based on what you shared with me it looks like you most likely have Gastroesophageal Reflux Disease (GERD)  Gastroesophageal reflux disease (GERD) happens when acid from your stomach flows up into the esophagus.  When acid comes in contact with the esophagus, the acid causes sorenss (inflammation) in the esophagus.  Over time, GERD may create small holes (ulcers) in the lining of the esophagus.  I have prescribed Omeprazole, a Protein Pump inhibitor, 20 mg daily until you follow up with a provider.  Your symptoms should improve in the next day or two.  You can use antacids as needed until symptoms resolve.  Call us if your heartburn worsens, you have trouble swallowing, weight loss, spitting up blood or recurrent vomiting.  Home Care:  May include lifestyle changes such as weight loss, quitting smoking and alcohol consumption  Avoid foods and drinks that make your symptoms worse, such as:  Caffeine or alcoholic drinks  Chocolate  Peppermint or mint flavorings  Garlic and onions  Spicy foods  Citrus fruits, such as oranges, lemons, or limes  Tomato-based foods such as sauce, chili, salsa and pizza  Fried and fatty foods  Avoid lying down for 3 hours prior to your bedtime or prior to taking a nap  Eat small, frequent meals instead of a large meals  Wear loose-fitting clothing.  Do not wear anything tight around your waist that causes pressure on your stomach.  Raise the head of your bed 6 to 8 inches with wood blocks to help you sleep.  Extra pillows will not help.  Seek Help Right Away If:  You have pain in your arms, neck, jaw, teeth or back  Your pain increases or changes in intensity or duration  You develop nausea, vomiting or sweating (diaphoresis)  You develop shortness of breath or you faint  Your vomit is green, yellow, black or looks like coffee grounds or blood  Your stool is red,  bloody or black  These symptoms could be signs of other problems, such as heart disease, gastric bleeding or esophageal bleeding.  Make sure you :  Understand these instructions.  Will watch your condition.  Will get help right away if you are not doing well or get worse.  Your e-visit answers were reviewed by a board certified advanced clinical practitioner to complete your personal care plan.  Depending on the condition, your plan could have included both over the counter or prescription medications.  If there is a problem please reply  once you have received a response from your provider.  Your safety is important to us.  If you have drug allergies check your prescription carefully.    You can use MyChart to ask questions about today's visit, request a non-urgent call back, or ask for a work or school excuse.  You will get an e-mail in the next two days asking about your experience.  I hope that your e-visit has been valuable and will speed your recovery. Thank you for using e-visits.    

## 2014-11-03 ENCOUNTER — Other Ambulatory Visit: Payer: Self-pay | Admitting: Neurology

## 2014-11-06 ENCOUNTER — Telehealth: Payer: Self-pay

## 2014-11-06 NOTE — Telephone Encounter (Signed)
Thank you :)

## 2014-11-06 NOTE — Telephone Encounter (Signed)
Absolutely not. I called and left her a message to call me back. If she should call, let her know she has refilled this medication too often and she just got one from a different provider as well. Why is the pharmacy refilling before 30 days? Can you please also call Dr. Cleda Daub office and inform them of this situation that she is getting this prescription from multiple providers. Thanks for catching this [name].

## 2014-11-06 NOTE — Telephone Encounter (Signed)
[facility] indicated the patient is requesting a refill on Xanax 1mg .  A Rx for #30 + 4 refills was written on 04/11 (directions of 1 tid prn).  I called them and they stated the patient filled this Rx on 04/14, 04/22, 05/02, 05/20 and 06/05.  [facility].  [facility]  Spoke with Liborio Nixon who stated the patient filled a Rx there for Xanax 1mg  #90 on 05/26 (written on 05/23) from Dr Jeanella Anton.  Would you like to refill this med?  Please advise.  Thank you.

## 2014-11-06 NOTE — Telephone Encounter (Signed)
I called [facility].  Spoke with Darel Hong.  She provided me with the phone number for Dr Reece's office, [phone].  I called them.  [facility]).  Spoke with [place].  Relayed info we were provided by the pharmacy.  She will let Dr Jeanella Anton know, and they will call us back if anything further is needed.  I called [facility] who said the reason they refilled the Rx so frequently is because the sig was written one tid prn, #30, so "technically" the Rx is a 10 day supply.  Indicate ins will usually pay for refills a few days early.

## 2014-11-13 ENCOUNTER — Telehealth: Payer: Medicaid Other | Admitting: Family

## 2014-11-13 ENCOUNTER — Encounter: Payer: Self-pay | Admitting: Neurology

## 2014-11-13 DIAGNOSIS — B3731 Acute candidiasis of vulva and vagina: Secondary | ICD-10-CM

## 2014-11-13 DIAGNOSIS — B373 Candidiasis of vulva and vagina: Secondary | ICD-10-CM

## 2014-11-13 MED ORDER — FLUCONAZOLE 150 MG PO TABS
150.0000 mg | ORAL_TABLET | Freq: Once | ORAL | Status: DC
Start: 2014-11-13 — End: 2015-01-07

## 2014-11-13 NOTE — Progress Notes (Signed)
We are sorry that you are not feeling well. Here is how we plan to help! Based on what you shared with me it looks like you: May have a yeast vaginosis  Vaginosis is an inflammation of the vagina that can result in discharge, itching and pain. The cause is usually a change in the normal balance of vaginal bacteria or an infection. Vaginosis can also result from reduced estrogen levels after menopause.  The most common causes of vaginosis are:   Bacterial vaginosis which results from an overgrowth of one on several organisms that are normally present in your vagina.   Yeast infections which are caused by a naturally occurring fungus called candida.   Vaginal atrophy (atrophic vaginosis) which results from the thinning of the vagina from reduced estrogen levels after menopause.   Trichomoniasis which is caused by a parasite and is commonly transmitted by sexual intercourse.  Factors that increase your risk of developing vaginosis include: . Medications, such as antibiotics and steroids . Uncontrolled diabetes . Use of hygiene products such as bubble bath, vaginal spray or vaginal deodorant . Douching . Wearing damp or tight-fitting clothing . Using an intrauterine device (IUD) for birth control . Hormonal changes, such as those associated with pregnancy, birth control pills or menopause . Sexual activity . Having a sexually transmitted infection  Your treatment plan is A single Diflucan (fluconazole) 150mg tablet once.  I have electronically sent this prescription into the pharmacy that you have chosen.  Be sure to take all of the medication as directed. Stop taking any medication if you develop a rash, tongue swelling or shortness of breath. Mothers who are breast feeding should consider pumping and discarding their breast milk while on these antibiotics. However, there is no consensus that infant exposure at these doses would be harmful.  Remember that medication creams can weaken latex  condoms. .   HOME CARE:  Good hygiene may prevent some types of vaginosis from recurring and may relieve some symptoms:  . Avoid baths, hot tubs and whirlpool spas. Rinse soap from your outer genital area after a shower, and dry the area well to prevent irritation. Don't use scented or harsh soaps, such as those with deodorant or antibacterial action. . Avoid irritants. These include scented tampons and pads. . Wipe from front to back after using the toilet. Doing so avoids spreading fecal bacteria to your vagina.  Other things that may help prevent vaginosis include:  . Don't douche. Your vagina doesn't require cleansing other than normal bathing. Repetitive douching disrupts the normal organisms that reside in the vagina and can actually increase your risk of vaginal infection. Douching won't clear up a vaginal infection. . Use a latex condom. Both female and female latex condoms may help you avoid infections spread by sexual contact. . Wear cotton underwear. Also wear pantyhose with a cotton crotch. If you feel comfortable without it, skip wearing underwear to bed. Yeast thrives in moist environments Your symptoms should improve in the next day or two.  GET HELP RIGHT AWAY IF:  . You have pain in your lower abdomen ( pelvic area or over your ovaries) . You develop nausea or vomiting . You develop a fever . Your discharge changes or worsens . You have persistent pain with intercourse . You develop shortness of breath, a rapid pulse, or you faint.  These symptoms could be signs of problems or infections that need to be evaluated by a medical provider now.  MAKE SURE YOU      Understand these instructions.  Will watch your condition.  Will get help right away if you are not doing well or get worse.   Your e-visit answers were reviewed by a board certified advanced clinical practitioner to complete your personal care plan. Depending upon the condition, your plan could have included  both over the counter or prescription medications. Please review your pharmacy choice to make sure that you have choses a pharmacy that is open for you to pick up any needed prescription, Your safety is important to us. If you have drug allergies check your prescription carefully.  You can use MyChart to ask questions about today's visit, request a non-urgent call back, or ask for a work or school excuse. You will get a MyChart message within the next two days asking about your experience. I hope that your e-visit has been valuable and will speed your recovery.  

## 2014-11-14 ENCOUNTER — Telehealth: Payer: Self-pay | Admitting: Neurology

## 2014-11-14 ENCOUNTER — Telehealth: Payer: Medicaid Other | Admitting: Nurse Practitioner

## 2014-11-14 DIAGNOSIS — H538 Other visual disturbances: Secondary | ICD-10-CM

## 2014-11-14 DIAGNOSIS — Z8673 Personal history of transient ischemic attack (TIA), and cerebral infarction without residual deficits: Secondary | ICD-10-CM

## 2014-11-14 NOTE — Telephone Encounter (Signed)
I spoke with Angel [name]. I informed her that we were called by the pharmacy for a refill on Xanax. The pharmacy said she filled our Rx on 04/14, 04/22, 05/02, 05/20 and 06/05.  It appears she also refilled a prescription for Xanax 1mg  #90 on 05/26 (written on 05/23) from Dr Jeanella Anton at a different pharmacy.   I let patient patient know it is not acceptable for her to getting multiple prescriptions for controlled substances from different doctors. This is too much xanax. Xanax can cause respiratory distress, dependency and abuse, seizures, hypotension and other serious side effects. She claimed she is not taking it. I advised her to continue abstinence and we will not prescribe it for her again.  [name] or [name] - One of you called Dr. Cleda Daub office to inform them about this. Did you document who you spoke with?  thanks.

## 2014-11-14 NOTE — Telephone Encounter (Signed)
Dr. Lucia Gaskins, [name] spoke with [facility] from Dr. Cleda Daub office on 11/06/14. You can see the phone note in there, thank you!

## 2014-11-14 NOTE — Telephone Encounter (Signed)
Thank you :)

## 2014-11-14 NOTE — Telephone Encounter (Signed)
Hi Dr Lucia Gaskins,  I did call Dr Reece's office on 06/13 and spoke with [facility].  (This is documented in encounter from 06/13)  Please let me know if you need me to do anything else!  Thank you!

## 2014-11-14 NOTE — Progress Notes (Signed)
Based on what you shared with me it looks like you have a serious condition that should be evaluated in a face to face office visit.  If you are having a true medical emergency please call 911.  If you need an urgent face to face visit, [facility] has four urgent care centers for your convenience.  . [facility]  [phone] Get Driving Directions Find a Provider at this Location  [address] [place], [place] [address] . 8 am to 8 pm Monday-Friday . 9 am to 7 pm Saturday-Sunday  . [facility]  [phone] Get Driving Directions Find a Provider at this Location  [address] [place], [place] [address] . 8 am to 8 pm Monday-Friday . 9 am to 6 pm Saturday . 11 am to 6 pm Sunday   .  Urgent [facility] at [facility] [place]  [phone] Get Driving Directions  [address] [place], [place] [address] . 8 am to 8 pm Monday-Friday . 9 am to 4 pm Saturday-Sunday   . [facility] (a walk in primary care provider)  [phone]  Get Driving Directions Find a Provider at this Location  [address] [place], [place] [address] . 8 am to 8:30 pm Monday-Thursday . 8 am to 6 pm Friday . 8 am to 4 pm Saturday-Sunday   Your e-visit answers were reviewed by a board certified advanced clinical practitioner to complete your personal care plan.  Depending on the condition, your plan could have included both over the counter or prescription medications.  You will get an e-mail in the next two days asking about your experience.  I hope that your e-visit has been valuable and will speed your recovery . Thank you for choosing an e-visit.    

## 2014-11-15 ENCOUNTER — Telehealth: Payer: Self-pay | Admitting: *Deleted

## 2014-11-15 ENCOUNTER — Telehealth: Payer: Self-pay | Admitting: Neurology

## 2014-11-15 NOTE — Telephone Encounter (Signed)
Called pt back and she stated she had an e-visit with a doctor yesterday and they stated she needed to have an MRI done since she is still having a sinus infection for the past 6 months and a headache for this long. She was told she needed to go immediately for imaging. I told pt that Dr. Lucia Gaskins is out of the office until tomorrow and I will give her the message and the doctor who wants her to have imaging stat needs to place the order. She stated the doctor told her to go to the ER to having imaging done. I told her I would call her back if Dr. Lucia Gaskins would like her to do anything else. Pt verbalized understanding.

## 2014-11-15 NOTE — Telephone Encounter (Signed)
Error

## 2014-11-15 NOTE — Telephone Encounter (Signed)
Patient is calling to see if she can get an MRI scheduled because of headaches she has been having. Please call and discuss. The patient is at work so please leave a voice message.Thank you.

## 2014-11-16 ENCOUNTER — Telehealth: Payer: Medicaid Other | Admitting: Physician Assistant

## 2014-11-16 DIAGNOSIS — J329 Chronic sinusitis, unspecified: Secondary | ICD-10-CM

## 2014-11-16 NOTE — Progress Notes (Signed)
Based on what you shared with me it looks like you have a condition that should be evaluated in a face to face office visit.  Giving that your sinus symptoms have been present for 4 months or more, you need a good physical examination and potentially some imaging of your sinuses to further assess the cause of your chronic symptoms. Sometimes there is chronic sinusitis present that has its own particular treatment courses.  Other times symptoms are stemming from allergic inflammation or even from a nasal polyp(s).  Please set up an appointment with a health care provider for evaluation.  If you are having a true medical emergency please call 911.  If you need an urgent face to face visit, [facility] has four urgent care centers for your convenience.  [facility]  [phone] Get Driving Directions Find a Provider at this Location  [address] [place], [place] [address] . 8 am to 8 pm Monday-Friday . 9 am to 7 pm Saturday-Sunday  . [facility]  [phone] Get Driving Directions Find a Provider at this Location  [address] [place], [place] [address] . 8 am to 8 pm Monday-Friday . 9 am to 6 pm Saturday . 11 am to 6 pm Sunday   . [facility] at [facility]  [phone] Get Driving Directions  [address] [place], [place] [address] . 8 am to 8 pm Monday-Friday . 9 am to 4 pm Saturday-Sunday   . [facility] (a walk in primary care provider)  [phone]  Get Driving Directions Find a Provider at this Location  [address] [place], [place] [address] . 8 am to 8:30 pm Monday-Thursday . 8 am to 6 pm Friday . 8 am to 4 pm Saturday-Sunday   Your e-visit answers were reviewed by a board certified advanced clinical practitioner to complete your personal care plan.  Depending on the condition, your plan could have included both over the counter or  prescription medications.  You will get an e-mail in the next two days asking about your experience.  I hope that your e-visit has been valuable and will speed your recovery . Thank you for choosing an e-visit.

## 2014-11-16 NOTE — Telephone Encounter (Signed)
Would you tell patient to go to the ED please. If a doctor told her to go to the ED, that is what she should do. She can be evaluated there. thanks

## 2014-11-17 ENCOUNTER — Encounter (HOSPITAL_COMMUNITY): Payer: Self-pay | Admitting: Emergency Medicine

## 2014-11-17 ENCOUNTER — Emergency Department (HOSPITAL_COMMUNITY)
Admission: EM | Admit: 2014-11-17 | Discharge: 2014-11-17 | Disposition: A | Discharge status: Home / Self Care | Payer: Medicaid Other | Attending: Emergency Medicine | Admitting: Emergency Medicine

## 2014-11-17 DIAGNOSIS — F419 Anxiety disorder, unspecified: Secondary | ICD-10-CM | POA: Insufficient documentation

## 2014-11-17 DIAGNOSIS — Z792 Long term (current) use of antibiotics: Secondary | ICD-10-CM | POA: Diagnosis not present

## 2014-11-17 DIAGNOSIS — R51 Headache: Secondary | ICD-10-CM | POA: Diagnosis present

## 2014-11-17 DIAGNOSIS — Z8673 Personal history of transient ischemic attack (TIA), and cerebral infarction without residual deficits: Secondary | ICD-10-CM | POA: Insufficient documentation

## 2014-11-17 DIAGNOSIS — Z7982 Long term (current) use of aspirin: Secondary | ICD-10-CM | POA: Diagnosis not present

## 2014-11-17 DIAGNOSIS — Z79899 Other long term (current) drug therapy: Secondary | ICD-10-CM | POA: Diagnosis not present

## 2014-11-17 DIAGNOSIS — Z862 Personal history of diseases of the blood and blood-forming organs and certain disorders involving the immune mechanism: Secondary | ICD-10-CM | POA: Diagnosis not present

## 2014-11-17 DIAGNOSIS — E669 Obesity, unspecified: Secondary | ICD-10-CM | POA: Diagnosis not present

## 2014-11-17 DIAGNOSIS — R519 Headache, unspecified: Secondary | ICD-10-CM

## 2014-11-17 DIAGNOSIS — Z87891 Personal history of nicotine dependence: Secondary | ICD-10-CM | POA: Insufficient documentation

## 2014-11-17 MED ORDER — DEXAMETHASONE 4 MG PO TABS
12.0000 mg | ORAL_TABLET | Freq: Once | ORAL | Status: AC
  Administered 2014-11-17: 12 mg via ORAL
  Filled 2014-11-17: qty 3

## 2014-11-17 MED ORDER — LORATADINE 10 MG PO TABS: 10.0000 mg | ORAL_TABLET | Freq: Every day | ORAL | Status: DC

## 2014-11-17 MED ORDER — KETOROLAC TROMETHAMINE 30 MG/ML IJ SOLN
30.0000 mg | Freq: Once | INTRAMUSCULAR | Status: AC
  Administered 2014-11-17: 30 mg via INTRAVENOUS
  Filled 2014-11-17: qty 1

## 2014-11-17 NOTE — ED Notes (Signed)
Pt voicing concern that a head CT is not being done. Pt is concerned due to previous stroke in December. MD Juleen China made aware.

## 2014-11-17 NOTE — ED Notes (Signed)
Patient coming from home with c/o of generalized headache ongoing with sinus infection x couple days.  Pain is 7/10.  Patient has history strokes in December of 2015.

## 2014-11-17 NOTE — ED Notes (Signed)
Dr. Kohut at bedside with patient and family.   

## 2014-11-17 NOTE — ED Notes (Signed)
Dr. Kohut at bedside 

## 2014-11-17 NOTE — Discharge Instructions (Signed)

## 2014-11-17 NOTE — ED Provider Notes (Signed)
CSN: [id number]     Arrival date & time 11/17/14  3748 History   First MD Initiated Contact with Patient 11/17/14 [phone]     Chief Complaint  Patient presents with  . Headache     (Consider location/radiation/quality/duration/timing/severity/associated sxs/prior Treatment) HPI   24 year old female with headache. Frontal region. Ongoing for the past couple months. Patient has a past history of stroke diagnosed in December 2015. No residual deficits. Her headache has worsened over the last 2 days she is presenting for evaluation today. For the past several days has been relatively constant. No appreciable exacerbating relieving factors. No fevers or chills. No neck or stiffness. No acute visual complaints.  Past Medical History  Diagnosis Date  . Anemia   . Headache   . Obesity   . Anxiety   . Stroke    Past Surgical History  Procedure Laterality Date  . None    . Tee without cardioversion N/A 05/17/2014    Procedure: TRANSESOPHAGEAL ECHOCARDIOGRAM (TEE);  Surgeon: Wendall Stade, MD;  Location[facility] ENDOSCOPY;  Service: Cardiovascular;  Laterality: N/A;   Family History  Problem Relation Age of Onset  . Cancer Paternal Uncle   . Diabetes Paternal Uncle   . Heart disease Maternal Grandmother   . Heart disease Maternal Grandfather   . Diabetes Paternal Grandmother    History  Substance Use Topics  . Smoking status: Former Games developer  . Smokeless tobacco: Never Used  . Alcohol Use: 0.0 oz/week    0 Standard drinks or equivalent per week     Comment: Occasionally.   OB History    No data available     Review of Systems  All systems reviewed and negative, other than as noted in HPI.   Allergies  Review of patient's allergies indicates no known allergies.  Home Medications   Prior to Admission medications   Medication Sig Start Date End Date Taking? Authorizing Provider  ALPRAZolam Prudy Feeler) 1 MG tablet Take 1 tablet (1 mg total) by mouth 3 (three) times daily as needed for  anxiety. 09/04/14   Anson Fret, MD  amoxicillin (AMOXIL) 500 MG tablet Take 1 tablet (500 mg total) by mouth 2 (two) times daily. 09/21/14   Anson Fret, MD  aspirin EC 325 MG tablet Take 1 tablet (325 mg total) by mouth daily. 09/04/14   Anson Fret, MD  atorvastatin (LIPITOR) 20 MG tablet Take 1 tablet (20 mg total) by mouth daily. 09/04/14   Anson Fret, MD  cyclobenzaprine (FLEXERIL) 10 MG tablet Take 1 tablet (10 mg total) by mouth 3 (three) times daily as needed for muscle spasms. 08/31/14   Waldon Merl, PA-C  diphenhydrAMINE (BENADRYL) 25 MG tablet Take 25 mg by mouth at bedtime as needed.    Historical Provider, MD  fluconazole (DIFLUCAN) 150 MG tablet Take 1 tablet (150 mg total) by mouth once. 11/13/14   Junie Spencer, FNP  fluticasone (VERAMYST) 27.5 MCG/SPRAY nasal spray Place 2 sprays into the nose daily. 09/17/14   Beau Fanny, FNP  iron polysaccharides (NIFEREX) 150 MG capsule Take 1 capsule (150 mg total) by mouth 2 (two) times daily. 08/01/14   Levert Feinstein, MD  loratadine (CLARITIN) 10 MG tablet Take 1 tablet (10 mg total) by mouth daily. 11/17/14   Raeford Razor, MD  naproxen (NAPROSYN) 500 MG tablet Take 1 tablet (500 mg total) by mouth 2 (two) times daily with a meal. 08/31/14   Waldon Merl, PA-C  omeprazole (  PRILOSEC) 40 MG capsule Take 1 capsule (40 mg total) by mouth daily. 10/27/14   Mary-Margaret Daphine Deutscher, FNP   BP 141/71 mmHg  Pulse 78  Temp(Src) 98.1 F (36.7 C) (Oral)  Resp 16  SpO2 97% Physical Exam  Constitutional: She is oriented to person, place, and time. She appears well-developed and well-nourished. No distress.  HENT:  Head: Normocephalic and atraumatic.  Eyes: Conjunctivae are normal. Right eye exhibits no discharge. Left eye exhibits no discharge.  Neck: Neck supple.  No nuchal rigidity  Cardiovascular: Normal rate, regular rhythm and normal heart sounds.  Exam reveals no gallop and no friction rub.   No murmur  heard. Pulmonary/Chest: Effort normal and breath sounds normal. No respiratory distress.  Abdominal: Soft. She exhibits no distension. There is no tenderness.  Musculoskeletal: She exhibits no edema or tenderness.  Neurological: She is alert and oriented to person, place, and time. No cranial nerve deficit. She exhibits normal muscle tone. Coordination normal.  Good finger to nose testing bilaterally. Gait is steady.  Skin: Skin is warm and dry.  Psychiatric: She has a normal mood and affect. Her behavior is normal. Thought content normal.  Nursing note and vitals reviewed.   ED Course  Procedures (including critical care time) Labs Review Labs Reviewed - No data to display  Imaging Review No results found.   EKG Interpretation None      MDM   Final diagnoses:  Frontal headache    24yF with frontal HA for several months. Chronic rhinosinusitis? Patient is concerned because prior history of stroke. Questions whether she needs imaging today. No indication for emergent imaging. She is nonfocal neurological examination. She has no neurological complaints aside from her headache itself.    Raeford Razor, MD 11/22/14 [phone]

## 2014-11-21 NOTE — Telephone Encounter (Signed)
That is fine  thanks

## 2014-11-28 ENCOUNTER — Telehealth: Payer: Self-pay | Admitting: Neurology

## 2014-11-28 ENCOUNTER — Encounter: Payer: Self-pay | Admitting: Neurology

## 2014-11-28 NOTE — Telephone Encounter (Signed)
Spoke with pt and she stated she went to ED for headache and they thought it was allergies so they gave her Claritin. She states "For the past 4 months they have given me antibiotics because they think I have a sinus infection. I am not seeing double, but I do get dizzy sometimes".  She has MRI scheduled for January 04, 2015 but would like to have MRI sooner if possible d/t her current symptoms. Advised her Dr. Lucia GaskinsAhern is out of the office until tomorrow and I will call her back by tomorrow. Pt verbalized understanding.

## 2014-11-28 NOTE — Telephone Encounter (Signed)
Patient called stating that she had a severe migraine and was very nauseas and went to [facility] and they thought it was allergies and gave her a Claritin. Patient would like Dr. Lucia GaskinsAhern to order a MRI. Please call and advise. Patient can be reached @  [phone]

## 2014-11-29 ENCOUNTER — Other Ambulatory Visit: Payer: Self-pay | Admitting: Neurology

## 2014-11-29 DIAGNOSIS — I639 Cerebral infarction, unspecified: Secondary | ICD-10-CM

## 2014-11-29 DIAGNOSIS — H539 Unspecified visual disturbance: Secondary | ICD-10-CM

## 2014-11-29 DIAGNOSIS — D6859 Other primary thrombophilia: Secondary | ICD-10-CM

## 2014-11-29 NOTE — Telephone Encounter (Signed)
Please let patientknow I ordered a repet MRi of the brain. thanks

## 2014-11-29 NOTE — Telephone Encounter (Signed)
Spoke with Dois DavenportSandra from the phone room and she stated she transferred pt to Angel GrantAndrea Loye who is going to schedule pt for an earlier date for MRI.

## 2014-11-29 NOTE — Telephone Encounter (Signed)
Left VM for pt to talk about MRI. Told her we close at 5pm and open at 8am tomorrow morning. Gave [facility] phone number.

## 2014-12-06 ENCOUNTER — Other Ambulatory Visit: Payer: Self-pay | Admitting: Neurology

## 2014-12-08 ENCOUNTER — Telehealth: Payer: Self-pay | Admitting: Neurology

## 2014-12-08 DIAGNOSIS — I67841 Reversible cerebrovascular vasoconstriction syndrome: Secondary | ICD-10-CM

## 2014-12-08 NOTE — Telephone Encounter (Signed)
OK to send, can you place referral please?

## 2014-12-08 NOTE — Telephone Encounter (Signed)
OK to send or get from PCP?

## 2014-12-08 NOTE — Telephone Encounter (Signed)
I spoke to patient and she is aware that we have put in orders for this referral.

## 2014-12-08 NOTE — Telephone Encounter (Signed)
Pt needs a referral to [facility] [place], [place] . She has moved and will not be returning. May call pt at [phone].

## 2014-12-09 ENCOUNTER — Inpatient Hospital Stay: Admission: RE | Admit: 2014-12-09 | Payer: Medicaid Other | Source: Ambulatory Visit

## 2014-12-12 ENCOUNTER — Telehealth: Payer: Medicaid Other | Admitting: Nurse Practitioner

## 2014-12-12 ENCOUNTER — Telehealth: Payer: Self-pay | Admitting: Neurology

## 2014-12-12 ENCOUNTER — Encounter: Payer: Self-pay | Admitting: Neurology

## 2014-12-12 DIAGNOSIS — N760 Acute vaginitis: Principal | ICD-10-CM

## 2014-12-12 DIAGNOSIS — B9689 Other specified bacterial agents as the cause of diseases classified elsewhere: Secondary | ICD-10-CM

## 2014-12-12 MED ORDER — METRONIDAZOLE 500 MG PO TABS: 500.0000 mg | ORAL_TABLET | Freq: Two times a day (BID) | ORAL | Status: DC

## 2014-12-12 NOTE — Progress Notes (Signed)

## 2014-12-12 NOTE — Telephone Encounter (Signed)
Patient called requesting to touch base with Dr Lucia GaskinsAhern as she has moved to [place]. Please call and advise. She can be reached at [phone].

## 2014-12-13 ENCOUNTER — Other Ambulatory Visit: Payer: Self-pay | Admitting: *Deleted

## 2014-12-13 ENCOUNTER — Telehealth: Payer: Self-pay | Admitting: Neurology

## 2014-12-13 DIAGNOSIS — I67841 Reversible cerebrovascular vasoconstriction syndrome: Secondary | ICD-10-CM

## 2014-12-13 DIAGNOSIS — G43909 Migraine, unspecified, not intractable, without status migrainosus: Secondary | ICD-10-CM

## 2014-12-13 NOTE — Telephone Encounter (Signed)
Referral placed to [facility] per patient's request.

## 2014-12-13 NOTE — Telephone Encounter (Signed)
Left message for patient to call medical records.

## 2014-12-13 NOTE — Telephone Encounter (Signed)
Patient asked me to call her in email. I called, she wants to know the procedure for transfer of medical records. Can you let her know how to fill out the form etc etc? thanks

## 2014-12-21 ENCOUNTER — Telehealth: Payer: Self-pay | Admitting: Neurology

## 2014-12-21 NOTE — Telephone Encounter (Signed)
Spoke w/ pt about referral. Told her I will check into it and see what else they need from [place]. Told her to call back w/ further questions. She verbalized understanding.

## 2014-12-21 NOTE — Telephone Encounter (Signed)
Spoke w/ Renee from scheduling and she stated they have not received referral and it is not in the system. She transferred me to medical records and I spoke w/ Yvonna Alanis. She stated they have not received anything either. I informed her that we received a fax confirmation, and she told me "Even though you received a fax confirmation, sometimes we do not get it still. It is not just you, it has happened before". She asked for me to refax referral and to call 10 minutes later to make sure they received faxed referral.

## 2014-12-21 NOTE — Telephone Encounter (Signed)
Patient called stating she has contact [facility] and the office has not received referral. She said they only take referrals over the phone. Please call and advise. Patient can be reached at [phone].

## 2014-12-22 ENCOUNTER — Telehealth: Payer: Medicaid Other | Admitting: Nurse Practitioner

## 2014-12-22 DIAGNOSIS — N760 Acute vaginitis: Secondary | ICD-10-CM

## 2014-12-22 MED ORDER — FLUCONAZOLE 150 MG PO TABS: ORAL_TABLET | ORAL | Status: DC

## 2014-12-22 NOTE — Progress Notes (Signed)
We are sorry that you are not feeling well. Here is how we plan to help! Based on what you shared with me it looks like you: May have a yeast vaginosis  Vaginosis is an inflammation of the vagina that can result in discharge, itching and pain. The cause is usually a change in the normal balance of vaginal bacteria or an infection. Vaginosis can also result from reduced estrogen levels after menopause.  The most common causes of vaginosis are:   Bacterial vaginosis which results from an overgrowth of one on several organisms that are normally present in your vagina.   Yeast infections which are caused by a naturally occurring fungus called candida.   Vaginal atrophy (atrophic vaginosis) which results from the thinning of the vagina from reduced estrogen levels after menopause.   Trichomoniasis which is caused by a parasite and is commonly transmitted by sexual intercourse.  Factors that increase your risk of developing vaginosis include: . Medications, such as antibiotics and steroids . Uncontrolled diabetes . Use of hygiene products such as bubble bath, vaginal spray or vaginal deodorant . Douching . Wearing damp or tight-fitting clothing . Using an intrauterine device (IUD) for birth control . Hormonal changes, such as those associated with pregnancy, birth control pills or menopause . Sexual activity . Having a sexually transmitted infection  Your treatment plan is A single Diflucan (fluconazole) 150mg tablet once.  I have electronically sent this prescription into the pharmacy that you have chosen.  Be sure to take all of the medication as directed. Stop taking any medication if you develop a rash, tongue swelling or shortness of breath. Mothers who are breast feeding should consider pumping and discarding their breast milk while on these antibiotics. However, there is no consensus that infant exposure at these doses would be harmful.  Remember that medication creams can weaken latex  condoms. .   HOME CARE:  Good hygiene may prevent some types of vaginosis from recurring and may relieve some symptoms:  . Avoid baths, hot tubs and whirlpool spas. Rinse soap from your outer genital area after a shower, and dry the area well to prevent irritation. Don't use scented or harsh soaps, such as those with deodorant or antibacterial action. . Avoid irritants. These include scented tampons and pads. . Wipe from front to back after using the toilet. Doing so avoids spreading fecal bacteria to your vagina.  Other things that may help prevent vaginosis include:  . Don't douche. Your vagina doesn't require cleansing other than normal bathing. Repetitive douching disrupts the normal organisms that reside in the vagina and can actually increase your risk of vaginal infection. Douching won't clear up a vaginal infection. . Use a latex condom. Both female and female latex condoms may help you avoid infections spread by sexual contact. . Wear cotton underwear. Also wear pantyhose with a cotton crotch. If you feel comfortable without it, skip wearing underwear to bed. Yeast thrives in moist environments Your symptoms should improve in the next day or two.  GET HELP RIGHT AWAY IF:  . You have pain in your lower abdomen ( pelvic area or over your ovaries) . You develop nausea or vomiting . You develop a fever . Your discharge changes or worsens . You have persistent pain with intercourse . You develop shortness of breath, a rapid pulse, or you faint.  These symptoms could be signs of problems or infections that need to be evaluated by a medical provider now.  MAKE SURE YOU      Understand these instructions.  Will watch your condition.  Will get help right away if you are not doing well or get worse.   Your e-visit answers were reviewed by a board certified advanced clinical practitioner to complete your personal care plan. Depending upon the condition, your plan could have included  both over the counter or prescription medications. Please review your pharmacy choice to make sure that you have choses a pharmacy that is open for you to pick up any needed prescription, Your safety is important to us. If you have drug allergies check your prescription carefully.  You can use MyChart to ask questions about today's visit, request a non-urgent call back, or ask for a work or school excuse. You will get a MyChart message within the next two days asking about your experience. I hope that your e-visit has been valuable and will speed your recovery.  

## 2014-12-25 ENCOUNTER — Telehealth: Payer: Self-pay | Admitting: Neurology

## 2014-12-25 NOTE — Telephone Encounter (Signed)
Spoke w/ [facility] office and they informed me that d/t pt insurance, her PCP has to send referral over to them since they are a specialist office.

## 2014-12-25 NOTE — Telephone Encounter (Signed)
Pt called and states that [facility] will not take paper referrals, they need to talk with someone on the phone and schedule an appt. May call pt [phone]

## 2014-12-25 NOTE — Telephone Encounter (Signed)
Spoke w/ pt to let her know she needs to get referral from her PCP, Alwyn Pea due to the type of insurance she has. Told her to call back if she had further questions. She verbalized understanding.

## 2015-01-04 ENCOUNTER — Telehealth: Payer: Self-pay | Admitting: *Deleted

## 2015-01-04 ENCOUNTER — Ambulatory Visit: Payer: Medicaid Other | Admitting: Neurology

## 2015-01-04 NOTE — Telephone Encounter (Signed)
No showed appointment.

## 2015-01-05 ENCOUNTER — Encounter: Payer: Self-pay | Admitting: Neurology

## 2015-01-07 ENCOUNTER — Telehealth: Payer: Medicaid Other | Admitting: Physician Assistant

## 2015-01-07 DIAGNOSIS — N3090 Cystitis, unspecified without hematuria: Secondary | ICD-10-CM

## 2015-01-07 MED ORDER — NITROFURANTOIN MONOHYD MACRO 100 MG PO CAPS
100.0000 mg | ORAL_CAPSULE | Freq: Two times a day (BID) | ORAL | Status: DC
Start: 2015-01-07 — End: 2019-10-17

## 2015-01-07 NOTE — [facility] Notes (Signed)
We are sorry that you are not feeling well.  Here is how we plan to help!  Based on what you shared with me it looks like you most likely have a simple urinary tract infection giving smell of urine and some belly pain. Doubtful that a yeast infection would cause pain.  A UTI (Urinary Tract Infection) is a bacterial infection of the bladder.  Most cases of urinary tract infections are simple to treat but a key part of your care is to encourage you to drink plenty of fluids and watch your symptoms carefully.  I have prescribed MacroBid 100 mg twice a day for 5 days.  Your symptoms should gradually improve. Follow-up with your primary care for evaluation if the smell in your urine worsens, you develop worsening fever, back pain or pelvic pain or if your symptoms do not resolve after completing the antibiotic.  Urinary tract infections can be prevented by drinking plenty of water to keep your body hydrated.  Also be sure when you wipe, wipe from front to back and don't hold it in!  If possible, empty your bladder every 4 hours.  Your e-visit answers were reviewed by a board certified advanced clinical practitioner to complete your personal care plan.  Depending on the condition, your plan could have included both over the counter or prescription medications.  If there is a problem please reply  once you have received a response from your provider.  Your safety is important to [place].  If you have drug allergies check your prescription carefully.    You can use MyChart to ask questions about today's visit, request a non-urgent call back, or ask for a work or school excuse.  You will get an e-mail in the next two days asking about your experience.  I hope that your e-visit has been valuable and will speed your recovery. Thank you for using e-visits.

## 2015-01-24 ENCOUNTER — Other Ambulatory Visit: Payer: Self-pay | Admitting: Physician Assistant

## 2015-01-24 DIAGNOSIS — M546 Pain in thoracic spine: Secondary | ICD-10-CM

## 2015-02-02 ENCOUNTER — Other Ambulatory Visit: Payer: Medicaid Other

## 2015-02-06 NOTE — H&P (Signed)
 Neurology Attending History and Physical Note  [facility] [phone]  Physician Performing this Evaluation: Shawnna L. Jakie, MD, PhD Attending Provider: Genette Lynwood Aran, MD  Date of Admission: 02/06/2015 1632  Date and Time of Evaluation: 02/06/2015, 10:53 PM Medical Record Number:[id number]  Date of Birth: 10/07/1990  Payor: MEDICAID / Plan: MEDICAID [place] ACCESS / Product Type: Medicaid /   Asked to perform this initial neurological assessment to evaluate for TIA/CVA  Impression:  1. Hypovolemia, anxiety.  2. Patient  has a past medical history of PAST MEDICAL HISTORY OF. She also has no past medical history of Congestive heart failure, unspecified, Chronic airway obstruction, not elsewhere classified, Hypertension, Stroke, Sleep apnea, Dialysis patient, Arthritis, Asthma, or Diabetes mellitus.  Plan:  1. Admit neurology. 2. Telemetry. 3. MRI brain with and without contrast. 4. Orthostatics. 5. IVF NS after check orthostatics. 6. Check fasting lipids, baseline LFTs, A1C, vitamin b12, folate.  7. Continue home asa and statin.  8. Glucose control.  9. Needs to be started on anxiolytic.  Zoloft vs prozac vs buspirone.  Chief Complaint: had concerns including Dizziness.    History of Present Illness: Angel [name] is a 24yrs Female who presented  on 02/06/2015 1632. Patient here with fiance.  Was at orientation at [facility] today and had sharp pains in the back of her head.  Stood to go to the restroom and felt dizzy (vertiginous x 1 min) and then developed a sensation of weakness in her bilateral legs.  Denies numbness or tingling.  Was able to walk.  Lasted from 1130 am to 930 pm.  Is back to baseline now.    Was seen by neurology in [facility] for headaches starting 02-2015.  Started on topiramate  and relpax . 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  She was admitted  - had MRI, labs,  Echo(EF 55-60%), Angio (occluded right PCA, tapered left PCA, left opthalmic), LP (WBC 4,2).protein 15, glucose 68.   and sent out on asa and lipitor and taken off of OCP.  LDL 79. HDL 55.  A1C 6.2 Protein S and C were low.  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   Records reviewed from [place] in [place] everywhere.  At one point she has some peripheral vision loss from Dec to Feb, but she says this has resolved.  She denies every having right or left sided weakness or numbness.  She stopped topiramate  and relpax  as they were not working.  She has migraines only about once month and takes ketoralac 10 mg for them. Headache today was not typical migraine for her (which is more frontal), was a stabbing occipital pain which she last had ~4 months ago.  Aunt has headaches.  Denies triggers for headaches.  Does not exercise.  Drank very little water til recently, now drinking 2 large bottles a day.  Was drinking 3 cans of coke a day.  Bed 10 pm.  No problems with sleep.  Wakes rested.  Snores.  No known apnea.  Not sleeping in day.  She has a history of anxiety and was on paxil ? Dose and did not think it was working, so stopped.  PCP started xanax  1mg  tid prn and she had been taking bid but has been out for several weeks due to move. Has not been able to  establish with PCP/neurology in [place] due to insurance issues.   Moved here to attend [employer] and to work at [employer].   CT head today with right occipital encephalomalacia.  BP 130/63.  FS 80.  Past Medical History  Diagnosis Date  Stroke on MRI 04-2014. Migraine.  Past Surgical History  Procedure Laterality Date  . Sur - other: specify in comments      none     History  Substance Use Topics  . Smoking status: Never Smoker   . Smokeless tobacco: Never Used  . Alcohol Use: No   Drug Use: none.  Occupation: new Psychiatrist.  Orthoptist, nursing.  Engaged.  Fiance in room.  Has 25 year old daughter.  Using condoms for birth control. Home Phone: [phone] (home)    Family History  Problem Relation Age of Onset  Mom - blood clots in legs. Maternal grandmother - dm, hld. Maternal grandfather - stroke Maternal uncle - blood clot, MI.  Review of Systems: nausea this am briefly. No fever, sweats, chills, blurred vision, double vision, ear pain, eye pain, hearing loss, chest pain or heaviness, cough or sputum production, shortness of breath, swallowing difficulty, stomach pain, nausea or vomiting, diarrhea, constipation, bowel or bladder incontinence, burning urine, joint pain, stiffness or swelling, skin rash, depression, easy bruising or bleeding.   Blood pressure 115/60, pulse 71, temperature 36.3 C (97.3 F), resp. rate 17, height 1.727 m, weight 115.667 kg, SpO2 99 %.  Temp (72hrs), Avg:36.5 C (97.7 F), Min:36.3 C (97.3 F), Max:36.7 C (98.1 F)   General Physical Examination: The patient was overweight, in no acute distress. Head: Normocephalic, autraumatic.  Eyes: Conjunctiva, lids, pupils, and irises clear. Oral mucose is moist.  Neck: Supple.  No carotid bruits. Lungs: Clear. Cardiac: Regular rate and rhythm, no murmurs. Arms and legs are warm and well perfused. Skin (Restricted to face and distal upper and lower extremities): Unremarkable. Abdomen: Soft. Positive bowel sounds.  No clubbing, cyanosis or edema in the lower extremities.  Neurological Examination: Mental Status: Good mood, appropriate affect.  Alert and oriented and follows commands.  Memory, attention, and concentration are within normal limits. Good comprehension, naming, repetition, reading, and writing.  Good fund of knowledge. Cranial nerves II-Visual fields full. Pupils equal and reactive. Discs sharp. III, IV and VI: Extraocular movements intact. No nystagmus. V: Symmetric facial sensation to light touch and temperature.  VII: Face  symmetric. VIII: Hearing intact to finger rub bilaterally. IX, X and XII: Tongue, uvula and palate mid position. XI: Normal shoulder shrug.  Sternocleidomastoids are 5/5.  Motor Examination: Normal bulk and tone. There is no pronator drift. Full strength in the bilateral upper and lower extremities.  Coordination: Finger-to-nose within normal limits bilaterally.  Romberg is negative.  Reflexes: Symmetric with toes down going bilaterally.  Sensory Examination: Intact to light touch, temperature, vibration and proprioception bilaterally.  Gait: Able to toe stand, heel stand and tandem walk.  Base, gait and station are within normal limits. Good range of motion of her neck.  No muscle spasm.    Home Medications: Prior to Admission medications   Medication Sig Start Date End Date Taking? Authorizing Provider  ALPRAZolam  (XANAX ) 1 mg Oral Tablet Take 1 mg by mouth three times a day as needed for Anxiety.   Yes Reported, Patient  amoxicillin  (AMOXIL ) 500 mg Oral Capsule Take 500 mg by mouth twice a day. 02/05/15  Yes Reported, Patient  aspirin  (ECOTRIN) 325 mg Oral Tablet, Delayed Release (E.C.) Take  325 mg by mouth daily.   Yes Reported, Patient  atorvastatin  (LIPITOR) 20 mg Oral Tablet Take 20 mg by mouth every morning.   Yes Reported, Patient  diphenhydrAMINE  (BENADRYL ) 25 mg Oral Capsule Take 25 mg by mouth every 4 hours as needed for Itching.   Yes Reported, Patient  guaifenesin ER (MUCINEX) 600 mg Oral Tablet Sustained Release Take 1,200 mg by mouth twice a day as needed.   Yes Reported, Patient  omeprazole  (PRILOSEC) 40 mg Oral Capsule, Delayed Release(E.C.) Take 40 mg by mouth before breakfast.   Yes Reported, Patient  no longer taking xanax .  Inpatient Medications: No current facility-administered medications for this encounter.   Current Outpatient Prescriptions  Medication Sig Dispense Refill  . ALPRAZolam  (XANAX ) 1 mg Oral Tablet Take 1 mg by mouth three times a day as needed for  Anxiety.    . amoxicillin  (AMOXIL ) 500 mg Oral Capsule Take 500 mg by mouth twice a day.    . aspirin  (ECOTRIN) 325 mg Oral Tablet, Delayed Release (E.C.) Take 325 mg by mouth daily.    . atorvastatin  (LIPITOR) 20 mg Oral Tablet Take 20 mg by mouth every morning.    . diphenhydrAMINE  (BENADRYL ) 25 mg Oral Capsule Take 25 mg by mouth every 4 hours as needed for Itching.    SABRA guaifenesin ER (MUCINEX) 600 mg Oral Tablet Sustained Release Take 1,200 mg by mouth twice a day as needed.    . omeprazole  (PRILOSEC) 40 mg Oral Capsule, Delayed Release(E.C.) Take 40 mg by mouth before breakfast.      ALLERGIES: Nkda  Results of Laboratory and Other Testing: Recent Labs     02/06/15  1817  HGB  12.1  HCT  39.1  PLATELET  376  WBC  8.50  NEUTROPHIL  5.50  64  LYMPHOCYTE  2.60  31  MONOCYTE  0.40  5  EOSINOPHILS  0.00  0  BASOPHIL  0.00  1  SODIUM  142  POTASSIUM  3.7  CHLORIDE  107  BICARBONATE  25  BUN  8  CREATININE  0.65  GLUCOSE  80  CALCIUM   9.8  PT  10.9    Radiology: Results for orders placed or performed during the hospital encounter of 02/06/15 (from the past 72 hour(s))  CT HEAD W/O CONTRAST   Collection Time: 02/06/15  7:19 PM   Narrative   Clinical: 24 year old with protein C and S deficiency, complaining of  dizziness and headache. Has had CVAs, 05/2014. Recent sinus infection, on  antibiotics  Technique: Sequential 5 mm axial images were obtained from the skull base  through the vertex without IV contrast.  Comparison: No priors  Findings: The ventricles and cisterns are normal in size and configuration.  There is right occipital encephalomalacia, presumably from previous  infarction. The rest of brain exhibits normal attenuation and  configuration. No intracranial hemorrhage is identified. There is no  midline or downward shift. No mass lesions.  No skull lesions. No significant sinus opacification.    Impression   Impression:  1. 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 No definite acute ischemic findings and no hemorrhage or other acute  intracranial abnormalities. MRI exhibits superior sensitivity over CT for  early ischemia and other subtle intracranial lesions and may be indicated.  Correlate clinically     Reading Doctor: Ezzard Hacker Electronic Signature by: Ezzard Hacker    EKG: Results for orders placed or performed during the hospital encounter of 02/06/15 (from the past 24  hour(s))  EKG 12 LEAD   Collection Time: 02/06/15  5:43 PM  Test Value Low-High   EKG Text      INTERFACED DOCUMENTS - [facility] - BORDERLINE ECG - SINUS RHYTHM normal P axis, V-rate 60-99 BORDERLINE Q WAVES IN INFERIOR LEADS Qs add to 80 mS in II III aVF INFERIOR Q WAVES, PROBABLY NORMAL VARIATION Q >66mS, age<31 female, <40 female SIGNIFICANT RHYTHM CHANGES    Heart Rate 75 BPM   P Axis 24 degrees   I-40 Axis 0 degrees   T-40 Axis 9 degrees   QRS Axis 11 degrees   ST Axis 40 degrees   T Wave Axis -3 degrees   PCP:Pt States Has No Pcp/Ref  Imaging, diagnostic results, and laboratory results were reviewed.  See top of note for Impression/Recommendations.

## 2015-02-09 DIAGNOSIS — F410 Panic disorder [episodic paroxysmal anxiety] without agoraphobia: Secondary | ICD-10-CM | POA: Insufficient documentation

## 2015-02-09 DIAGNOSIS — F411 Generalized anxiety disorder: Secondary | ICD-10-CM | POA: Insufficient documentation

## 2015-02-09 DIAGNOSIS — R519 Headache, unspecified: Secondary | ICD-10-CM | POA: Insufficient documentation

## 2015-02-12 ENCOUNTER — Encounter: Payer: Self-pay | Admitting: Oncology

## 2015-02-12 ENCOUNTER — Ambulatory Visit: Payer: Medicaid Other | Admitting: Oncology

## 2015-02-12 DIAGNOSIS — E559 Vitamin D deficiency, unspecified: Secondary | ICD-10-CM | POA: Insufficient documentation

## 2015-02-12 NOTE — [facility] Notes (Unsigned)
Patient ID: Angel [name], female   DOB: 06-06-1990, 24 y.o.   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 Please see my 08/02/2014 note for a summary of that evaluation. I felt aspirin alone was sufficient prophylaxis at this time. She failed to report for today's visit. I would be happy to see her again in the future if things change.

## 2015-03-12 ENCOUNTER — Other Ambulatory Visit: Payer: Self-pay | Admitting: Nurse Practitioner

## 2015-03-14 ENCOUNTER — Other Ambulatory Visit: Payer: Self-pay | Admitting: Nurse Practitioner

## 2015-03-15 NOTE — Telephone Encounter (Signed)
Only seen by e-visit

## 2015-03-15 NOTE — Telephone Encounter (Signed)
Has only been seen by e visit- either needs to see PCP or resubmit and E visit

## 2015-03-27 NOTE — [facility] Notes (Signed)
 [facility] [address] [place], [place]  [address] Phone:  [phone]   Fax:  [phone]   NAME:  Angel [name]  DOB: 1990-10-10 Date of Visit: 03/26/2015 MRN: [id number] PCP:  Angel Nada Pack, MD    History of Present Illness The patient is a 24 y.o. Female who presents in hospital follow-up for TIA.  [name] and was admitted to [facility] September 13 through September 15 with acute TIA.  She also has a history of anxiety and did not tolerate buspirone so is currently treating with Xanax  as needed.  Taking aspirin  325 mg daily and Lipitor when he milligrams daily.  Her primary care provider is managing her anemias.  PT/OT was ordered during her hospital stay however she has not been contacted yet.  He has no residual weakness or problems with ADLs related to her TIA.  She has plans to start an exercise program but has not initiated yet.  Her main concern today is addressing her anxiety.   She is followed by her PCP Angel Nada Pack, MD.     History PMH, FH, and SH are reviewed and updated in chart.  Medications Current Outpatient Prescriptions  Medication Sig Dispense Refill  . ALPRAZolam  (XANAX ) 1 mg Oral Tablet TK 1 T PO TID  0  . amoxicillin  (AMOXIL ) 500 mg Oral Capsule Take 500 mg by mouth twice a day.    . aspirin  (ECOTRIN) 325 mg Oral Tablet, Delayed Release (E.C.) Take 325 mg by mouth daily.    . atorvastatin  (LIPITOR) 20 mg Oral Tablet Take 20 mg by mouth every morning.    . cholecalciferol, Vitamin D3, 2,000 unit Oral Capsule Take 1 Cap by mouth daily. 30 Cap   . cyanocobalamin  (VITAMIN B-12) 500 mcg Oral Tablet Take 1 Tab by mouth daily. 30 Tab 11  . diphenhydrAMINE  (BENADRYL ) 25 mg Oral Capsule Take 25 mg by mouth every 4 hours as needed for Itching.    . ergocalciferol, vitamin d2, (VITAMIN D) 50,000 unit Oral Capsule Take 1 Cap by mouth every week for 12 doses. 12 Cap 0  . escitalopram oxalate (LEXAPRO) 10 mg Oral Tablet Take 1 Tab by mouth daily. 30 Tab 3  .  ferrous sulfate  325 mg (65 mg iron ) Oral Tablet, Delayed Release (E.C.) Take 1 Tab by mouth Three Times a Day With Meals. 90 Tab 3  . guaifenesin ER (MUCINEX) 600 mg Oral Tablet Sustained Release Take 1,200 mg by mouth twice a day as needed.    . ibuprofen  (MOTRIN , ADVIL ) 600 mg Oral Tablet Take 1 Tab by mouth every 6 hours as needed for Pain. 30 Tab 2  . loratadine  (CLARITIN ) 10 mg Oral Tablet Take 1 Tab by mouth daily. 30 Tab 3  . omeprazole  (PRILOSEC) 40 mg Oral Capsule, Delayed Release(E.C.) Take 1 Cap by mouth before breakfast. 30 Cap 3  . sulfamethoxazole-trimethoprim , ds, (BACTRIM DS, SEPTRA DS) 800-160 mg Oral Tablet Take 1 Tab by mouth twice a day for 5 days. 10 Tab 0   No current facility-administered medications for this visit.   Allergy Allergies  Allergen Reactions  . Nkda [No Known Allergies]     Review of Systems Negative for fever, eye problems, ear pain, dizziness, chest pain, cough, SOB, n/v, difficulty urination, joint pain, rash, seizure, depression, bleeding, hives Positive for anemia being managed by PCP  Physical Exam  BP 148/83 mmHg  Pulse 83  Temp(Src) 98.1 F (36.7 C)  Ht 5' 8 (1.727 m)  Wt 275 lb (124.739 kg)  BMI 41.82 kg/m2   General Alert, cooperative, well dressed, well hydrated obese female in no acute distress   Chest and Lung Exam Breath sounds: -  Clear to auscultation bilaterally.  Cardiovascular Heart - regular rate and rhythm and no murmurs.  Neurologic Mental Status - Alert, attentive, speech fluent, cognition normal including short and long term recall and the patient's fund of knowledge is normal for age. Carnial Nerves: II Optic: - PERRLA, fundi show discrete disc margins and normal retina, visual fields full to confrontation. III Oculomotor: - pupillary constriction normal and oculomotor nerve motility normal. VII Facial: - Facial strength normal and symmetric. VIII Acoustic - Bilateral - Hearing normal.   IX Glossopharyngeal /  X Vagus - Palate elevates symmetrically and uvula midline. XI Accessory: - Shoulder shrug symmetric, turns face against resistance without difficulty. XII Hypoglossal - Bilateral - Tongue midline with normal bilateral strength. Eye Movements: - Normal horizontal and vertical gaze without sustained nystagmus. Sensory: Light Touch: Intact - Globally. Temperature: Intact - Globally. Vibration: Intact - Globally. Motor - normal bulk and tone with 5/5 strength throughout, no pronator drift present Cerebellar - Normal station, gait, balance, finger-nose-finger, heel-knee-shin, rapid alternating movements, Romberg testing negative. General Assessment of Reflexes: - Deep tendon reflexes normal & symmetric.  No clonus noted at ankles.  Gait - Normal including heal, toe and tandem gait.  Assessment/Plan:    [name] is a pleasant morbidly obese 24 year old female here today for hospital follow-up.  She has a past medical history of TIA, migraine and anxiety.  1. Hospital follow-up TIA: Continue aspirin  325 mg daily and Lipitor 20 mg daily, encouraged heart healthy diet and increasing daily activity level.  No further stroke symptoms and no further stroke studies needed today.  2. Anxiety: Lexapro 10 mg daily started, reviewed side effects profile  Patient to follow-up in 6 weeks.  Electronically signed by Angel Fitting, FNP 03/27/2015  9:22 AM

## 2015-09-07 DIAGNOSIS — I82619 Acute embolism and thrombosis of superficial veins of unspecified upper extremity: Secondary | ICD-10-CM

## 2015-09-07 HISTORY — DX: Acute embolism and thrombosis of superficial veins of unspecified upper extremity: I82.619

## 2015-09-09 IMAGING — CT CT HEAD W/O CM
2 series · 15 of 30 positions shown, 19 images · non-contrast
Comparison: Prior MRI from 05/13/2014.

CLINICAL DATA: New onset right arm and leg tingling and numbness.
Patient admitted with left arm and leg paresthesias.

EXAM:
CT HEAD WITHOUT CONTRAST
TECHNIQUE: Contiguous axial images were obtained from the base of the skull
through the vertex without intravenous contrast.

[Series 201: head w/o, idose (1) · axial · non-contrast · 0.41mm/px · z∈[+112,+232]mm · 13 of 29 slices shown, 17 images]
[im 3/29  brain]
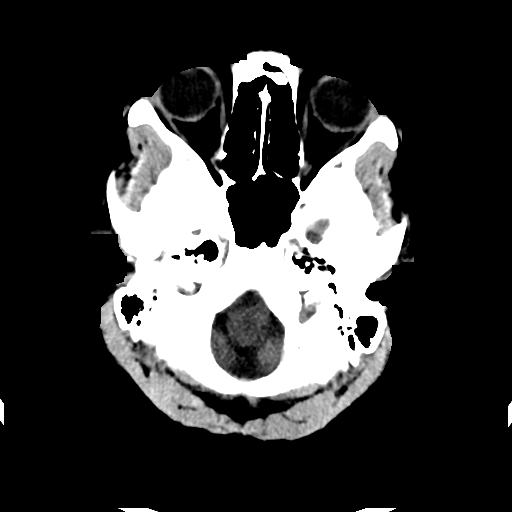
[im 3/29  bone]
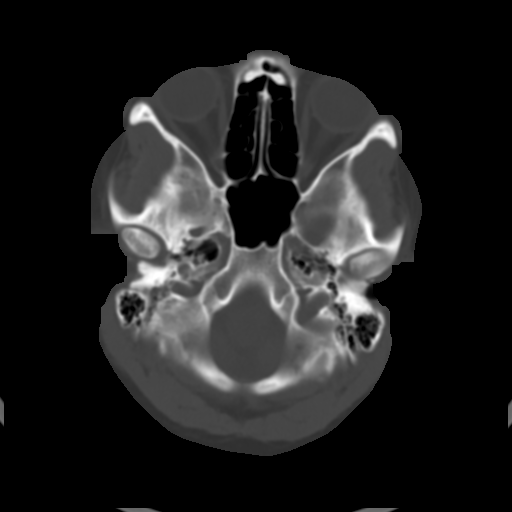
[im 5/29  brain]
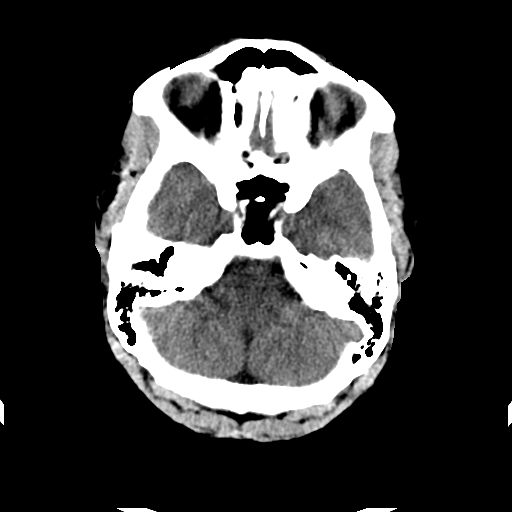
[im 7/29  brain]
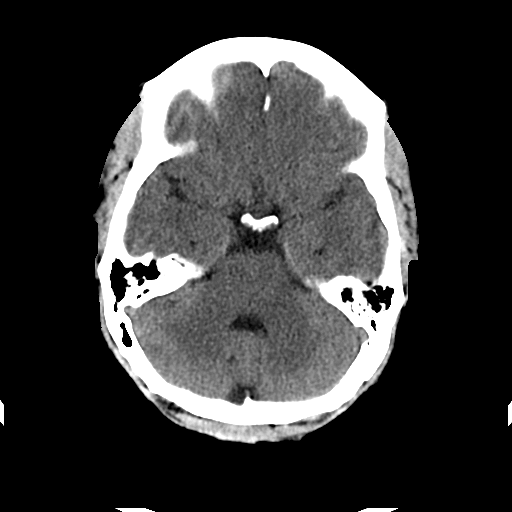
[im 9/29  brain]
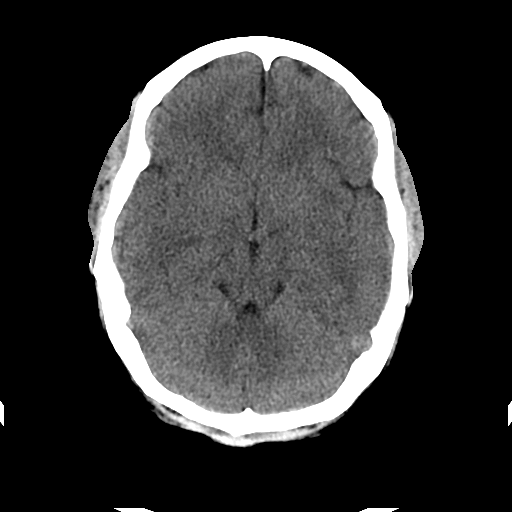
[im 11/29  brain]
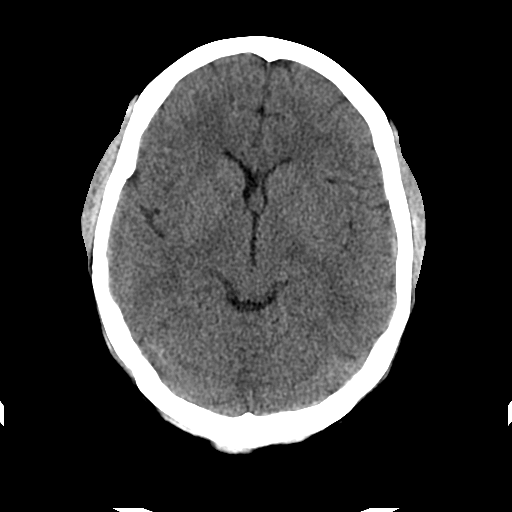
[im 11/29  bone]
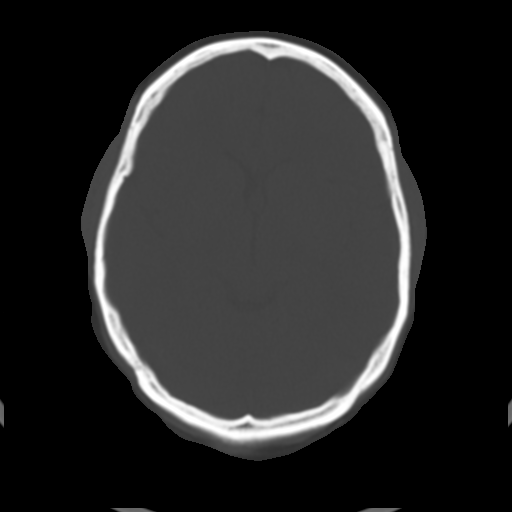
[im 13/29  brain]
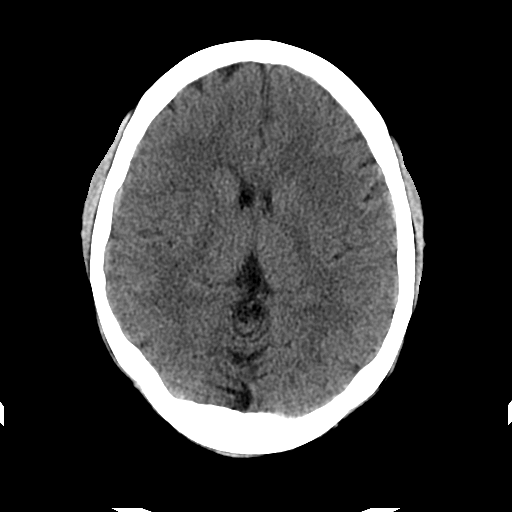
[im 15/29  brain]
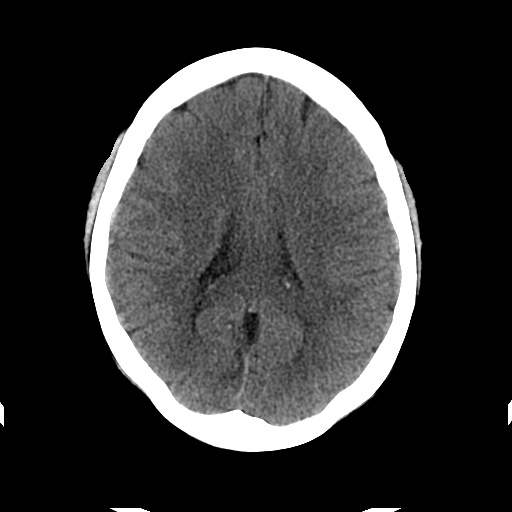
[im 17/29  brain]
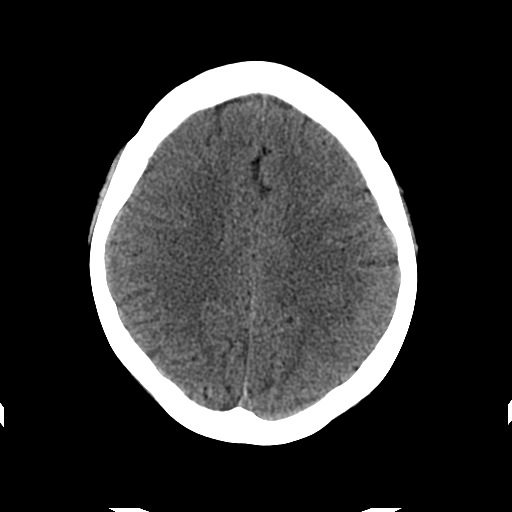
[im 19/29  brain]
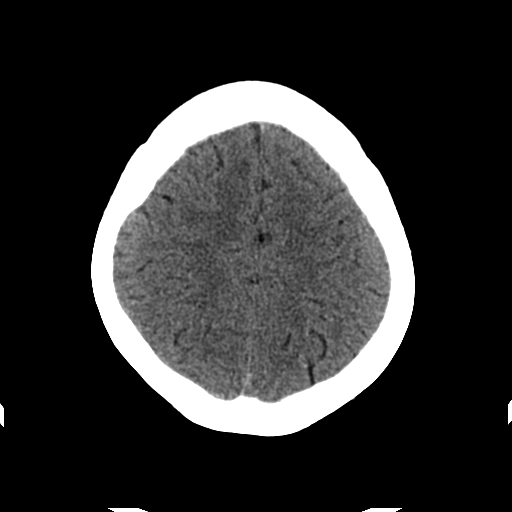
[im 19/29  bone]
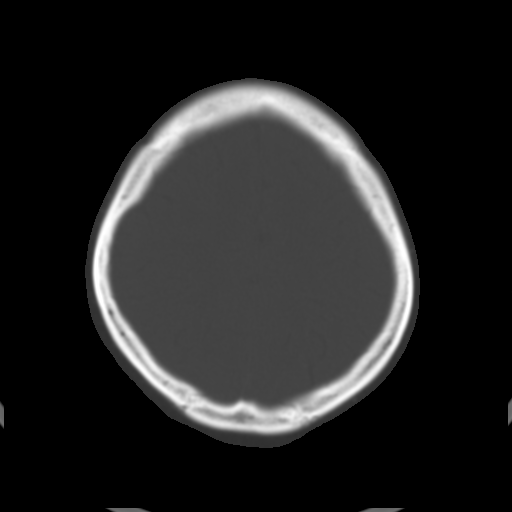
[im 21/29  brain]
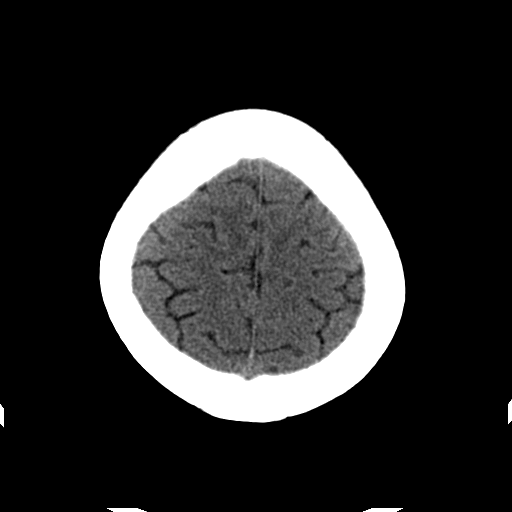
[im 23/29  brain]
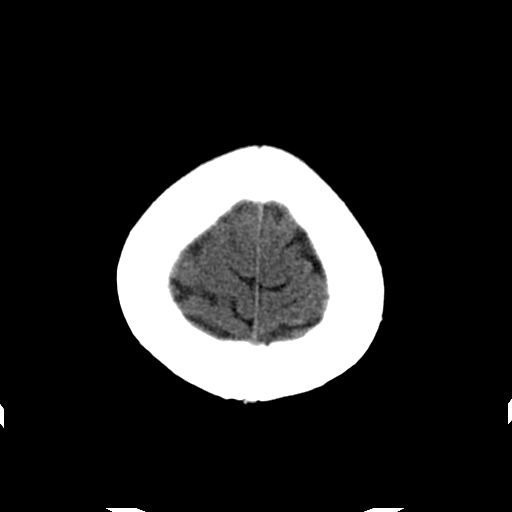
[im 25/29  brain]
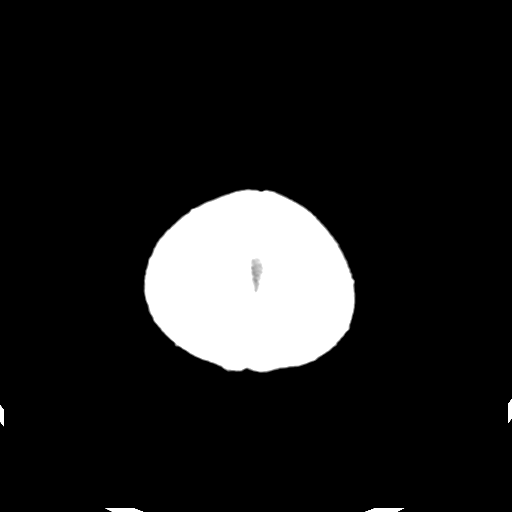
[im 27/29  brain]
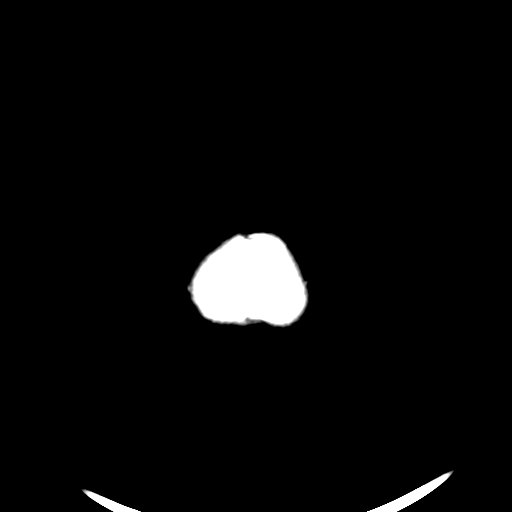
[im 27/29  bone]
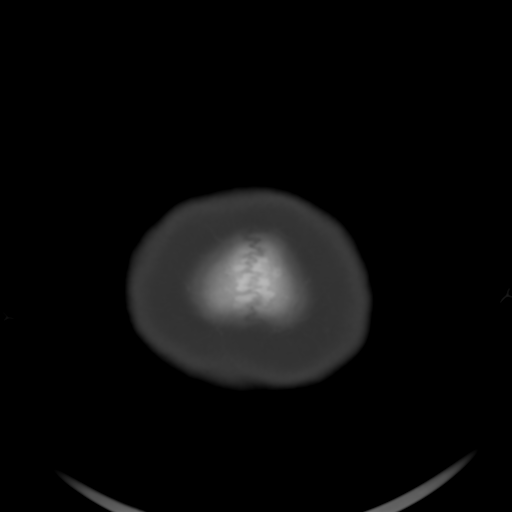

[Series 202: head w/o bone, idose (1) · axial · non-contrast · 0.41mm/px · z∈[+112,+132]mm · 2 of 29 slices shown]
[im 3/29  bone]
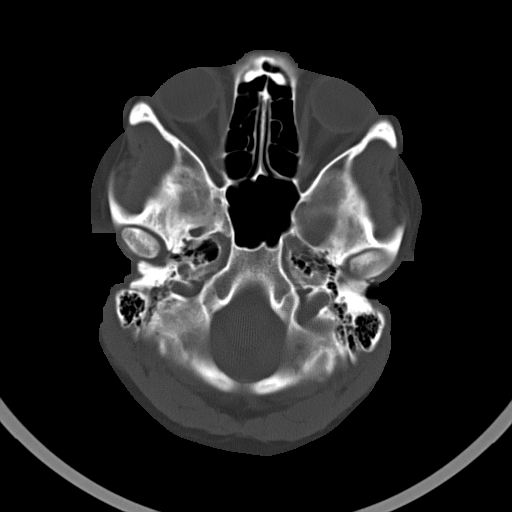
[im 7/29  bone]
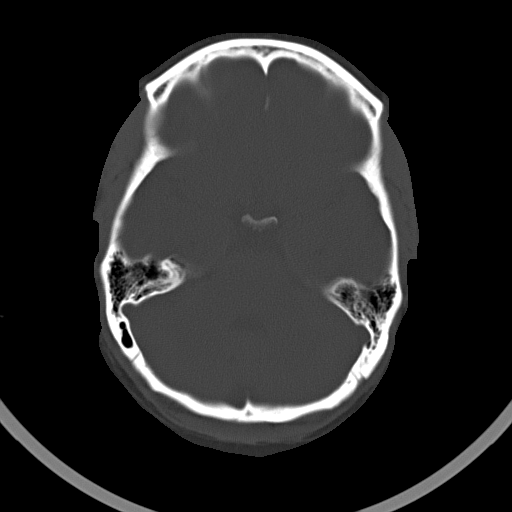

[15 of 30 positions shown; findings below may reference images not displayed]

FINDINGS: Previously identified small ischemic infarcts involving the right
PCA territory and splenium of the corpus callosum are grossly
stable, although not well evaluated on this noncontrast CT. No new
large vessel territory infarct identified. Gray-white matter
differentiation is maintained. No intracranial hemorrhage. No
extra-axial fluid collection.

No mass lesion, mass effect, or midline shift. No hydrocephalus. No
extra-axial fluid collection.

Scalp soft tissues within normal limits. Calvarium intact. No acute
abnormality seen about the orbits.

Paranasal sinuses and mastoid air cells are clear.
IMPRESSION: 1. No new intracranial process identified.
2. Grossly stable known ischemic infarcts involving the right PCA
territory and splenium.

## 2015-09-11 IMAGING — XA IR ANGIO INTRA EXTRACRAN SEL COM CAROTID INNOMINATE BILAT MOD SE
1 series · 13 of 24 positions shown · IV contrast (IODINE)
Comparison: none

CLINICAL DATA: Left-sided visual field deficit. Abnormal MRI of the
brain.

[Series 300: neuro · 13 of 185 slices shown]
[im 1/185]
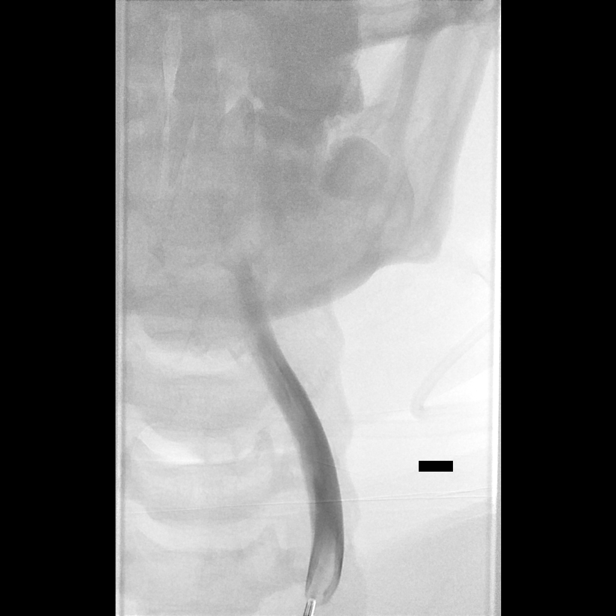
[im 17/185]
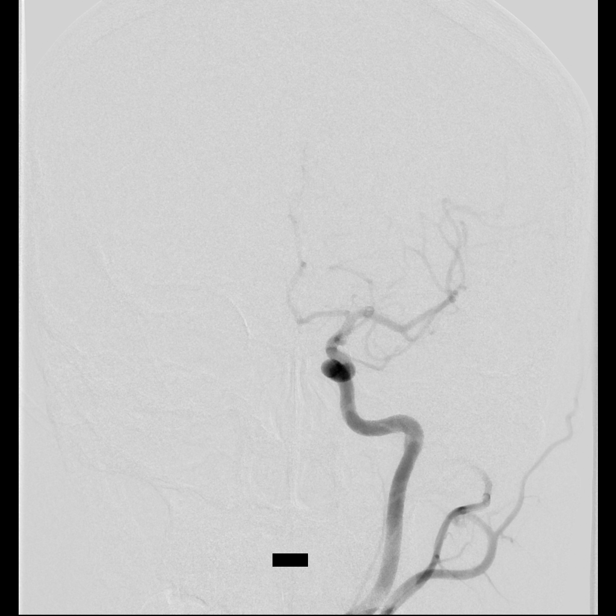
[im 33/185]
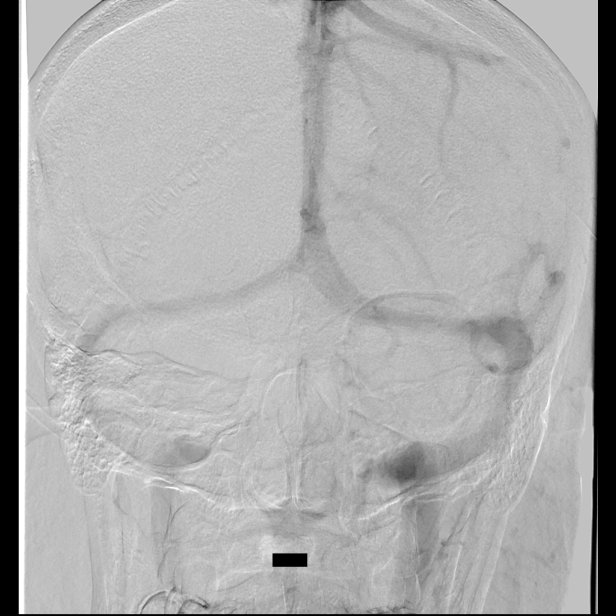
[im 49/185]
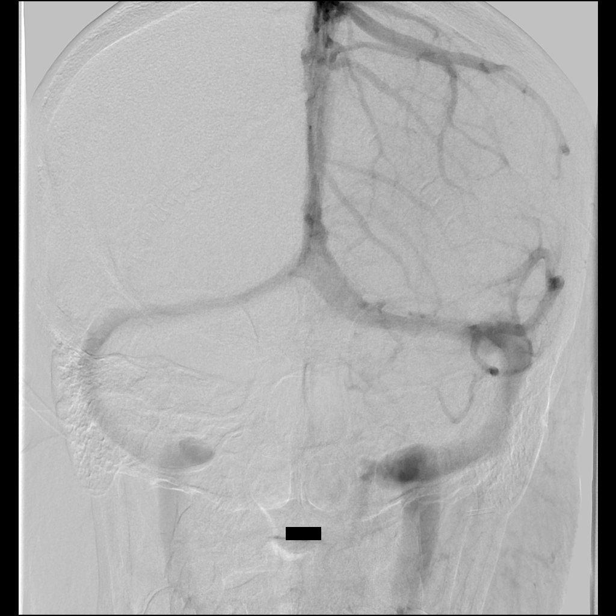
[im 65/185]
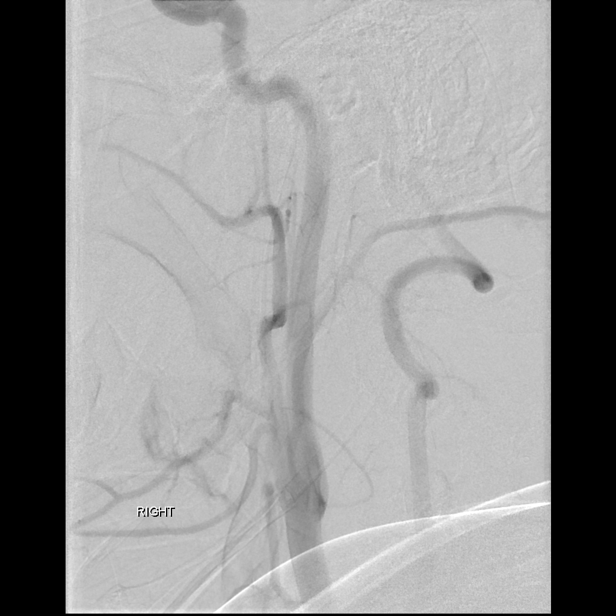
[im 81/185]
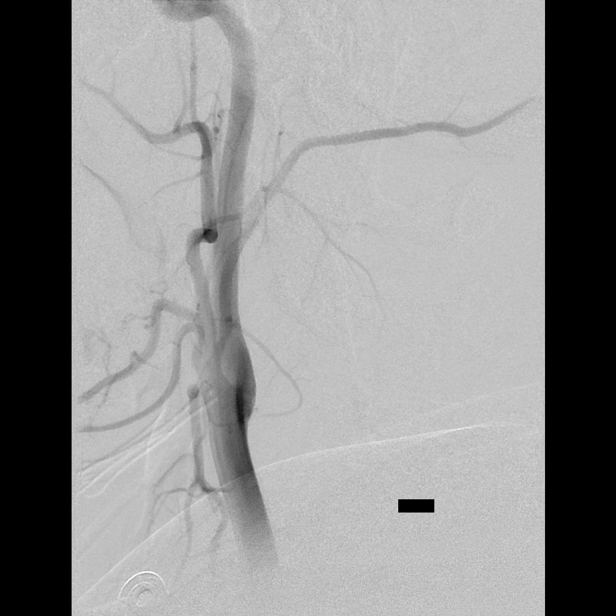
[im 97/185]
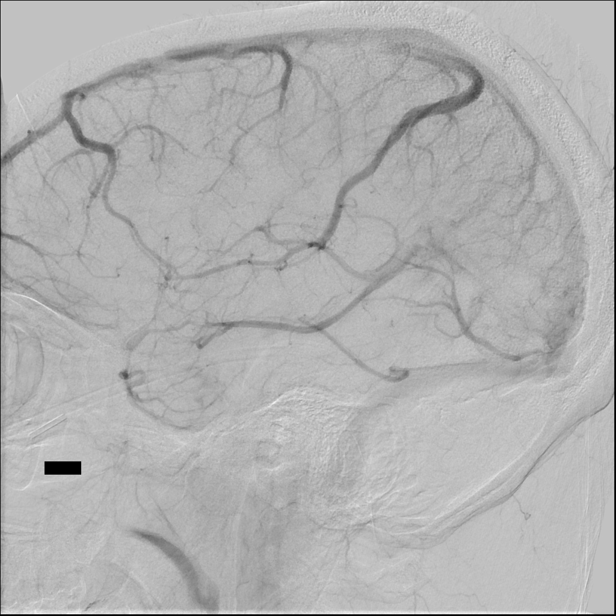
[im 105/185]
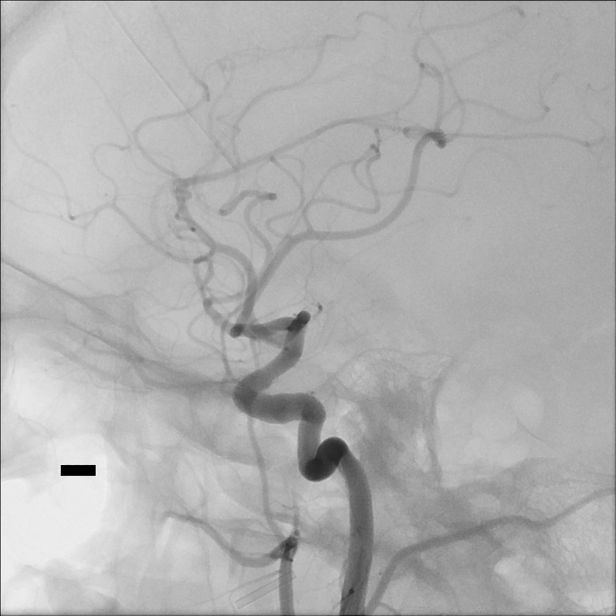
[im 121/185]
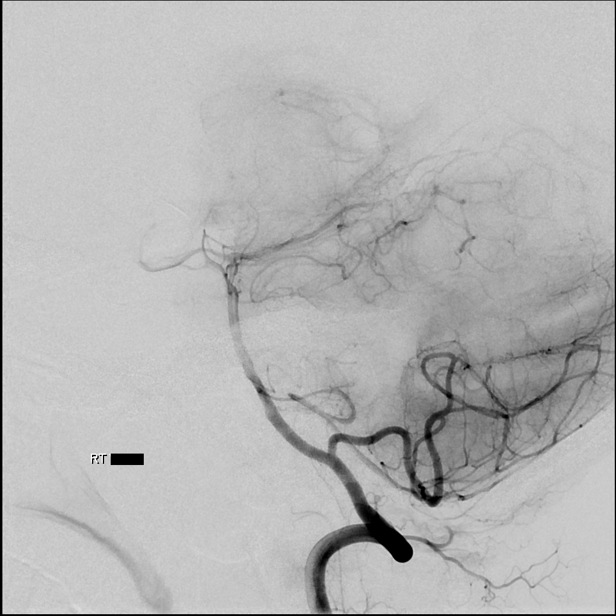
[im 137/185]
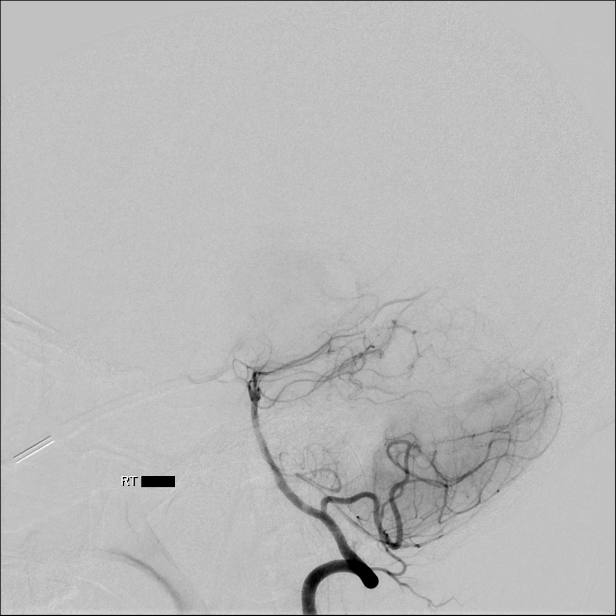
[im 153/185]
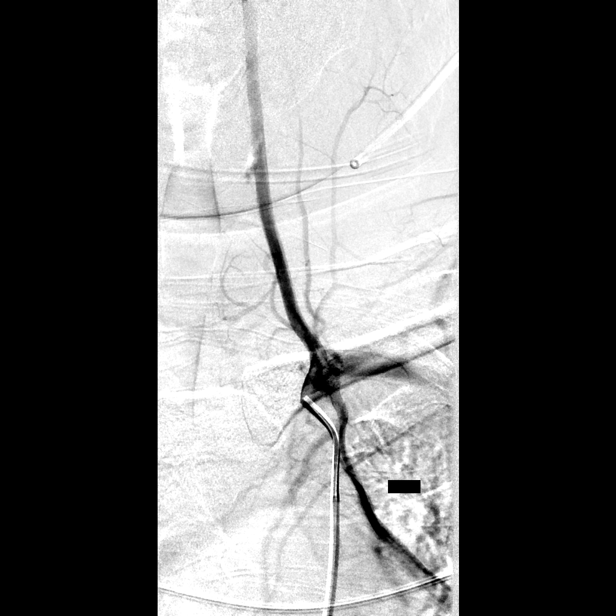
[im 169/185]
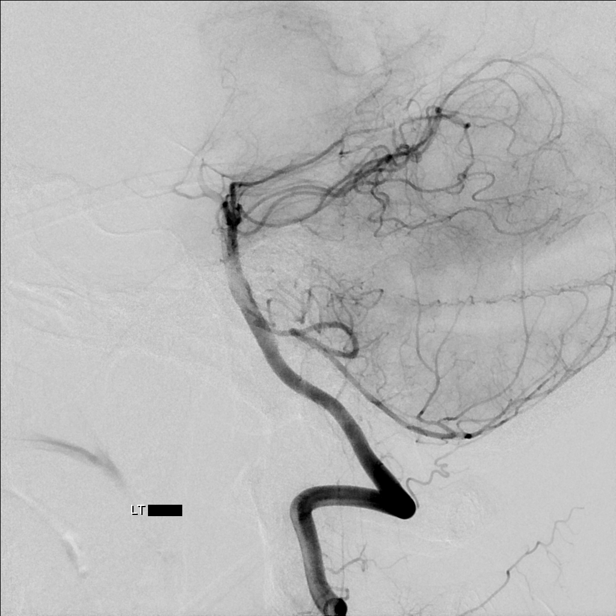
[im 185/185]
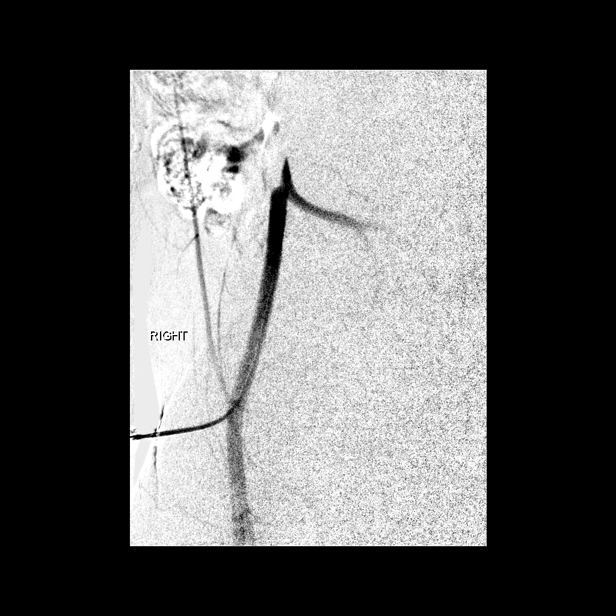

[13 of 24 positions shown; findings below may reference images not displayed]

EXAM:
BILATERAL COMMON CAROTID AND INNOMINATE ANGIOGRAPHY AND BILATERAL
VERTEBRAL ARTERY ANGIOGRAMS

PROCEDURE:
Contrast: 60mL OMNIPAQUE IOHEXOL 300 MG/ML  SOLN

Anesthesia/Sedation:  Conscious sedation.

Medications:  Versed 1 mg IV.  Fentanyl 25 mcg IV.

Following a full explanation of the procedure along with the
potential associated complications, an informed witnessed consent
was obtained.

The right groin was prepped and draped in the usual sterile fashion.
Thereafter using modified Seldinger technique, transfemoral access
into the right common femoral artery was obtained without
difficulty. Over a 0.035 inch guidewire, a 5 French Pinnacle sheath
was inserted. Through this, and also over 0.035 inch guidewire, a 5
French JB1 catheter was advanced to the aortic arch region and
selectively positioned in the right common carotid artery, the right
vertebral artery, the left common carotid artery and the left
vertebral artery.

There were no acute complications. The patient tolerated the
procedure well.
FINDINGS: The left common carotid arteriogram demonstrates the left external
carotid artery and its major branches to be normal.

The left internal carotid artery at the bulb to the cranial skull
base opacifies normally.

There is mild fusiform dilatation of the proximal cavernous segment
probably related to chronic hypertension. Distal to this the
supraclinoid segment is widely patent.

A left posterior communicating artery is seen opacifying the left
posterior cerebral artery distribution.

The inferior occipital branches of the P3 region demonstrate smooth
focal areas of segmental narrowing interspersed with normal caliber
vessels. Similar changes are seen angiographically of the left
ophthalmic artery at its bifurcation.

The left middle and the left anterior cerebral artery opacify into
the capillary and venous phases.

The right vertebral artery origin is normal.

The vessel is seen to opacify normally to the cranial skull base.

Normal opacification is seen of the right posterior-inferior
cerebellar artery and the right vertebrobasilar junction.

The basilar artery, the proximal posterior cerebral arteries, the
superior cerebellar arteries and the anterior inferior cerebellar
arteries are seen to opacify normally into the capillary and venous
phases.

There is non filling of the right posterior cerebral artery at the
distal P2 P3 segment.

The right common carotid arteriogram demonstrates the right external
carotid artery and its major branches to be normal.

The right internal carotid artery at the bulb to the cranial skull
base is normally opacified.

The petrous, cavernous and supraclinoid segments are widely patent.

There is mild narrowing at the supraclinoid segment just distal to
the origin of the ophthalmic artery.

The right middle and the right anterior cerebral arteries opacify
into the capillary and venous phases.

Cross filling via the anterior communicating artery of the left
anterior cerebral artery is seen.

The left vertebral artery origin is normal. The vessel is seen to
opacify normally to the cranial skull base.

There is normal opacification of left posterior inferior cerebellar
artery with suggestion of the left anterior inferior cerebral
artery/posterior inferior cerebellar artery complex.

The posterior cerebral arteries proximally, the superior cerebellar
arteries and the anterior inferior cerebellar arteries opacify
normally into the capillary and venous phases.

Again demonstrated is the non opacification of the right posterior
cerebral artery distal to the P2 P3 region.
IMPRESSION: Angiographically non visualization of the right posterior cerebral
artery at the P2 P3 region consistent with angiographic occlusion.

Tapered smooth narrowing of the distal P2 branches of the left
posterior cerebral artery, and also of the left ophthalmic artery.
These findings are nonspecific. They may represent vasospasm versus
vasculitis versus less likely arteriosclerosis given the patient
age. Clinical correlation is suggested.

## 2015-10-04 ENCOUNTER — Other Ambulatory Visit: Payer: Self-pay | Admitting: Neurology

## 2016-09-12 DIAGNOSIS — R079 Chest pain, unspecified: Secondary | ICD-10-CM | POA: Diagnosis not present

## 2016-11-03 DIAGNOSIS — F419 Anxiety disorder, unspecified: Secondary | ICD-10-CM | POA: Diagnosis not present

## 2016-11-03 DIAGNOSIS — D6859 Other primary thrombophilia: Secondary | ICD-10-CM | POA: Diagnosis not present

## 2016-11-03 DIAGNOSIS — L0291 Cutaneous abscess, unspecified: Secondary | ICD-10-CM | POA: Diagnosis not present

## 2016-11-03 DIAGNOSIS — E785 Hyperlipidemia, unspecified: Secondary | ICD-10-CM | POA: Diagnosis not present

## 2017-03-11 DIAGNOSIS — K760 Fatty (change of) liver, not elsewhere classified: Secondary | ICD-10-CM | POA: Diagnosis not present

## 2017-03-11 DIAGNOSIS — R1084 Generalized abdominal pain: Secondary | ICD-10-CM | POA: Diagnosis not present

## 2017-03-11 DIAGNOSIS — R12 Heartburn: Secondary | ICD-10-CM | POA: Diagnosis not present

## 2017-03-11 DIAGNOSIS — R110 Nausea: Secondary | ICD-10-CM | POA: Diagnosis not present

## 2017-10-15 NOTE — Progress Notes (Signed)
 Progress Note: Name: Angel [name]  DOB: 03/09/1991 DATE OF VISIT:  10/09/2017 MRN:  [id number]     Impression:  H/O right occipital lobe infarction. Now 4 months pregnant with second child, first child was previous to her stroke. Migraine headaches. 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  Recommendations:  Discussed with her at length about stroke risk in pregnancy after cryptogenic stroke.  Continue ASA 81 mg daily that her Ob has put her one for pre-eclampsia prophylaxis. Will probably need to do further hypercoagulable testing including Factor V leiden, Prothrombin gene mutation, antiphospoholipid antibody testing to complete her hypercoagulable profile. If any of these are positive would need to consider risks and benefits of LMWH vs ASA. Discussed with her the importance of having her blood pressure under control. RTC in 3 months.   Chief Complaint  Patient presents with  . Neurologic Problem    Migraines    HPI Ms. Binning is a 27 year old female who had been seen in the hospital by me in 2016. At that time she had presented with transient symptoms of dizziness and bilateral leg weakness. She had a history of loss of vision a year prior to that and was found to have a right occipital stroke. Her work-up in [place] at that time had included an MRI brain which showed the subacute stroke and a hypercoagulable profile which showed a low protein C and S. She was evaluated by Hematology at that time and when they rechecked her Protein C and S these were normal. During her  hospital admission in 2016 her MRI brain showed evidence of the old stroke but no new stroke. 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 She had an ECHO and a hypercoagulable profile which was negative. She says that since then she has been doing well. She does not report any recent episodes of weakness or numbness. She denies any recent vision problems. She has a history of migraine headaches but says that her headaches have improved on their own. She is currently 4 months pregnant and so her Ob-Gyn wanted her to come back and see Neurology due to her prior stroke. They had started her on ASA 81 mg daily for pre-eclampsia prophylaxis. She denies any history of DVTs or PEs. She denies recurrent miscarriages. She however says that multiple family members have had DVTs and PEs. She says that her previous pregnancy was before her stroke and was a normal delivery.  Past Medical History:  Diagnosis Date  . Anemia    anemia all her life, menorrhagia Nov 2017-resolved with Mirena , was seeing Dr. Freddie  . Anxiety state    no meds currently, no counselor or therapist   . Basilic vein thrombosis 09/07/2015   Left, Per US  09/06/15  . CVA (cerebral infarction) 04/2014   multiple (occipital, distal PCA)-Dr. Ines suspected migranous vasospasm as w/u unremarkable  . Encounter for IUD removal 02/11/2017  . GBS carrier 07/15/11, 06/06/11  . Heel spur    left seen on X-ray   . History of chlamydia 05/30/2009  . History of seasonal allergies    OTC Zyrtec prn   . Migraine    previously seeing Dr. Ines  at [facility]  . Panic attacks    no meds currently, no therapist or counselor  . Positive D-dimer 06/18/2017   [facility] ED  . Pregnancy 2019   current  . Protein S deficiency (CMS/HCC)    with multiple normal levels after low level  . UTI (urinary tract infection) 06/12/2017   [facility] ED--E. Coli  . Vaginal delivery    x 1   Social History   Socioeconomic History  . Marital  status: Married    Spouse name: Janard Ducey   . Number of children: 1  . Years of education: Not on file  . Highest education level: Some college, no degree  Occupational History  . Occupation: Dentist: Product manager    Comment: nutrition   Social Needs  . Financial resource strain: Not on file  . Food insecurity:    Worry: Not on file    Inability: Not on file  . Transportation needs:    Medical: Not on file    Non-medical: Not on file  Tobacco Use  . Smoking status: Never Smoker  . Smokeless tobacco: Never Used  Substance and Sexual Activity  . Alcohol use: Not Currently    Comment: rarely on special occasions (07/21/17)  . Drug use: No  . Sexual activity: Yes    Partners: Male    Birth control/protection: None  Lifestyle  . Physical activity:    Days per week: Not on file    Minutes per session: Not on file  . Stress: Not on file  Relationships  . Social connections:    Talks on phone: Not on file    Gets together: Not on file    Attends religious service: Not on file    Active member of club or organization: Not on file    Attends meetings of clubs or organizations: Not on file    Relationship status: Not on file  . Intimate partner violence:    Fear of current or ex partner: Not on file    Emotionally abused: Not on file    Physically abused: Not on file    Forced sexual activity: Not on file  Other Topics Concern  . Hearing Deficit Not Asked  . Communication concerns Not Asked  . Mobility Deficits Not Asked  . Vision Deficits Not Asked    Comment: wears glasses  . Equipment/Assistive Technology No  . Memory Deficits Not Asked  . Self Care Deficits Not Asked  . Swallowing Deficits Not Asked  . Military Service Not Asked  . Risk For Falls Not Asked  . Seat Belt Not Asked  . Use of Helmet (Recreational) Not Asked  . Hobby Hazards Not Asked  . Occupational Exposure/Hazards Not Asked  . Self-Exams Performed Not Asked  . Stress  Concerns Not Asked  . Exercise Regularly Not Asked  . Weight concerns Not Asked  . Sleep Concern Not Asked  . Special Diet Not Asked  . Back Care Not Asked  . Blood Transfusions No  Social History Narrative   Lives in [place]   Student at [employer], nutrition   Pets:  none   Family History  Problem Relation Name Age of Onset  . Diabetes Mother    . Hypertension Mother    . Hyperlipidemia Mother    . Blood Clots Mother         leg DVTs that needed to be surgically treated  . Other Endocrine Disorder Brother Eva Dawn  prediabetes  . Anxiety Brother    . Heart Disease Maternal Grandmother    . Diabetes Maternal Grandmother    . Hypertension Maternal Grandmother    . Heart Disease Maternal Grandfather    . Hypertension Maternal Grandfather    . Diabetes Maternal Grandfather     Current Outpatient Medications  Medication Sig Dispense Refill  . acetaminophen  (TYLENOL ) 500 mg Oral Tablet Take 500 mg by mouth every 6 hours as needed for Pain.    . aspirin  81 mg Oral Tablet, Delayed Release (E.C.) Take 1 Tab by mouth daily. 100 Tab 3  . enoxaparin  (LOVENOX ) 40 mg/0.4 mL Subcutaneous Syringe Give 0.4 mL subcutaneous daily for 30 days. 12 mL 3  . omeprazole  (PRILOSEC) 20 mg Oral Take 1 Cap by mouth daily. 30 Cap 3  . prenatal vits62/FA/om3/dha/epa (PRENATAL GUMMY ORAL) Take 2 Each by mouth daily.     No current facility-administered medications for this visit.    No Known Allergies  Review of Systems General:   , ,  Skin:   HEENT:   ,  ,  ,  ,  , ,  ,    Respiratory:   ,  ,   Breasts:   ,   Cardiovascular:    Gastrointestinal:   ,  ,  ,  ,  ,   Female Genitourinary:   ,  ,   Musculoskeletal:   ,  ,   Neurological:   ,  ,   Psychiatric:    Hematology:  ,  ,   Allergy:      Physical examination:  Vitals:Blood pressure 122/80, pulse 98, temperature 98.1 F (36.7 C), temperature source Temporal, height 5' 7 (1.702 m), weight 282 lb (127.9 kg), last menstrual  period 04/28/2017.    General Physical Examination: The patient was well developed, in no acute distress.   Skin: (Restricted to face and distal upper and lower extremities): Normal.   Head and neck Eyes: Conjunctiva, lids, pupils, and irises clear. Neck: supple  ENT:  Nose:normal Oropharnyx:no erythema or exudates noted. Teeth and gums normal.     Mental Status: Intact speech and language. Orientation, attention, memory, and fund of knowledge are intact as manifested by the patient's ability to furnish a complete and lucid medical history.   Cranial Nerves: II-Visual fields full. Pupils equal and reactive. Discs sharp. III, IV and VI: Extraocular movements intact. No nystagmus. V: Motor and sensory branches intact. VII: Face symmetric. VIII: Hearing intact bilaterally. IX, X and XII: Tongue, uvula and palate mid position. XI: Neck strength 5/5 to shoulder shrug.    Musculoskeletal: Gait: Within normal limits.   Sensory: Neuro Intact to pin prick and light touch.   Motor: Normal tone, bulk and strength. There is no pronator drift.  Cerebellar: Good fine finger movements on finger-to-nose bilaterally.    General Assessment of Reflexes: Neuro 1-2+ and symmetric with toes down going bilaterally.     Electronically signed by Ardie Schirmer, MD 10/15/2017  9:52 AM

## 2017-11-24 DIAGNOSIS — O0992 Supervision of high risk pregnancy, unspecified, second trimester: Secondary | ICD-10-CM | POA: Diagnosis not present

## 2017-11-24 DIAGNOSIS — Z6841 Body Mass Index (BMI) 40.0 and over, adult: Secondary | ICD-10-CM | POA: Diagnosis not present

## 2017-11-24 DIAGNOSIS — O99213 Obesity complicating pregnancy, third trimester: Secondary | ICD-10-CM | POA: Diagnosis not present

## 2017-11-24 DIAGNOSIS — O99343 Other mental disorders complicating pregnancy, third trimester: Secondary | ICD-10-CM | POA: Diagnosis not present

## 2017-11-24 DIAGNOSIS — F411 Generalized anxiety disorder: Secondary | ICD-10-CM | POA: Diagnosis not present

## 2017-11-24 DIAGNOSIS — Z23 Encounter for immunization: Secondary | ICD-10-CM | POA: Diagnosis not present

## 2017-11-24 DIAGNOSIS — Z3A28 28 weeks gestation of pregnancy: Secondary | ICD-10-CM | POA: Diagnosis not present

## 2017-12-08 DIAGNOSIS — O99353 Diseases of the nervous system complicating pregnancy, third trimester: Secondary | ICD-10-CM | POA: Diagnosis not present

## 2017-12-08 DIAGNOSIS — O365930 Maternal care for other known or suspected poor fetal growth, third trimester, not applicable or unspecified: Secondary | ICD-10-CM | POA: Diagnosis not present

## 2017-12-08 DIAGNOSIS — F411 Generalized anxiety disorder: Secondary | ICD-10-CM | POA: Diagnosis not present

## 2017-12-08 DIAGNOSIS — Z3A30 30 weeks gestation of pregnancy: Secondary | ICD-10-CM | POA: Diagnosis not present

## 2017-12-08 DIAGNOSIS — O99213 Obesity complicating pregnancy, third trimester: Secondary | ICD-10-CM | POA: Diagnosis not present

## 2017-12-08 DIAGNOSIS — O99343 Other mental disorders complicating pregnancy, third trimester: Secondary | ICD-10-CM | POA: Diagnosis not present

## 2017-12-08 DIAGNOSIS — Z6841 Body Mass Index (BMI) 40.0 and over, adult: Secondary | ICD-10-CM | POA: Diagnosis not present

## 2017-12-08 DIAGNOSIS — Z3A36 36 weeks gestation of pregnancy: Secondary | ICD-10-CM | POA: Diagnosis not present

## 2017-12-08 DIAGNOSIS — E669 Obesity, unspecified: Secondary | ICD-10-CM | POA: Diagnosis not present

## 2017-12-22 DIAGNOSIS — R809 Proteinuria, unspecified: Secondary | ICD-10-CM | POA: Diagnosis not present

## 2017-12-22 DIAGNOSIS — O2603 Excessive weight gain in pregnancy, third trimester: Secondary | ICD-10-CM | POA: Diagnosis not present

## 2017-12-22 DIAGNOSIS — O24410 Gestational diabetes mellitus in pregnancy, diet controlled: Secondary | ICD-10-CM | POA: Diagnosis not present

## 2017-12-22 DIAGNOSIS — R822 Biliuria: Secondary | ICD-10-CM | POA: Diagnosis not present

## 2017-12-22 DIAGNOSIS — O99213 Obesity complicating pregnancy, third trimester: Secondary | ICD-10-CM | POA: Diagnosis not present

## 2017-12-22 DIAGNOSIS — O0993 Supervision of high risk pregnancy, unspecified, third trimester: Secondary | ICD-10-CM | POA: Diagnosis not present

## 2017-12-22 DIAGNOSIS — R82998 Other abnormal findings in urine: Secondary | ICD-10-CM | POA: Diagnosis not present

## 2017-12-22 DIAGNOSIS — O365930 Maternal care for other known or suspected poor fetal growth, third trimester, not applicable or unspecified: Secondary | ICD-10-CM | POA: Diagnosis not present

## 2017-12-22 DIAGNOSIS — Z8673 Personal history of transient ischemic attack (TIA), and cerebral infarction without residual deficits: Secondary | ICD-10-CM | POA: Diagnosis not present

## 2017-12-22 DIAGNOSIS — Z3A32 32 weeks gestation of pregnancy: Secondary | ICD-10-CM | POA: Diagnosis not present

## 2017-12-22 DIAGNOSIS — E58 Dietary calcium deficiency: Secondary | ICD-10-CM | POA: Diagnosis not present

## 2017-12-22 DIAGNOSIS — O09893 Supervision of other high risk pregnancies, third trimester: Secondary | ICD-10-CM | POA: Diagnosis not present

## 2017-12-22 DIAGNOSIS — I639 Cerebral infarction, unspecified: Secondary | ICD-10-CM | POA: Diagnosis not present

## 2017-12-29 DIAGNOSIS — O365930 Maternal care for other known or suspected poor fetal growth, third trimester, not applicable or unspecified: Secondary | ICD-10-CM | POA: Diagnosis not present

## 2017-12-29 DIAGNOSIS — O99213 Obesity complicating pregnancy, third trimester: Secondary | ICD-10-CM | POA: Diagnosis not present

## 2017-12-29 DIAGNOSIS — R809 Proteinuria, unspecified: Secondary | ICD-10-CM | POA: Diagnosis not present

## 2017-12-29 DIAGNOSIS — O09893 Supervision of other high risk pregnancies, third trimester: Secondary | ICD-10-CM | POA: Diagnosis not present

## 2017-12-29 DIAGNOSIS — Z3A33 33 weeks gestation of pregnancy: Secondary | ICD-10-CM | POA: Diagnosis not present

## 2017-12-29 DIAGNOSIS — Z8673 Personal history of transient ischemic attack (TIA), and cerebral infarction without residual deficits: Secondary | ICD-10-CM | POA: Diagnosis not present

## 2017-12-29 DIAGNOSIS — O24414 Gestational diabetes mellitus in pregnancy, insulin controlled: Secondary | ICD-10-CM | POA: Diagnosis not present

## 2017-12-29 DIAGNOSIS — R82998 Other abnormal findings in urine: Secondary | ICD-10-CM | POA: Diagnosis not present

## 2018-01-05 DIAGNOSIS — Z8673 Personal history of transient ischemic attack (TIA), and cerebral infarction without residual deficits: Secondary | ICD-10-CM | POA: Diagnosis not present

## 2018-01-05 DIAGNOSIS — O0993 Supervision of high risk pregnancy, unspecified, third trimester: Secondary | ICD-10-CM | POA: Diagnosis not present

## 2018-01-05 DIAGNOSIS — Z3A34 34 weeks gestation of pregnancy: Secondary | ICD-10-CM | POA: Diagnosis not present

## 2018-01-05 DIAGNOSIS — O365930 Maternal care for other known or suspected poor fetal growth, third trimester, not applicable or unspecified: Secondary | ICD-10-CM | POA: Diagnosis not present

## 2018-01-05 DIAGNOSIS — R82998 Other abnormal findings in urine: Secondary | ICD-10-CM | POA: Diagnosis not present

## 2018-01-05 DIAGNOSIS — K219 Gastro-esophageal reflux disease without esophagitis: Secondary | ICD-10-CM | POA: Diagnosis not present

## 2018-01-05 DIAGNOSIS — O24414 Gestational diabetes mellitus in pregnancy, insulin controlled: Secondary | ICD-10-CM | POA: Diagnosis not present

## 2018-01-05 DIAGNOSIS — O09893 Supervision of other high risk pregnancies, third trimester: Secondary | ICD-10-CM | POA: Diagnosis not present

## 2018-01-05 DIAGNOSIS — Z113 Encounter for screening for infections with a predominantly sexual mode of transmission: Secondary | ICD-10-CM | POA: Diagnosis not present

## 2018-01-05 DIAGNOSIS — R809 Proteinuria, unspecified: Secondary | ICD-10-CM | POA: Diagnosis not present

## 2018-01-07 DIAGNOSIS — Z8673 Personal history of transient ischemic attack (TIA), and cerebral infarction without residual deficits: Secondary | ICD-10-CM | POA: Diagnosis not present

## 2018-01-07 DIAGNOSIS — Z3A34 34 weeks gestation of pregnancy: Secondary | ICD-10-CM | POA: Diagnosis not present

## 2018-01-07 DIAGNOSIS — Z7982 Long term (current) use of aspirin: Secondary | ICD-10-CM | POA: Diagnosis not present

## 2018-01-07 DIAGNOSIS — Z79899 Other long term (current) drug therapy: Secondary | ICD-10-CM | POA: Diagnosis not present

## 2018-01-07 DIAGNOSIS — O24414 Gestational diabetes mellitus in pregnancy, insulin controlled: Secondary | ICD-10-CM | POA: Diagnosis not present

## 2018-01-07 DIAGNOSIS — O99613 Diseases of the digestive system complicating pregnancy, third trimester: Secondary | ICD-10-CM | POA: Diagnosis not present

## 2018-01-07 DIAGNOSIS — O99343 Other mental disorders complicating pregnancy, third trimester: Secondary | ICD-10-CM | POA: Diagnosis not present

## 2018-01-07 DIAGNOSIS — K219 Gastro-esophageal reflux disease without esophagitis: Secondary | ICD-10-CM | POA: Diagnosis not present

## 2018-01-07 DIAGNOSIS — Z794 Long term (current) use of insulin: Secondary | ICD-10-CM | POA: Diagnosis not present

## 2018-01-07 DIAGNOSIS — O368139 Decreased fetal movements, third trimester, other fetus: Secondary | ICD-10-CM | POA: Diagnosis not present

## 2018-01-07 DIAGNOSIS — F410 Panic disorder [episodic paroxysmal anxiety] without agoraphobia: Secondary | ICD-10-CM | POA: Diagnosis not present

## 2018-01-07 DIAGNOSIS — O368130 Decreased fetal movements, third trimester, not applicable or unspecified: Secondary | ICD-10-CM | POA: Diagnosis not present

## 2018-01-08 DIAGNOSIS — Z8759 Personal history of other complications of pregnancy, childbirth and the puerperium: Secondary | ICD-10-CM | POA: Diagnosis not present

## 2018-01-08 DIAGNOSIS — R82998 Other abnormal findings in urine: Secondary | ICD-10-CM | POA: Diagnosis not present

## 2018-01-08 DIAGNOSIS — R809 Proteinuria, unspecified: Secondary | ICD-10-CM | POA: Diagnosis not present

## 2018-01-08 DIAGNOSIS — O24414 Gestational diabetes mellitus in pregnancy, insulin controlled: Secondary | ICD-10-CM | POA: Diagnosis not present

## 2018-01-08 DIAGNOSIS — O09893 Supervision of other high risk pregnancies, third trimester: Secondary | ICD-10-CM | POA: Diagnosis not present

## 2018-01-08 DIAGNOSIS — R791 Abnormal coagulation profile: Secondary | ICD-10-CM | POA: Diagnosis not present

## 2018-01-08 DIAGNOSIS — O99113 Other diseases of the blood and blood-forming organs and certain disorders involving the immune mechanism complicating pregnancy, third trimester: Secondary | ICD-10-CM | POA: Diagnosis not present

## 2018-01-08 DIAGNOSIS — O99213 Obesity complicating pregnancy, third trimester: Secondary | ICD-10-CM | POA: Diagnosis not present

## 2018-01-08 DIAGNOSIS — Z3A35 35 weeks gestation of pregnancy: Secondary | ICD-10-CM | POA: Diagnosis not present

## 2018-01-08 DIAGNOSIS — O368930 Maternal care for other specified fetal problems, third trimester, not applicable or unspecified: Secondary | ICD-10-CM | POA: Diagnosis not present

## 2018-01-12 DIAGNOSIS — Z8673 Personal history of transient ischemic attack (TIA), and cerebral infarction without residual deficits: Secondary | ICD-10-CM | POA: Diagnosis not present

## 2018-01-12 DIAGNOSIS — O24414 Gestational diabetes mellitus in pregnancy, insulin controlled: Secondary | ICD-10-CM | POA: Diagnosis not present

## 2018-01-12 DIAGNOSIS — R809 Proteinuria, unspecified: Secondary | ICD-10-CM | POA: Diagnosis not present

## 2018-01-12 DIAGNOSIS — O99113 Other diseases of the blood and blood-forming organs and certain disorders involving the immune mechanism complicating pregnancy, third trimester: Secondary | ICD-10-CM | POA: Diagnosis not present

## 2018-01-12 DIAGNOSIS — Z8759 Personal history of other complications of pregnancy, childbirth and the puerperium: Secondary | ICD-10-CM | POA: Diagnosis not present

## 2018-01-12 DIAGNOSIS — R82998 Other abnormal findings in urine: Secondary | ICD-10-CM | POA: Diagnosis not present

## 2018-01-12 DIAGNOSIS — O99213 Obesity complicating pregnancy, third trimester: Secondary | ICD-10-CM | POA: Diagnosis not present

## 2018-01-12 DIAGNOSIS — Z3A35 35 weeks gestation of pregnancy: Secondary | ICD-10-CM | POA: Diagnosis not present

## 2018-01-12 DIAGNOSIS — O09893 Supervision of other high risk pregnancies, third trimester: Secondary | ICD-10-CM | POA: Diagnosis not present

## 2018-01-15 DIAGNOSIS — Z3A36 36 weeks gestation of pregnancy: Secondary | ICD-10-CM | POA: Diagnosis not present

## 2018-01-15 DIAGNOSIS — Z8673 Personal history of transient ischemic attack (TIA), and cerebral infarction without residual deficits: Secondary | ICD-10-CM | POA: Diagnosis not present

## 2018-01-15 DIAGNOSIS — O24419 Gestational diabetes mellitus in pregnancy, unspecified control: Secondary | ICD-10-CM | POA: Diagnosis not present

## 2018-01-19 DIAGNOSIS — Z3A36 36 weeks gestation of pregnancy: Secondary | ICD-10-CM | POA: Diagnosis not present

## 2018-01-19 DIAGNOSIS — Z8673 Personal history of transient ischemic attack (TIA), and cerebral infarction without residual deficits: Secondary | ICD-10-CM | POA: Diagnosis not present

## 2018-01-19 DIAGNOSIS — O24419 Gestational diabetes mellitus in pregnancy, unspecified control: Secondary | ICD-10-CM | POA: Diagnosis not present

## 2018-01-22 DIAGNOSIS — O3663X0 Maternal care for excessive fetal growth, third trimester, not applicable or unspecified: Secondary | ICD-10-CM | POA: Diagnosis not present

## 2018-01-22 DIAGNOSIS — O09893 Supervision of other high risk pregnancies, third trimester: Secondary | ICD-10-CM | POA: Diagnosis not present

## 2018-01-22 DIAGNOSIS — R809 Proteinuria, unspecified: Secondary | ICD-10-CM | POA: Diagnosis not present

## 2018-01-22 DIAGNOSIS — O99213 Obesity complicating pregnancy, third trimester: Secondary | ICD-10-CM | POA: Diagnosis not present

## 2018-01-22 DIAGNOSIS — Z8759 Personal history of other complications of pregnancy, childbirth and the puerperium: Secondary | ICD-10-CM | POA: Diagnosis not present

## 2018-01-22 DIAGNOSIS — Z8673 Personal history of transient ischemic attack (TIA), and cerebral infarction without residual deficits: Secondary | ICD-10-CM | POA: Diagnosis not present

## 2018-01-22 DIAGNOSIS — O24414 Gestational diabetes mellitus in pregnancy, insulin controlled: Secondary | ICD-10-CM | POA: Diagnosis not present

## 2018-01-22 DIAGNOSIS — R82998 Other abnormal findings in urine: Secondary | ICD-10-CM | POA: Diagnosis not present

## 2018-01-22 DIAGNOSIS — R822 Biliuria: Secondary | ICD-10-CM | POA: Diagnosis not present

## 2018-01-22 DIAGNOSIS — Z3A37 37 weeks gestation of pregnancy: Secondary | ICD-10-CM | POA: Diagnosis not present

## 2018-01-22 DIAGNOSIS — O99113 Other diseases of the blood and blood-forming organs and certain disorders involving the immune mechanism complicating pregnancy, third trimester: Secondary | ICD-10-CM | POA: Diagnosis not present

## 2018-01-26 DIAGNOSIS — Z8673 Personal history of transient ischemic attack (TIA), and cerebral infarction without residual deficits: Secondary | ICD-10-CM | POA: Diagnosis not present

## 2018-01-26 DIAGNOSIS — Z3A37 37 weeks gestation of pregnancy: Secondary | ICD-10-CM | POA: Diagnosis not present

## 2018-01-26 DIAGNOSIS — O24419 Gestational diabetes mellitus in pregnancy, unspecified control: Secondary | ICD-10-CM | POA: Diagnosis not present

## 2018-01-26 DIAGNOSIS — O403XX0 Polyhydramnios, third trimester, not applicable or unspecified: Secondary | ICD-10-CM | POA: Diagnosis not present

## 2018-01-28 DIAGNOSIS — Z3A37 37 weeks gestation of pregnancy: Secondary | ICD-10-CM | POA: Diagnosis not present

## 2018-01-28 DIAGNOSIS — O321XX0 Maternal care for breech presentation, not applicable or unspecified: Secondary | ICD-10-CM | POA: Diagnosis not present

## 2018-01-28 DIAGNOSIS — Z3A36 36 weeks gestation of pregnancy: Secondary | ICD-10-CM | POA: Diagnosis not present

## 2018-01-29 DIAGNOSIS — G43909 Migraine, unspecified, not intractable, without status migrainosus: Secondary | ICD-10-CM | POA: Diagnosis not present

## 2018-01-29 DIAGNOSIS — O2343 Unspecified infection of urinary tract in pregnancy, third trimester: Secondary | ICD-10-CM | POA: Diagnosis not present

## 2018-01-29 DIAGNOSIS — O403XX0 Polyhydramnios, third trimester, not applicable or unspecified: Secondary | ICD-10-CM | POA: Diagnosis not present

## 2018-01-29 DIAGNOSIS — O43193 Other malformation of placenta, third trimester: Secondary | ICD-10-CM | POA: Diagnosis not present

## 2018-01-29 DIAGNOSIS — O24429 Gestational diabetes mellitus in childbirth, unspecified control: Secondary | ICD-10-CM | POA: Diagnosis not present

## 2018-01-29 DIAGNOSIS — O99354 Diseases of the nervous system complicating childbirth: Secondary | ICD-10-CM | POA: Diagnosis not present

## 2018-01-29 DIAGNOSIS — O99344 Other mental disorders complicating childbirth: Secondary | ICD-10-CM | POA: Diagnosis not present

## 2018-01-29 DIAGNOSIS — F419 Anxiety disorder, unspecified: Secondary | ICD-10-CM | POA: Diagnosis not present

## 2018-01-29 DIAGNOSIS — O9962 Diseases of the digestive system complicating childbirth: Secondary | ICD-10-CM | POA: Diagnosis not present

## 2018-01-29 DIAGNOSIS — Z3A37 37 weeks gestation of pregnancy: Secondary | ICD-10-CM | POA: Diagnosis not present

## 2018-01-29 DIAGNOSIS — O43813 Placental infarction, third trimester: Secondary | ICD-10-CM | POA: Diagnosis not present

## 2018-01-29 DIAGNOSIS — Z3A38 38 weeks gestation of pregnancy: Secondary | ICD-10-CM | POA: Diagnosis not present

## 2018-01-29 DIAGNOSIS — O24424 Gestational diabetes mellitus in childbirth, insulin controlled: Secondary | ICD-10-CM | POA: Diagnosis not present

## 2018-01-29 DIAGNOSIS — O43893 Other placental disorders, third trimester: Secondary | ICD-10-CM | POA: Diagnosis not present

## 2018-02-16 DIAGNOSIS — G43909 Migraine, unspecified, not intractable, without status migrainosus: Secondary | ICD-10-CM | POA: Diagnosis not present

## 2018-02-16 DIAGNOSIS — F419 Anxiety disorder, unspecified: Secondary | ICD-10-CM | POA: Diagnosis not present

## 2018-02-16 DIAGNOSIS — Z23 Encounter for immunization: Secondary | ICD-10-CM | POA: Diagnosis not present

## 2018-04-01 DIAGNOSIS — Z30012 Encounter for prescription of emergency contraception: Secondary | ICD-10-CM | POA: Diagnosis not present

## 2018-04-01 DIAGNOSIS — Z130 Encounter for screening for diseases of the blood and blood-forming organs and certain disorders involving the immune mechanism: Secondary | ICD-10-CM | POA: Diagnosis not present

## 2018-04-01 DIAGNOSIS — Z8673 Personal history of transient ischemic attack (TIA), and cerebral infarction without residual deficits: Secondary | ICD-10-CM | POA: Diagnosis not present

## 2018-04-01 DIAGNOSIS — Z3202 Encounter for pregnancy test, result negative: Secondary | ICD-10-CM | POA: Diagnosis not present

## 2018-04-01 DIAGNOSIS — Z1332 Encounter for screening for maternal depression: Secondary | ICD-10-CM | POA: Diagnosis not present

## 2018-05-11 DIAGNOSIS — D6859 Other primary thrombophilia: Secondary | ICD-10-CM | POA: Diagnosis not present

## 2018-05-11 DIAGNOSIS — G43909 Migraine, unspecified, not intractable, without status migrainosus: Secondary | ICD-10-CM | POA: Diagnosis not present

## 2018-05-11 DIAGNOSIS — E785 Hyperlipidemia, unspecified: Secondary | ICD-10-CM | POA: Diagnosis not present

## 2018-05-11 DIAGNOSIS — K219 Gastro-esophageal reflux disease without esophagitis: Secondary | ICD-10-CM | POA: Diagnosis not present

## 2018-05-11 DIAGNOSIS — Z13220 Encounter for screening for lipoid disorders: Secondary | ICD-10-CM | POA: Diagnosis not present

## 2018-05-11 DIAGNOSIS — F419 Anxiety disorder, unspecified: Secondary | ICD-10-CM | POA: Diagnosis not present

## 2018-09-19 ENCOUNTER — Telehealth: Payer: Self-pay | Admitting: Physician Assistant

## 2018-09-19 DIAGNOSIS — R51 Headache: Principal | ICD-10-CM

## 2018-09-19 DIAGNOSIS — R519 Headache, unspecified: Secondary | ICD-10-CM

## 2018-09-19 NOTE — Progress Notes (Signed)
Based on what you shared with me, I feel your condition warrants further evaluation and I recommend that you be seen for a face to face office visit.     NOTE: If you entered your credit card information for this eVisit, you will not be charged. You may see a "hold" on your card for the $35 but that hold will drop off and you will not have a charge processed.  If you are having a true medical emergency please call 911.  If you need an urgent face to face visit, [place] has four urgent care centers for your convenience.    PLEASE NOTE: THE INSTACARE LOCATIONS AND URGENT CARE CLINICS DO NOT HAVE THE TESTING FOR CORONAVIRUS COVID19 AVAILABLE.  IF YOU FEEL YOU NEED THIS TEST YOU MUST GO TO A TRIAGE LOCATION AT ONE OF THE HOSPITAL EMERGENCY DEPARTMENTS ?  [email or web address] to reserve your spot online an avoid wait times  [facility] [address] [place], [place] [address] Modified hours of operation: Monday-Friday, 10 AM to 6 PM  Saturday & Sunday 10 AM to 4 PM *Across the street from [employer]  [employer] (New Address!) [address] [place], [place] [address] *Just off [address] across the road from [place]* Modified hours of operation: Monday-Friday, 10 AM to 5 PM  Closed Saturday & Sunday   The following sites will take your insurance:  [facility]  [phone] Get Driving Directions Find a Provider at this Location  [address] [place], [place] [address] 10 am to 8 pm Monday-Friday 12 pm to 8 pm [facility] at [facility]  [phone] Get Driving Directions Find a Provider at this Location  [address] [place], [place] [address] 8 am to 8 pm Monday-Friday 9 am to 6 pm Saturday 11 am to 6 pm Sunday   [facility] at [facility]  [phone] Get Driving Directions  [address] [place], [place] [address] 8 am to 8 pm Monday-Friday 8 am to 4 pm Saturday-Sunday   Your e-visit answers were reviewed by a board certified advanced clinical practitioner to complete your personal care plan.  Thank you for using e-Visits.  ===View-only below this line===   ----- Message -----    From: Georgiana Shore    Sent: 09/19/2018  8:33 AM EDT      To: E-Visit Mailing List Subject: E-Visit Submission: Sinus Problems  E-Visit Submission: Sinus Problems --------------------------------  Question: Which of the following have you been experiencing? Answer:   Congested nose            Pain around the nose and face            Headache            Ear pain            Neck pain            Cough  Question: Have these symptoms significantly worsened over the last two to three days? Answer:   Yes  Question: Have you had any of the following? Answer:   None of the above  Question: How long have you been having these symptoms? Answer:   7 or more days  Question: Do you have a fever? Answer:   No, I do not have a fever  Question: Do you smoke? Answer:   No  Question: Have you ever smoked?  Answer:   I have never smoked  Question: Do you have any chronic illnesses, such as diabetes, heart disease, kidney disease, or lung disease, or any illness that would weaken your body's ability to fight infection? Answer:   I am not sure  Question: Please enter details of your other illness. Answer:   Having really bad headaches . I have had strokes in the past with no other symptoms.  Over the last few days my head started to hurt frequently.  It particularly hurt in the spots where my past strokes developed.  Question: When you blow your nose, what color is the mucus? Answer:   Mostly clear  Question: Have you experienced similar problems in the past? Answer:   Yes  Question: What treatments have worked in the past?  Answer:   I have yet to find an allergy medicine that  works best for me. I do you Benadryl sometimes.  Question: What treatment(s) in the past have been unsuccessful? Answer:   Allegra, Claritin  Question: Is this illness similar to previous illnesses you have had?  How is it the same?  How is it different? Answer:   Yes, the headaches that I am currently having are mimicking the previous headaches that led up to it being  strokes.  Question: Have you recently been hospitalized? Answer:   No  Question: What medications are you currently taking for these symptoms? Answer:   Decongestants            Pain medicine            Nose spray  Question: Please enter the names of any medications you are taking, or any other treatments you are trying. Answer:   Benadryl, Tylenol or Ibuprofen.  Question: Are you pregnant? Answer:   I am confident that I am not pregnant  Question: Are you breastfeeding? Answer:   No  Question: Please list your medication allergies that you may have ? (If 'none' , please list as 'none') Answer:   None  Question: Please list any additional comments  Answer:   I am also 7 months postpartum. I had gestational diabetes and was never tested again after the pregnancy. Also I was on blood thinners. I was taken off.

## 2019-01-31 ENCOUNTER — Telehealth: Payer: Self-pay | Admitting: Nurse Practitioner

## 2019-01-31 DIAGNOSIS — Z98818 Other dental procedure status: Secondary | ICD-10-CM

## 2019-01-31 NOTE — Progress Notes (Signed)
Based on what you shared with me it looks like you have pain,that should be evaluated in a face to face office visit. I am sorry you are in so much pain but we are unable to do pain medication in an evisit. You will need to contact your oral surgeon for further advice. Something that may help is to put water in zip lock baggy with a little rubbing alcohol in it and freeze it. Place it on your face for at least 1 hour every couple of hours. You will b surprised how much it helps. Motrin OTVC every 6 hours may also help.   NOTE: If you entered your credit card information for this eVisit, you will not be charged. You may see a "hold" on your card for the $30 but that hold will drop off and you will not have a charge processed.  If you are having a true medical emergency please call 911.  If you need an urgent face to face visit, [place] has four urgent care centers for your convenience.  If you need care fast and have a high deductible or no insurance consider:   [email or web address] to reserve your spot online an avoid wait times  [facility] [address] [place], [place] [address] 8 am to 8 pm Monday-Friday 10 am to 4 pm Saturday-Sunday *Across the street from [employer]  , [address] 8 am to 5 pm Monday-Friday * In the [facility] on the [facility]   The following sites will take your  insurance:  . [facility] Health Urgent [place] a Provider at this Location  [address] [place], [place] [address] . 10 am to 8 pm Monday-Friday . 12 pm to 8 pm Saturday-Sunday   . [facility] Urgent Care at [place] a Provider at this Location  [address] [place], [place], [place] [address] . 8 am to 8 pm Monday-Friday . 9 am to 6 pm Saturday . 11 am to 6 pm Sunday   . [facility]  Care [place] Get Driving Directions  [address] [place], [place] [address] . 8 am to 8 pm Monday-Friday . 8 am to 4 pm Saturday-Sunday   Your e-visit answers were reviewed by a board certified advanced clinical practitioner to complete your personal care plan.

## 2019-04-24 ENCOUNTER — Telehealth: Payer: Self-pay | Admitting: Family

## 2019-04-24 DIAGNOSIS — Z8673 Personal history of transient ischemic attack (TIA), and cerebral infarction without residual deficits: Secondary | ICD-10-CM

## 2019-04-24 DIAGNOSIS — R519 Headache, unspecified: Secondary | ICD-10-CM

## 2019-04-24 NOTE — Progress Notes (Signed)
Based on what you shared with me, I feel your condition warrants further evaluation and I recommend that you be seen for a face to face office visit.   Given your current symptoms and history of TIA, you need to be seen face to face today.    NOTE: If you entered your credit card information for this eVisit, you will not be charged. You may see a "hold" on your card for the $35 but that hold will drop off and you will not have a charge processed.   If you are having a true medical emergency please call 911.      For an urgent face to face visit, [place] has five urgent care centers for your convenience:      NEW:  [facility] [place] Get Driving Directions [phone] [place], [place] [address] . 10 am - 6pm Monday - Friday    [place] [facility]) Get Driving Directions [phone] [address] [place], [place] [address] . 10 am to 8 pm Monday-Friday . 12 pm to 8 pm [facility] at [facility] Get Driving Directions [phone] [place], [place], [place] [address] . 8 am to 8 pm Monday-Friday . 9 am to 6 pm Saturday . 11 am to 6 pm Sunday     [facility] Urgent Care at [facility] Get Driving Directions  [phone] [address] [place], [place] [address] . 8 am to 8 pm Monday-Friday . 8 am to 4 pm [facility] at [place] Get Driving Directions [phone] [address] [place], [place] [address] . 12 pm to 6 pm Monday-Friday      Your e-visit answers were reviewed by a board certified advanced clinical practitioner to complete your personal care plan.  Thank you for using e-Visits.

## 2019-05-21 ENCOUNTER — Telehealth: Payer: Self-pay | Admitting: Nurse Practitioner

## 2019-05-21 DIAGNOSIS — Z3201 Encounter for pregnancy test, result positive: Secondary | ICD-10-CM

## 2019-05-21 DIAGNOSIS — R112 Nausea with vomiting, unspecified: Secondary | ICD-10-CM

## 2019-05-21 NOTE — Progress Notes (Signed)
We are sorry that you are not feeling well. Here is how we plan to help!  Based on what you have shared with me it looks like you have a Virus that is irritating your GI tract.  Vomiting is the forceful emptying of a portion of the stomach's content through the mouth.  Although nausea and vomiting can make you feel miserable, it's important to remember that these are not diseases, but rather symptoms of an underlying illness.  When we treat short term symptoms, we always caution that any symptoms that persist should be fully evaluated in a medical office. This happens frequently in your first trimester. We try real hard not to give medication in the first 3 months of pregnancy because of possible harm to the baby. The best and safest treatment at this point is vitamin B6 50mg  2x a day and a dose of over the counter unisom at bedtime with second dose of b6.   You will also need to make an appointment to see a OB doctor as soon as possible   HOME CARE:  Drink clear liquids.  This is very important! Dehydration (the lack of fluid) can lead to a serious complication.  Start off with 1 tablespoon every 5 minutes for 8 hours.  You may begin eating bland foods after 8 hours without vomiting.  Start with saltine crackers, white bread, rice, mashed potatoes, applesauce.  After 48 hours on a bland diet, you may resume a normal diet.  Try to go to sleep.  Sleep often empties the stomach and relieves the need to vomit.  GET HELP RIGHT AWAY IF:   Your symptoms do not improve or worsen within 2 days after treatment.  You have a fever for over 3 days.  You cannot keep down fluids after trying the medication.  MAKE SURE YOU:   Understand these instructions.  Will watch your condition.  Will get help right away if you are not doing well or get worse.   Thank you for choosing an e-visit. Your e-visit answers were reviewed by a board certified advanced clinical practitioner to complete your  personal care plan. Depending upon the condition, your plan could have included both over the counter or prescription medications. Please review your pharmacy choice. Be sure that the pharmacy you have chosen is open so that you can pick up your prescription now.  If there is a problem you may message your provider in [place] to have the prescription routed to another pharmacy. Your safety is important to [place]. If you have drug allergies check your prescription carefully.  For the next 24 hours, you can use MyChart to ask questions about today's visit, request a non-urgent call back, or ask for a work or school excuse from your e-visit provider. You will get an e-mail in the next two days asking about your experience. I hope that your e-visit has been valuable and will speed your recovery.  5-10 minutes spent reviewing and documenting in chart.

## 2019-05-27 NOTE — L&D Delivery Note (Signed)
OB/GYN Faculty Practice Delivery Note  Angel [name] is a 29 y.o. G3P3003 s/p NSVD at [redacted]w[redacted]d. She was admitted for IOL for uncontrolled GDMA2.   ROM: 4h 34m with clear fluid GBS Status: Negative/-- (07/15 1056) Maximum Maternal Temperature: 98.8 F    Labor Progress: . Patient arrived at 2 cm dilation and was induced with FB and misoprostol x1. After FB came out she was started on pitocin followed by AROM. She progressed to complete after approximately 10 hours and had uncomplicated NSVD.  Delivery Date/Time: 12/24/2019 at 2146 Delivery: Called to room and patient was complete and pushing. Head delivered in OA position. Nuchal/body cord x1 present. Shoulder and body delivered in usual fashion. Infant with spontaneous cry, placed on mother's abdomen, dried and stimulated. Cord clamped x 2 after 1-minute delay, and cut by FOB. Cord blood drawn. Placenta delivered spontaneously with gentle cord traction. Fundus firm with massage and Pitocin. Labia, perineum, vagina, and cervix inspected with bilateral periurethral lacerations.   After delivery of placenta a post-placental Liletta IUD was placed, see separate note for details. After this the R sided periurethral laceration was repaired.   Placenta: 3v intact to L&D Complications: none Lacerations: bilateral periurethral lacerations, R side repaired with 4-0 vicryl EBL: 50 cc Analgesia: epidural   Infant: Female infant  APGAR (1 MIN): 9   APGAR (5 MINS): 9    Weight: 2841 grams  Zack Seal, MD/MP[facility] OB/GYN Fellow, Faculty Practice

## 2019-07-12 ENCOUNTER — Ambulatory Visit (INDEPENDENT_AMBULATORY_CARE_PROVIDER_SITE_OTHER): Payer: Medicaid Other

## 2019-07-12 ENCOUNTER — Other Ambulatory Visit: Payer: Self-pay

## 2019-07-12 DIAGNOSIS — Z3201 Encounter for pregnancy test, result positive: Secondary | ICD-10-CM

## 2019-07-12 DIAGNOSIS — O09899 Supervision of other high risk pregnancies, unspecified trimester: Secondary | ICD-10-CM | POA: Insufficient documentation

## 2019-07-12 DIAGNOSIS — Z3480 Encounter for supervision of other normal pregnancy, unspecified trimester: Secondary | ICD-10-CM

## 2019-07-12 LAB — POCT URINE PREGNANCY: Preg Test, Ur: POSITIVE — AB

## 2019-07-12 MED ORDER — BLOOD PRESSURE KIT DEVI: 1.0000 | 0 refills | Status: DC | PRN

## 2019-07-12 NOTE — Progress Notes (Signed)
..    In Person-New OB Intake  Location: [facility] Patient: Angel [name]    History of Present Illness: PRENATAL INTAKE SUMMARY  Angel [name] presents today New OB Nurse Interview.  OB History    Gravida  3   Para  2   Term  2   Preterm      AB      Living  2     SAB      TAB      Ectopic      Multiple      Live Births  2          I have reviewed the patient's medical, obstetrical, social, and family histories, medications, and available lab results.  SUBJECTIVE She has no unusual complaints   Observations/Objective: Initial nurse interview for history/labs (New OB)  EDD: 01-06-20 GA: [redacted]w[redacted]d GP: G3P2  GENERAL APPEARANCE: alert, well appearing  Assessment and Plan: Normal pregnancy Pt advised that she has a history of 2 strokes in 2015. During her last pregnancy she was on lovenox injections. Pt states that she does not see a specialist and does not take medication outside of the last pregnancy. Pt denies any complications today. FOB is involved. BP cuff sent to [facility] today.  Follow Up Instructions:   I discussed the assessment and treatment plan with the patient. The patient was provided an opportunity to ask questions and all were answered. The patient agreed with the plan and demonstrated an understanding of the instructions.   The patient was advised to call back or seek an in-person evaluation if the symptoms worsen or if the condition fails to improve as anticipated.  I provided 15 minutes of face-to-face time during this encounter.   Katrina Stack, RN

## 2019-07-13 DIAGNOSIS — Z3400 Encounter for supervision of normal first pregnancy, unspecified trimester: Secondary | ICD-10-CM | POA: Diagnosis not present

## 2019-07-22 ENCOUNTER — Encounter: Payer: Medicaid Other | Admitting: Obstetrics

## 2019-08-12 ENCOUNTER — Ambulatory Visit (INDEPENDENT_AMBULATORY_CARE_PROVIDER_SITE_OTHER): Payer: Medicaid Other | Admitting: Obstetrics

## 2019-08-12 ENCOUNTER — Other Ambulatory Visit: Payer: Self-pay

## 2019-08-12 ENCOUNTER — Encounter: Payer: Self-pay | Admitting: Obstetrics

## 2019-08-12 ENCOUNTER — Other Ambulatory Visit (HOSPITAL_COMMUNITY)
Admission: RE | Admit: 2019-08-12 | Discharge: 2019-08-12 | Disposition: A | Discharge status: Home / Self Care | Payer: Medicaid Other | Source: Ambulatory Visit | Attending: Obstetrics | Admitting: Obstetrics

## 2019-08-12 VITALS — BP 124/79 | HR 88 | Wt 248.1 lb

## 2019-08-12 DIAGNOSIS — Z131 Encounter for screening for diabetes mellitus: Secondary | ICD-10-CM | POA: Diagnosis not present

## 2019-08-12 DIAGNOSIS — Z8632 Personal history of gestational diabetes: Secondary | ICD-10-CM

## 2019-08-12 DIAGNOSIS — D6859 Other primary thrombophilia: Secondary | ICD-10-CM

## 2019-08-12 DIAGNOSIS — O99413 Diseases of the circulatory system complicating pregnancy, third trimester: Secondary | ICD-10-CM

## 2019-08-12 DIAGNOSIS — Z8759 Personal history of other complications of pregnancy, childbirth and the puerperium: Secondary | ICD-10-CM

## 2019-08-12 DIAGNOSIS — O0990 Supervision of high risk pregnancy, unspecified, unspecified trimester: Secondary | ICD-10-CM | POA: Insufficient documentation

## 2019-08-12 DIAGNOSIS — O09293 Supervision of pregnancy with other poor reproductive or obstetric history, third trimester: Secondary | ICD-10-CM

## 2019-08-12 DIAGNOSIS — O99210 Obesity complicating pregnancy, unspecified trimester: Secondary | ICD-10-CM

## 2019-08-12 DIAGNOSIS — J302 Other seasonal allergic rhinitis: Secondary | ICD-10-CM

## 2019-08-12 DIAGNOSIS — O09892 Supervision of other high risk pregnancies, second trimester: Secondary | ICD-10-CM | POA: Diagnosis not present

## 2019-08-12 DIAGNOSIS — Z3A19 19 weeks gestation of pregnancy: Secondary | ICD-10-CM

## 2019-08-12 DIAGNOSIS — O99513 Diseases of the respiratory system complicating pregnancy, third trimester: Secondary | ICD-10-CM

## 2019-08-12 DIAGNOSIS — G43109 Migraine with aura, not intractable, without status migrainosus: Secondary | ICD-10-CM

## 2019-08-12 DIAGNOSIS — Z3481 Encounter for supervision of other normal pregnancy, first trimester: Secondary | ICD-10-CM

## 2019-08-12 DIAGNOSIS — I639 Cerebral infarction, unspecified: Secondary | ICD-10-CM

## 2019-08-12 MED ORDER — LORATADINE 10 MG PO TABS: 10.0000 mg | ORAL_TABLET | Freq: Every day | ORAL | 11 refills | Status: DC

## 2019-08-12 MED ORDER — VITAFOL ULTRA 29-0.6-0.4-200 MG PO CAPS: 1.0000 | ORAL_CAPSULE | Freq: Every day | ORAL | 4 refills | Status: DC

## 2019-08-12 MED ORDER — ASPIRIN 81 MG PO CHEW: 81.0000 mg | CHEWABLE_TABLET | Freq: Every day | ORAL | 6 refills | Status: DC

## 2019-08-12 MED ORDER — ENOXAPARIN SODIUM 40 MG/0.4ML ~~LOC~~ SOLN: 40.0000 mg | SUBCUTANEOUS | 6 refills | Status: DC

## 2019-08-12 NOTE — Progress Notes (Signed)
NOB in office, reports fetal movement, denies pain. Pt and FOB are married, unplanned pregnancy. Pt had U/S at [employer], and found out gender is a boy. NOB intake done on 07-12-19

## 2019-08-12 NOTE — Progress Notes (Signed)
Subjective:  Angel [name] is a 29 y.o. G3P2002 at [redacted]w[redacted]d being seen today for ongoing prenatal care.  She is currently monitored for the following issues for this high-risk pregnancy and has Migraine; Morbid obesity (HCC); Stroke [facility]); Acute ischemic stroke (HCC); Anemia; Left arm numbness; Hyperlipidemia; Protein S deficiency (HCC); and Supervision of other high risk pregnancy, antepartum on their problem list.  Patient reports no complaints.  Contractions: Not present. Vag. Bleeding: None.  Movement: Present. Denies leaking of fluid.   The following portions of the patient's history were reviewed and updated as appropriate: allergies, current medications, past family history, past medical history, past social history, past surgical history and problem list. Problem list updated.  Objective:   Vitals:   08/12/19 0850  BP: 124/79  Pulse: 88  Weight: 248 lb 1.6 oz (112.5 kg)    Fetal Status:     Movement: Present     General:  Alert, oriented and cooperative. Patient is in no acute distress.  Skin: Skin is warm and dry. No rash noted.   Cardiovascular: Normal heart rate noted  Respiratory: Normal respiratory effort, no problems with respiration noted  Abdomen: Soft, gravid, appropriate for gestational age. Pain/Pressure: Absent     Pelvic:  Cervical exam performed        Extremities: Normal range of motion.  Edema: None  Mental Status: Normal mood and affect. Normal behavior. Normal judgment and thought content.   Urinalysis:      Assessment and Plan:  Pregnancy: G3P2002 at [redacted]w[redacted]d  1. Supervision of high risk pregnancy, antepartum Rx: - Cytology - PAP( [place]) - Culture, OB Urine - Genetic Screening - Obstetric Panel, Including HIV - Cervicovaginal ancillary only( [place]) - Babyscripts Schedule Optimization - AFP, Serum, Open Spina Bifida - Prenat-Fe Poly-Methfol-FA-DHA (VITAFOL ULTRA) 29-0.6-0.4-200 MG CAPS; Take 1 capsule by mouth daily before breakfast.   Dispense: 90 capsule; Refill: 4 - Ferritin - Comprehensive metabolic panel  2. H/O severe pre-eclampsia Rx: - aspirin 81 MG chewable tablet; Chew 1 tablet (81 mg total) by mouth daily.  Dispense: 30 tablet; Refill: 6  3. Acute ischemic stroke (HCC) Rx: - enoxaparin (LOVENOX) 40 MG/0.4ML injection; Inject 0.4 mLs (40 mg total) into the skin daily.  Dispense: 12 mL; Refill: 6 - [place] MFM OB DETAIL +14 WK; Future  4. Protein S deficiency (HCC) Rx: - enoxaparin (LOVENOX) 40 MG/0.4ML injection; Inject 0.4 mLs (40 mg total) into the skin daily.  Dispense: 12 mL; Refill: 6 - AMB MFM GENETICS REFERRAL  5. Migraine with aura and without status migrainosus, not intractable - clinically stable  6. History of insulin controlled gestational diabetes mellitus (GDM) - early 2 hour GTT ordered - HgbA1C ordered  7. Obesity affecting pregnancy, antepartum  8. Screening for diabetes mellitus Rx: - Hemoglobin A1c  9. Seasonal allergies Rx: - loratadine (CLARITIN) 10 MG tablet; Take 1 tablet (10 mg total) by mouth daily.  Dispense: 30 tablet; Refill: 11   Preterm labor symptoms and general obstetric precautions including but not limited to vaginal bleeding, contractions, leaking of fluid and fetal movement were reviewed in detail with the patient. Please refer to After Visit Summary for other counseling recommendations.  Return in about 2 weeks (around 08/26/2019) for [facility], 2 hour OGTT.   Brock Bad, MD  08/12/2019

## 2019-08-14 LAB — FERRITIN: Ferritin: 23 ng/mL (ref 15–150)

## 2019-08-14 LAB — CULTURE, OB URINE

## 2019-08-14 LAB — AFP, SERUM, OPEN SPINA BIFIDA
AFP MoM: 0.78
AFP Value: 31.7 ng/mL
Gest. Age on Collection Date: 19 weeks
Maternal Age At EDD: 30.2 yr
OSBR Risk 1 IN: 10000
Test Results:: NEGATIVE
Weight: 248 [lb_av]

## 2019-08-14 LAB — HEMOGLOBIN A1C
Est. average glucose Bld gHb Est-mCnc: 111 mg/dL
Hgb A1c MFr Bld: 5.5 % (ref 4.8–5.6)

## 2019-08-14 LAB — OBSTETRIC PANEL, INCLUDING HIV
Antibody Screen: NEGATIVE
Basophils Absolute: 0 10*3/uL (ref 0.0–0.2)
Basos: 0 %
EOS (ABSOLUTE): 0 10*3/uL (ref 0.0–0.4)
Eos: 0 %
HIV Screen 4th Generation wRfx: NONREACTIVE
Hematocrit: 36.4 % (ref 34.0–46.6)
Hemoglobin: 12.4 g/dL (ref 11.1–15.9)
Hepatitis B Surface Ag: NEGATIVE
Immature Grans (Abs): 0.1 10*3/uL (ref 0.0–0.1)
Immature Granulocytes: 1 %
Lymphocytes Absolute: 1.9 10*3/uL (ref 0.7–3.1)
Lymphs: 22 %
MCH: 29.6 pg (ref 26.6–33.0)
MCHC: 34.1 g/dL (ref 31.5–35.7)
MCV: 87 fL (ref 79–97)
Monocytes Absolute: 0.4 10*3/uL (ref 0.1–0.9)
Monocytes: 5 %
Neutrophils Absolute: 6.2 10*3/uL (ref 1.4–7.0)
Neutrophils: 72 %
Platelets: 305 10*3/uL (ref 150–450)
RBC: 4.19 x10E6/uL (ref 3.77–5.28)
RDW: 13.8 % (ref 11.7–15.4)
RPR Ser Ql: NONREACTIVE
Rh Factor: POSITIVE
Rubella Antibodies, IGG: 5.68 index (ref >0.99)
WBC: 8.7 10*3/uL (ref 3.4–10.8)

## 2019-08-14 LAB — COMPREHENSIVE METABOLIC PANEL
ALT: 6 IU/L (ref 0–32)
AST: 6 IU/L (ref 0–40)
Albumin/Globulin Ratio: 1 — ABNORMAL LOW (ref 1.2–2.2)
Albumin: 3.4 g/dL — ABNORMAL LOW (ref 3.9–5.0)
Alkaline Phosphatase: 78 IU/L (ref 39–117)
BUN/Creatinine Ratio: 16 (ref 9–23)
BUN: 9 mg/dL (ref 6–20)
Bilirubin Total: <0.2 mg/dL (ref 0.0–1.2)
CO2: 17 mmol/L — ABNORMAL LOW (ref 20–29)
Calcium: 9 mg/dL (ref 8.7–10.2)
Chloride: 104 mmol/L (ref 96–106)
Creatinine, Ser: 0.56 mg/dL — ABNORMAL LOW (ref 0.57–1.00)
GFR calc Af Amer: 146 mL/min/{1.73_m2} (ref >59)
GFR calc non Af Amer: 126 mL/min/{1.73_m2} (ref >59)
Globulin, Total: 3.5 g/dL (ref 1.5–4.5)
Glucose: 85 mg/dL (ref 65–99)
Potassium: 4 mmol/L (ref 3.5–5.2)
Sodium: 136 mmol/L (ref 134–144)
Total Protein: 6.9 g/dL (ref 6.0–8.5)

## 2019-08-14 LAB — URINE CULTURE, OB REFLEX

## 2019-08-16 ENCOUNTER — Other Ambulatory Visit: Payer: Self-pay | Admitting: Obstetrics

## 2019-08-16 DIAGNOSIS — B9689 Other specified bacterial agents as the cause of diseases classified elsewhere: Secondary | ICD-10-CM

## 2019-08-16 DIAGNOSIS — N760 Acute vaginitis: Secondary | ICD-10-CM

## 2019-08-16 LAB — CERVICOVAGINAL ANCILLARY ONLY
Bacterial Vaginitis (gardnerella): POSITIVE — AB
Candida Glabrata: NEGATIVE
Candida Vaginitis: NEGATIVE
Chlamydia: NEGATIVE
Comment: NEGATIVE
Comment: NEGATIVE
Comment: NEGATIVE
Comment: NEGATIVE
Comment: NEGATIVE
Comment: NORMAL
Neisseria Gonorrhea: NEGATIVE
Trichomonas: NEGATIVE

## 2019-08-16 LAB — CYTOLOGY - PAP
Comment: NEGATIVE
Diagnosis: NEGATIVE
High risk HPV: NEGATIVE

## 2019-08-16 MED ORDER — TINIDAZOLE 500 MG PO TABS: 1000.0000 mg | ORAL_TABLET | Freq: Every day | ORAL | 2 refills | Status: DC

## 2019-08-18 ENCOUNTER — Encounter (HOSPITAL_COMMUNITY): Payer: Medicaid Other

## 2019-08-19 ENCOUNTER — Ambulatory Visit (HOSPITAL_COMMUNITY): Payer: Medicaid Other

## 2019-08-19 ENCOUNTER — Ambulatory Visit (HOSPITAL_COMMUNITY)
Admission: RE | Admit: 2019-08-19 | Payer: Medicaid Other | Source: Ambulatory Visit | Attending: Obstetrics | Admitting: Obstetrics

## 2019-08-25 ENCOUNTER — Encounter: Payer: Medicaid Other | Admitting: Obstetrics and Gynecology

## 2019-08-25 ENCOUNTER — Encounter: Payer: Self-pay | Admitting: Obstetrics

## 2019-08-25 ENCOUNTER — Other Ambulatory Visit: Payer: Medicaid Other

## 2019-08-29 ENCOUNTER — Encounter: Payer: Self-pay | Admitting: Obstetrics

## 2019-09-09 ENCOUNTER — Other Ambulatory Visit (HOSPITAL_COMMUNITY): Payer: Self-pay | Admitting: *Deleted

## 2019-09-09 ENCOUNTER — Encounter (HOSPITAL_COMMUNITY): Payer: Self-pay

## 2019-09-09 ENCOUNTER — Ambulatory Visit (HOSPITAL_COMMUNITY): Payer: Medicaid Other | Admitting: *Deleted

## 2019-09-09 ENCOUNTER — Other Ambulatory Visit: Payer: Self-pay

## 2019-09-09 ENCOUNTER — Ambulatory Visit (HOSPITAL_COMMUNITY)
Admission: RE | Admit: 2019-09-09 | Discharge: 2019-09-09 | Disposition: A | Discharge status: Home / Self Care | Payer: Medicaid Other | Source: Ambulatory Visit | Attending: Obstetrics | Admitting: Obstetrics

## 2019-09-09 VITALS — BP 106/67 | HR 124 | Temp 97.2°F

## 2019-09-09 DIAGNOSIS — Z363 Encounter for antenatal screening for malformations: Secondary | ICD-10-CM | POA: Diagnosis not present

## 2019-09-09 DIAGNOSIS — O2692 Pregnancy related conditions, unspecified, second trimester: Secondary | ICD-10-CM

## 2019-09-09 DIAGNOSIS — O0990 Supervision of high risk pregnancy, unspecified, unspecified trimester: Secondary | ICD-10-CM

## 2019-09-09 DIAGNOSIS — I639 Cerebral infarction, unspecified: Secondary | ICD-10-CM | POA: Diagnosis not present

## 2019-09-09 DIAGNOSIS — E669 Obesity, unspecified: Secondary | ICD-10-CM

## 2019-09-09 DIAGNOSIS — Z362 Encounter for other antenatal screening follow-up: Secondary | ICD-10-CM

## 2019-09-09 DIAGNOSIS — O99212 Obesity complicating pregnancy, second trimester: Secondary | ICD-10-CM | POA: Diagnosis not present

## 2019-09-12 ENCOUNTER — Other Ambulatory Visit: Payer: Medicaid Other

## 2019-09-12 ENCOUNTER — Encounter: Payer: Medicaid Other | Admitting: Obstetrics and Gynecology

## 2019-09-12 DIAGNOSIS — O99210 Obesity complicating pregnancy, unspecified trimester: Secondary | ICD-10-CM | POA: Insufficient documentation

## 2019-09-12 DIAGNOSIS — D6859 Other primary thrombophilia: Secondary | ICD-10-CM | POA: Insufficient documentation

## 2019-09-12 DIAGNOSIS — O09299 Supervision of pregnancy with other poor reproductive or obstetric history, unspecified trimester: Secondary | ICD-10-CM | POA: Insufficient documentation

## 2019-09-26 ENCOUNTER — Encounter: Payer: Self-pay | Admitting: Obstetrics and Gynecology

## 2019-09-26 ENCOUNTER — Encounter: Payer: Medicaid Other | Admitting: Obstetrics and Gynecology

## 2019-09-26 ENCOUNTER — Other Ambulatory Visit: Payer: Medicaid Other

## 2019-09-26 ENCOUNTER — Other Ambulatory Visit: Payer: Self-pay

## 2019-09-26 ENCOUNTER — Ambulatory Visit (INDEPENDENT_AMBULATORY_CARE_PROVIDER_SITE_OTHER): Payer: Medicaid Other | Admitting: Obstetrics and Gynecology

## 2019-09-26 VITALS — BP 116/79 | HR 88 | Wt 271.0 lb

## 2019-09-26 DIAGNOSIS — O09899 Supervision of other high risk pregnancies, unspecified trimester: Secondary | ICD-10-CM

## 2019-09-26 DIAGNOSIS — O09299 Supervision of pregnancy with other poor reproductive or obstetric history, unspecified trimester: Secondary | ICD-10-CM

## 2019-09-26 DIAGNOSIS — O99119 Other diseases of the blood and blood-forming organs and certain disorders involving the immune mechanism complicating pregnancy, unspecified trimester: Secondary | ICD-10-CM

## 2019-09-26 DIAGNOSIS — D6859 Other primary thrombophilia: Secondary | ICD-10-CM

## 2019-09-26 DIAGNOSIS — O99210 Obesity complicating pregnancy, unspecified trimester: Secondary | ICD-10-CM

## 2019-09-26 DIAGNOSIS — O24410 Gestational diabetes mellitus in pregnancy, diet controlled: Secondary | ICD-10-CM

## 2019-09-26 NOTE — Progress Notes (Signed)
   PRENATAL VISIT NOTE  Subjective:  Angel [name] is a 29 y.o. G3P2002 at [redacted]w[redacted]d being seen today for ongoing prenatal care.  She is currently monitored for the following issues for this high-risk pregnancy and has Migraine; Morbid obesity (HCC); Stroke [facility]); Acute ischemic stroke (HCC); Anemia; Left arm numbness; Hyperlipidemia; Protein S deficiency (HCC); Supervision of other high risk pregnancy, antepartum; Protein S deficiency affecting pregnancy, antepartum (HCC); History of pre-eclampsia in prior pregnancy, currently pregnant; and Maternal obesity affecting pregnancy, antepartum on their problem list.  Patient reports no complaints.  Contractions: Not present. Vag. Bleeding: None.  Movement: Present. Denies leaking of fluid.   The following portions of the patient's history were reviewed and updated as appropriate: allergies, current medications, past family history, past medical history, past social history, past surgical history and problem list.   Objective:   Vitals:   09/26/19 0847  BP: 116/79  Pulse: 88  Weight: 271 lb (122.9 kg)    Fetal Status: Fetal Heart Rate (bpm): 150 Fundal Height: 26 cm Movement: Present     General:  Alert, oriented and cooperative. Patient is in no acute distress.  Skin: Skin is warm and dry. No rash noted.   Cardiovascular: Normal heart rate noted  Respiratory: Normal respiratory effort, no problems with respiration noted  Abdomen: Soft, gravid, appropriate for gestational age.  Pain/Pressure: Absent     Pelvic: Cervical exam deferred        Extremities: Normal range of motion.  Edema: None  Mental Status: Normal mood and affect. Normal behavior. Normal judgment and thought content.   Assessment and Plan:  Pregnancy: G3P2002 at [redacted]w[redacted]d 1. Supervision of other high risk pregnancy, antepartum Patient is doing well without complaints Patient is considering IUD for contraception Third trimester labs today - RPR - HIV Antibody (routine testing  w rflx) - CBC - Glucose Tolerance, 2 Hours w/1 Hour  2. Protein S deficiency affecting pregnancy, antepartum (HCC) Continue lovenox  3. History of pre-eclampsia in prior pregnancy, currently pregnant Continue ASA  4. Maternal obesity affecting pregnancy, antepartum Follow up growth 5/20  Preterm labor symptoms and general obstetric precautions including but not limited to vaginal bleeding, contractions, leaking of fluid and fetal movement were reviewed in detail with the patient. Please refer to After Visit Summary for other counseling recommendations.   Return in about 3 weeks (around 10/17/2019) for in person, ROB, High risk.  Future Appointments  Date Time Provider Department Center  10/13/2019  9:30 AM [facility] NURSE [facility]  10/13/2019  9:30 AM [facility] US3 [facility]    Catalina Antigua, MD

## 2019-09-27 ENCOUNTER — Other Ambulatory Visit: Payer: Self-pay

## 2019-09-27 DIAGNOSIS — O24410 Gestational diabetes mellitus in pregnancy, diet controlled: Secondary | ICD-10-CM

## 2019-09-27 DIAGNOSIS — O09299 Supervision of pregnancy with other poor reproductive or obstetric history, unspecified trimester: Secondary | ICD-10-CM | POA: Insufficient documentation

## 2019-09-27 DIAGNOSIS — O24414 Gestational diabetes mellitus in pregnancy, insulin controlled: Secondary | ICD-10-CM | POA: Insufficient documentation

## 2019-09-27 HISTORY — DX: Supervision of pregnancy with other poor reproductive or obstetric history, unspecified trimester: O09.299

## 2019-09-27 LAB — CBC
Hematocrit: 32.3 % — ABNORMAL LOW (ref 34.0–46.6)
Hemoglobin: 11 g/dL — ABNORMAL LOW (ref 11.1–15.9)
MCH: 29.4 pg (ref 26.6–33.0)
MCHC: 34.1 g/dL (ref 31.5–35.7)
MCV: 86 fL (ref 79–97)
Platelets: 311 10*3/uL (ref 150–450)
RBC: 3.74 x10E6/uL — ABNORMAL LOW (ref 3.77–5.28)
RDW: 13.7 % (ref 11.7–15.4)
WBC: 9.2 10*3/uL (ref 3.4–10.8)

## 2019-09-27 LAB — GLUCOSE TOLERANCE, 2 HOURS W/ 1HR
Glucose, 1 hour: 127 mg/dL (ref 65–179)
Glucose, 2 hour: 92 mg/dL (ref 65–152)
Glucose, Fasting: 93 mg/dL — ABNORMAL HIGH (ref 65–91)

## 2019-09-27 LAB — RPR: RPR Ser Ql: NONREACTIVE

## 2019-09-27 LAB — HIV ANTIBODY (ROUTINE TESTING W REFLEX): HIV Screen 4th Generation wRfx: NONREACTIVE

## 2019-09-27 MED ORDER — ACCU-CHEK SOFTCLIX LANCETS MISC: 1.0000 | Freq: Four times a day (QID) | 12 refills | Status: DC

## 2019-09-27 MED ORDER — ACCU-CHEK GUIDE VI STRP: ORAL_STRIP | 12 refills | Status: DC

## 2019-09-27 MED ORDER — ACCU-CHEK GUIDE W/DEVICE KIT: 1.0000 | PACK | Freq: Four times a day (QID) | 0 refills | Status: DC

## 2019-09-27 NOTE — Addendum Note (Signed)
Addended by: Catalina Antigua on: 09/27/2019 11:20 AM   Modules accepted: Orders

## 2019-09-27 NOTE — Progress Notes (Signed)
Pt made aware of 2hr results Supplies sent Dr.Constant added referral for teaching already.

## 2019-10-12 ENCOUNTER — Encounter: Payer: Medicaid Other | Attending: Obstetrics and Gynecology | Admitting: Registered"

## 2019-10-13 ENCOUNTER — Other Ambulatory Visit: Payer: Self-pay

## 2019-10-13 ENCOUNTER — Other Ambulatory Visit: Payer: Self-pay | Admitting: *Deleted

## 2019-10-13 ENCOUNTER — Ambulatory Visit (HOSPITAL_COMMUNITY): Payer: Medicaid Other | Attending: Obstetrics and Gynecology

## 2019-10-13 ENCOUNTER — Other Ambulatory Visit (HOSPITAL_COMMUNITY): Payer: Self-pay | Admitting: Obstetrics and Gynecology

## 2019-10-13 ENCOUNTER — Ambulatory Visit: Payer: Medicaid Other | Admitting: *Deleted

## 2019-10-13 DIAGNOSIS — O99212 Obesity complicating pregnancy, second trimester: Secondary | ICD-10-CM | POA: Diagnosis not present

## 2019-10-13 DIAGNOSIS — D6859 Other primary thrombophilia: Secondary | ICD-10-CM

## 2019-10-13 DIAGNOSIS — Z362 Encounter for other antenatal screening follow-up: Secondary | ICD-10-CM

## 2019-10-13 DIAGNOSIS — Z3A27 27 weeks gestation of pregnancy: Secondary | ICD-10-CM | POA: Diagnosis not present

## 2019-10-13 DIAGNOSIS — O99210 Obesity complicating pregnancy, unspecified trimester: Secondary | ICD-10-CM | POA: Insufficient documentation

## 2019-10-13 DIAGNOSIS — O24410 Gestational diabetes mellitus in pregnancy, diet controlled: Secondary | ICD-10-CM | POA: Diagnosis not present

## 2019-10-13 DIAGNOSIS — E669 Obesity, unspecified: Secondary | ICD-10-CM | POA: Diagnosis not present

## 2019-10-13 DIAGNOSIS — O09299 Supervision of pregnancy with other poor reproductive or obstetric history, unspecified trimester: Secondary | ICD-10-CM | POA: Diagnosis present

## 2019-10-13 DIAGNOSIS — O99112 Other diseases of the blood and blood-forming organs and certain disorders involving the immune mechanism complicating pregnancy, second trimester: Secondary | ICD-10-CM

## 2019-10-13 DIAGNOSIS — D682 Hereditary deficiency of other clotting factors: Secondary | ICD-10-CM

## 2019-10-13 DIAGNOSIS — O99119 Other diseases of the blood and blood-forming organs and certain disorders involving the immune mechanism complicating pregnancy, unspecified trimester: Secondary | ICD-10-CM | POA: Insufficient documentation

## 2019-10-13 DIAGNOSIS — O2692 Pregnancy related conditions, unspecified, second trimester: Secondary | ICD-10-CM | POA: Diagnosis not present

## 2019-10-13 DIAGNOSIS — O365920 Maternal care for other known or suspected poor fetal growth, second trimester, not applicable or unspecified: Secondary | ICD-10-CM | POA: Diagnosis not present

## 2019-10-17 ENCOUNTER — Encounter: Payer: Medicaid Other | Admitting: Obstetrics & Gynecology

## 2019-10-17 ENCOUNTER — Encounter: Payer: Self-pay | Admitting: Obstetrics & Gynecology

## 2019-10-17 NOTE — Progress Notes (Deleted)
   Patient did not show up today for her scheduled appointment.   Alistair Senft, MD, FACOG Obstetrician & Gynecologist, Faculty [facility], [facility]  

## 2019-10-25 ENCOUNTER — Ambulatory Visit: Payer: Medicaid Other

## 2019-10-25 ENCOUNTER — Ambulatory Visit: Payer: Medicaid Other | Attending: Obstetrics and Gynecology

## 2019-10-31 ENCOUNTER — Other Ambulatory Visit: Payer: Self-pay | Admitting: Obstetrics and Gynecology

## 2019-10-31 DIAGNOSIS — O24410 Gestational diabetes mellitus in pregnancy, diet controlled: Secondary | ICD-10-CM

## 2019-11-01 ENCOUNTER — Ambulatory Visit: Payer: Medicaid Other | Attending: Obstetrics and Gynecology

## 2019-11-01 ENCOUNTER — Ambulatory Visit: Payer: Medicaid Other

## 2019-11-02 ENCOUNTER — Encounter: Payer: Medicaid Other | Admitting: Nurse Practitioner

## 2019-11-08 ENCOUNTER — Encounter: Payer: Medicaid Other | Admitting: Obstetrics & Gynecology

## 2019-11-10 ENCOUNTER — Ambulatory Visit: Payer: Medicaid Other | Attending: Obstetrics

## 2019-11-10 ENCOUNTER — Ambulatory Visit: Payer: Medicaid Other

## 2019-11-17 ENCOUNTER — Ambulatory Visit (INDEPENDENT_AMBULATORY_CARE_PROVIDER_SITE_OTHER): Payer: Medicaid Other

## 2019-11-17 ENCOUNTER — Other Ambulatory Visit: Payer: Self-pay

## 2019-11-17 VITALS — BP 118/78 | HR 89 | Wt 291.4 lb

## 2019-11-17 DIAGNOSIS — O24410 Gestational diabetes mellitus in pregnancy, diet controlled: Secondary | ICD-10-CM

## 2019-11-17 DIAGNOSIS — O09299 Supervision of pregnancy with other poor reproductive or obstetric history, unspecified trimester: Secondary | ICD-10-CM

## 2019-11-17 DIAGNOSIS — O09293 Supervision of pregnancy with other poor reproductive or obstetric history, third trimester: Secondary | ICD-10-CM

## 2019-11-17 DIAGNOSIS — O09899 Supervision of other high risk pregnancies, unspecified trimester: Secondary | ICD-10-CM

## 2019-11-17 DIAGNOSIS — Z3A32 32 weeks gestation of pregnancy: Secondary | ICD-10-CM

## 2019-11-17 DIAGNOSIS — Z7689 Persons encountering health services in other specified circumstances: Secondary | ICD-10-CM | POA: Diagnosis not present

## 2019-11-17 DIAGNOSIS — O99210 Obesity complicating pregnancy, unspecified trimester: Secondary | ICD-10-CM

## 2019-11-17 DIAGNOSIS — D6859 Other primary thrombophilia: Secondary | ICD-10-CM

## 2019-11-17 DIAGNOSIS — E669 Obesity, unspecified: Secondary | ICD-10-CM

## 2019-11-17 DIAGNOSIS — O99213 Obesity complicating pregnancy, third trimester: Secondary | ICD-10-CM

## 2019-11-17 LAB — GLUCOSE, POCT (MANUAL RESULT ENTRY): POC Glucose: 100 mg/dl — AB (ref 70–99)

## 2019-11-17 MED ORDER — ACCU-CHEK GUIDE VI STRP: ORAL_STRIP | 12 refills | Status: DC

## 2019-11-17 MED ORDER — ACCU-CHEK GUIDE W/DEVICE KIT: 1.0000 | PACK | Freq: Four times a day (QID) | 0 refills | Status: DC

## 2019-11-17 MED ORDER — ACCU-CHEK SOFTCLIX LANCETS MISC: 1.0000 | Freq: Four times a day (QID) | 12 refills | Status: DC

## 2019-11-17 NOTE — Progress Notes (Signed)
Pt is here for ROB, [redacted]w[redacted]d. Pt states that she has had trouble getting transportation to her GDM education class, she reports she has it rescheduled. Pt also reports she has had trouble picking up her glucose monitoring supplies from the pharmacy so she is going to transfer the prescriptions to a different pharmacy, I advised pt to let us know if she needs help with this.

## 2019-11-17 NOTE — Patient Instructions (Signed)
Gestational Diabetes Mellitus, Self Care  Caring for yourself after you have been diagnosed with gestational diabetes (gestational diabetes mellitus) means keeping your blood sugar (glucose) under control. You can do that with a balance of:   Nutrition.   Exercise.   Lifestyle changes.   Medicines or insulin, if necessary.   Support from your team of health care providers and others.  The following information explains what you need to know to manage your gestational diabetes at home.  What are the risks?  If gestational diabetes is treated, it is unlikely to cause problems. If it is not controlled with treatment, it may cause problems during labor and delivery, and some of those problems can be harmful to the unborn baby (fetus) and the mother. Uncontrolled gestational diabetes may also cause the newborn baby to have breathing problems and low blood glucose.  Women who get gestational diabetes are more likely to develop it if they get pregnant again, and they are more likely to develop type 2 diabetes in the future.  How to monitor blood glucose     Check your blood glucose every day during your pregnancy. Do this as often as told by your health care provider.   Contact your health care provider if your blood glucose is above your target for two tests in a row.  Your health care provider will set individualized treatment goals for you. Generally, the goal of treatment is to maintain the following blood glucose levels during pregnancy:   Before meals (preprandial): at or below 95 mg/dL (5.3 mmol/L).   After meals (postprandial):  ? One hour after a meal: at or below 140 mg/dL (7.8 mmol/L).  ? Two hours after a meal: at or below 120 mg/dL (6.7 mmol/L).   A1c (hemoglobin A1c) level: 6-6.5%.  How to manage hyperglycemia and hypoglycemia  Hyperglycemia symptoms  Hyperglycemia, also called high blood glucose, occurs when blood glucose is too high. Make sure you know the early signs of hyperglycemia, such  as:   Increased thirst.   Hunger.   Feeling very tired.   Needing to urinate more often than usual.   Blurry vision.  Hypoglycemia symptoms  Hypoglycemia, also called low blood glucose, occurs with a blood glucose level at or below 70 mg/dL (3.9 mmol/L). The risk for hypoglycemia increases during or after exercise, during sleep, during illness, and when skipping meals or not eating for a long time (fasting). Symptoms may include:   Hunger.   Anxiety.   Sweating and feeling clammy.   Confusion.   Dizziness or feeling light-headed.   Sleepiness.   Nausea.   Increased heart rate.   Headache.   Blurry vision.   Irritability.   Tingling or numbness around the mouth, lips, or tongue.   A change in coordination.   Restless sleep.   Fainting.   Seizure.  It is important to know the symptoms of hypoglycemia and treat it right away. Always have a 15-gram rapid-acting carbohydrate snack with you to treat low blood glucose. Family members and close friends should also know the symptoms and should understand how to treat hypoglycemia, in case you are not able to treat yourself.  Treating hypoglycemia  If you are alert and able to swallow safely, follow the 15:15 rule:   Take 15 grams of a rapid-acting carbohydrate. Talk with your health care provider about how much you should take.   Rapid-acting options include:  ? Glucose pills (take 15 grams).  ? 6-8 pieces of hard candy.  ?   mmol/L), take 15 grams of a carbohydrate again.  If your blood glucose level does not increase above 70 mg/dL (3.9 mmol/L) after 3 tries, seek emergency medical care.  After your blood glucose level returns to normal, eat a meal or a snack within 1 hour. Treating  severe hypoglycemia Severe hypoglycemia is when your blood glucose level is at or below 54 mg/dL (3 mmol/L). Severe hypoglycemia is an emergency. Do not wait to see if the symptoms will go away. Get medical help right away. Call your local emergency services (911 in the [place] If you have severe hypoglycemia and you cannot eat or drink, you may need an injection of glucagon. A family member or close friend should learn how to check your blood glucose and how to give you a glucagon injection. Ask your health care provider if you need to have an emergency glucagon injection kit available. Severe hypoglycemia may need to be treated in a hospital. The treatment may include getting glucose through an IV. You may also need treatment for the cause of your hypoglycemia. Follow these instructions at home: Take diabetes medicines as told  If your health care provider prescribed insulin or diabetes medicines, take them every day.  Do not run out of insulin or other diabetes medicines that you take. Plan ahead so you always have these available.  If you use insulin, adjust your dosage based on how physically active you are and what foods you eat. Your health care provider will tell you how to adjust your dosage. Make healthy food choices  The things that you eat and drink affect your blood glucose (and your insulin dose, if this applies). Making good choices helps to control your diabetes and prevent other health problems. A healthy meal plan includes eating lean proteins, complex carbohydrates, fresh fruits and vegetables, low-fat dairy products, and healthy fats. Make an appointment to see a diet and nutrition specialist (registered dietitian) to help you create an eating plan that is right for you. Make sure that you:  Follow instructions from your health care provider about eating or drinking restrictions.  Drink enough fluid to keep your urine pale yellow.  Eat healthy snacks between nutritious  meals.  Keep a record of the carbohydrates that you eat. Do this by reading food labels and learning the standard serving sizes of foods.  Follow your sick day plan whenever you cannot eat or drink as usual. Make this plan in advance with your health care provider.  Stay active  Do 30 or more minutes of physical activity a day, or as much physical activity as your health care provider recommends during your pregnancy. ? Doing 10 minutes of exercise starting 30 minutes after each meal may help to control postprandial blood glucose levels.  If you start a new exercise or activity, work with your health care provider to adjust your insulin, medicines, or food intake as needed. Make healthy lifestyle choices  Do not drink alcohol.  Do not use any tobacco products, such as cigarettes, chewing tobacco, and e-cigarettes. If you need help quitting, ask your health care provider.  Learn to manage stress. If you need help with this, ask your health care provider. Care for your body  Keep your immunizations up to date.  Brush your teeth and gums two times a day, and floss one or more times a day. Visit your dentist one or more times every 6 months.  Maintain a healthy weight during your pregnancy. General instructions  Take over-the-counter and  prescription medicines only as told by your health care provider.  Talk with your health care provider about your risk for high blood pressure during pregnancy (preeclampsia or eclampsia).  Share your diabetes management plan with people in your workplace, school, and household.  Check your urine for ketones during your pregnancy when you are ill and as told by your health care provider.  Carry a medical alert card or wear medical alert jewelry that says you have gestational diabetes.  Keep all follow-up visits during your pregnancy (prenatal) and after delivery (postnatal) as told by your health care provider. This is important. Get the care that  you need after delivery  Have your blood glucose level checked 4-12 weeks after delivery. This is done with an oral glucose tolerance test (OGTT).  Get screened for diabetes at least every 3 years, or as often as told by your health care provider. Questions to ask your health care provider  Do I need to meet with a diabetes educator?  Where can I find a support group for people with gestational diabetes? Where to find more information For more information about gestational diabetes, visit:  American Diabetes Association (ADA): www.diabetes.org  Centers for Disease Control and Prevention (CDC): www.cdc.gov Summary  Check your blood glucose every day during your pregnancy. Do this as often as told by your health care provider.  If your health care provider prescribed insulin or diabetes medicines, take them every day as told.  Keep all follow-up visits during your pregnancy (prenatal) and after delivery (postnatal) as told by your health care provider. This is important.  Have your blood glucose level checked 4-12 weeks after delivery. This information is not intended to replace advice given to you by your health care provider. Make sure you discuss any questions you have with your health care provider. Document Revised: 11/02/2017 Document Reviewed: 06/15/2015 Elsevier Patient Education  2020 [employer]  

## 2019-11-17 NOTE — Progress Notes (Signed)
   HIGH-RISK PREGNANCY OFFICE VISIT  Patient name: Angel [name] MRN [id number]  Date of birth: 03/24/1991 Chief Complaint:   Routine Prenatal Visit  Subjective:   [name] is a 29 y.o. G41P2002 female at [redacted]w[redacted]d with an Estimated Date of Delivery: 01/06/20 being seen today for ongoing management of a high-risk pregnancy. Patient reports that due to transportation issues she has been unable to make appt at [facility] or pick-up glucometer.  However, patient reports that she is complaint with her baby aspirin and lovenox dosing.  Patient reports some BH contractions, but denies other concerns. Contractions: Not present. Vag. Bleeding: None.  Movement: Present.  Reviewed past medical,surgical, social, obstetrical and family history as well as problem list, medications and allergies.  Objective   Vitals:   11/17/19 0857  BP: 118/78  Pulse: 89  Weight: 291 lb 6.4 oz (132.2 kg)  Body mass index is 44.97 kg/m.        Physical Examination:   General appearance: Well appearing, and in no distress  Mental status: Alert, oriented to person, place, and time  Skin: Warm & dry  Cardiovascular: Normal heart rate noted  Respiratory: Normal respiratory effort, no distress  Abdomen: Soft, gravid, nontender, LGA with Fundal height of Fundal Height: 35 cm  Pelvic: Cervical exam deferred           Extremities: Edema: None  Fetal Status: Fetal Heart Rate (bpm): 145  Movement: Present    Results for orders placed or performed in visit on 11/17/19 (from the past 24 hour(s))  POCT Glucose (CBG)   Collection Time: 11/17/19  9:18 AM  Result Value Ref Range   POC Glucose 100 (A) 70 - 99 mg/dl    Assessment & Plan:  Low-risk pregnancy of a 29 y.o., G6K5993 at [redacted]w[redacted]d with an Estimated Date of Delivery: 01/06/20   1. Diet controlled gestational diabetes mellitus (GDM), antepartum -Patient informed of need for compliance. -Reviewed previous US that showed mild poly and concern for  FGR. -Discussed how uncontrolled diabetes and other co-morbidities can contribute to these findings.  -Fasting CBG 100 today -Dr. Earlene Plater consulted regarding patient status and advises: *RTO in one week *Plan for insulin supplementation after review of sugars next week. -Discussed POC with patient who reports she was insulin dependent in last pregnancy! -Patient encouraged to pick up glucometer and keep scheduled MFM appt.   2. Supervision of other high risk pregnancy, antepartum -Patient to meet with MD next week and throughout pregnancy. -Reviewed birth control methods including IUD and Nexplanon. -Informed that these two methods should not be a contra indication considering extensive history.   3. Protein S deficiency (HCC) -Last Dose Lovenox was at 7pm yesterday  4. History of pre-eclampsia in prior pregnancy, currently pregnant -Taking daily baby aspirin. Last dose was yesterday at 7pm  5. Maternal obesity affecting pregnancy, antepartum -BMI 45    Meds: No orders of the defined types were placed in this encounter.  Labs/procedures today:  Lab Orders     POCT Glucose (CBG)   Reviewed: Preterm labor symptoms and general obstetric precautions including but not limited to vaginal bleeding, contractions, leaking of fluid and fetal movement were reviewed in detail with the patient.  All questions were answered.  Follow-up: No follow-ups on file.  Orders Placed This Encounter  Procedures  . POCT Glucose (CBG)   Cherre Robins MSN, CNM 11/17/2019

## 2019-11-21 ENCOUNTER — Ambulatory Visit: Payer: Medicaid Other | Attending: Obstetrics

## 2019-11-21 ENCOUNTER — Ambulatory Visit: Payer: Medicaid Other | Admitting: *Deleted

## 2019-11-21 ENCOUNTER — Other Ambulatory Visit: Payer: Self-pay | Admitting: *Deleted

## 2019-11-21 ENCOUNTER — Other Ambulatory Visit: Payer: Self-pay

## 2019-11-21 DIAGNOSIS — O365930 Maternal care for other known or suspected poor fetal growth, third trimester, not applicable or unspecified: Secondary | ICD-10-CM

## 2019-11-21 DIAGNOSIS — Z7689 Persons encountering health services in other specified circumstances: Secondary | ICD-10-CM | POA: Diagnosis not present

## 2019-11-21 DIAGNOSIS — E669 Obesity, unspecified: Secondary | ICD-10-CM

## 2019-11-21 DIAGNOSIS — O09299 Supervision of pregnancy with other poor reproductive or obstetric history, unspecified trimester: Secondary | ICD-10-CM | POA: Insufficient documentation

## 2019-11-21 DIAGNOSIS — O99210 Obesity complicating pregnancy, unspecified trimester: Secondary | ICD-10-CM | POA: Diagnosis not present

## 2019-11-21 DIAGNOSIS — O2693 Pregnancy related conditions, unspecified, third trimester: Secondary | ICD-10-CM | POA: Diagnosis not present

## 2019-11-21 DIAGNOSIS — O99119 Other diseases of the blood and blood-forming organs and certain disorders involving the immune mechanism complicating pregnancy, unspecified trimester: Secondary | ICD-10-CM | POA: Diagnosis not present

## 2019-11-21 DIAGNOSIS — O24410 Gestational diabetes mellitus in pregnancy, diet controlled: Secondary | ICD-10-CM

## 2019-11-21 DIAGNOSIS — O99213 Obesity complicating pregnancy, third trimester: Secondary | ICD-10-CM

## 2019-11-21 DIAGNOSIS — D6859 Other primary thrombophilia: Secondary | ICD-10-CM

## 2019-11-21 DIAGNOSIS — Z362 Encounter for other antenatal screening follow-up: Secondary | ICD-10-CM | POA: Diagnosis not present

## 2019-11-21 DIAGNOSIS — Z3A33 33 weeks gestation of pregnancy: Secondary | ICD-10-CM

## 2019-11-21 DIAGNOSIS — D530 Protein deficiency anemia: Secondary | ICD-10-CM | POA: Diagnosis not present

## 2019-11-24 ENCOUNTER — Other Ambulatory Visit: Payer: Self-pay

## 2019-11-24 ENCOUNTER — Ambulatory Visit (INDEPENDENT_AMBULATORY_CARE_PROVIDER_SITE_OTHER): Payer: Medicaid Other | Admitting: Family Medicine

## 2019-11-24 VITALS — BP 129/75 | HR 84 | Wt 293.5 lb

## 2019-11-24 DIAGNOSIS — O99210 Obesity complicating pregnancy, unspecified trimester: Secondary | ICD-10-CM

## 2019-11-24 DIAGNOSIS — O24410 Gestational diabetes mellitus in pregnancy, diet controlled: Secondary | ICD-10-CM

## 2019-11-24 DIAGNOSIS — O09299 Supervision of pregnancy with other poor reproductive or obstetric history, unspecified trimester: Secondary | ICD-10-CM

## 2019-11-24 DIAGNOSIS — D6859 Other primary thrombophilia: Secondary | ICD-10-CM

## 2019-11-24 DIAGNOSIS — O09899 Supervision of other high risk pregnancies, unspecified trimester: Secondary | ICD-10-CM

## 2019-11-24 DIAGNOSIS — O99119 Other diseases of the blood and blood-forming organs and certain disorders involving the immune mechanism complicating pregnancy, unspecified trimester: Secondary | ICD-10-CM

## 2019-11-24 LAB — POCT CBG (FASTING - GLUCOSE)-MANUAL ENTRY: Glucose Fasting, POC: 106 mg/dL — AB (ref 70–99)

## 2019-11-24 MED ORDER — METFORMIN HCL 500 MG PO TABS: 500.0000 mg | ORAL_TABLET | Freq: Two times a day (BID) | ORAL | 0 refills | Status: DC

## 2019-11-24 NOTE — Progress Notes (Signed)
PRENATAL VISIT NOTE  Subjective:  Angel [name] is a 29 y.o. G3P2002 at [redacted]w[redacted]d being seen today for ongoing prenatal care.  She is currently monitored for the following issues for this high-risk pregnancy and has Migraine; Morbid obesity (HCC); History of ischemic stroke x2 in 2015; Anemia; Protein S deficiency (HCC); Supervision of other high risk pregnancy, antepartum; Protein S deficiency affecting pregnancy, antepartum (HCC); History of pre-eclampsia in prior pregnancy, currently pregnant; Maternal obesity affecting pregnancy, antepartum; and Gestational diabetes mellitus in pregnancy, controlled by oral hypoglycemic drugs on their problem list.  Patient reports no complaints. She has not been able to pick up her glucose monitor as she was told her insurance did not cover it. Contractions: Not present.  .  Movement: Present. Denies leaking of fluid.   The following portions of the patient's history were reviewed and updated as appropriate: allergies, current medications, past family history, past medical history, past social history, past surgical history and problem list.   Objective:   Vitals:   11/24/19 0846  BP: 129/75  Pulse: 84  Weight: 293 lb 8 oz (133.1 kg)    Fetal Status: Fetal Heart Rate (bpm): 145 Fundal Height: 35 cm Movement: Present     General:  Alert, oriented and cooperative. Patient is in no acute distress.  Skin: Skin is warm and dry. No rash noted.   Cardiovascular: Normal heart rate noted  Respiratory: Normal respiratory effort, no problems with respiration noted  Abdomen: Soft, gravid, appropriate for gestational age.  Pain/Pressure: Absent     Pelvic: Cervical exam deferred        Extremities: Normal range of motion.  Edema: Trace  Mental Status: Normal mood and affect. Normal behavior. Normal judgment and thought content.   Assessment and Plan:  Pregnancy: G3P2002 at [redacted]w[redacted]d 1. Supervision of other high risk pregnancy, antepartum - Reviewed most recent  US and 28 wk labs; hx of possible FGR but 40% EFW on last scan; f/u in 4 weeks - RTC in 2 weeks for 36w labs - boy-inpatient, breast, considering PP IUD  2. Diet controlled gestational diabetes mellitus (GDM) in third trimester; now GDMA2 as of today's visit - hx of GDMA2 on insulin with 2nd pregnancy and poor control with delivery at 37 weeks - patient last ate at 2000 last night (> 13 hours ago) and glucose is 106 on fingerstick - will start Metformin 500 mg BID - Glucose monitor given to patient in office; she is to call sooner if checking sugars and consistently greater than 95 fasting or 120 for 2 hour PP - discussed IOL likely around 38 weeks  3. Protein S deficiency affecting pregnancy, antepartum (HCC) - cont Lovenox; reports compliance  4. History of pre-eclampsia in prior pregnancy, currently pregnant - Pre-E with first pregnancy only - cont ASA - BP's normal thus far; checking at home and normal as well   5. Maternal obesity affecting pregnancy, antepartum Wt Readings from Last 3 Encounters:  11/24/19 293 lb 8 oz (133.1 kg)  11/17/19 291 lb 6.4 oz (132.2 kg)  09/26/19 271 lb (122.9 kg)   Preterm labor symptoms and general obstetric precautions including but not limited to vaginal bleeding, contractions, leaking of fluid and fetal movement were reviewed in detail with the patient. Please refer to After Visit Summary for other counseling recommendations.   Return in about 2 weeks (around 12/08/2019) for [facility]; in-person.  Future Appointments  Date Time Provider Department Center  12/19/2019 10:30 AM [facility] NURSE [facility]  12/19/2019  10:45 AM [facility] US[facility]    Joselyn Arrow, MD

## 2019-11-24 NOTE — Progress Notes (Signed)
Patient reports fetal movement, denies pain. Pt does not have glucose meter because she currently has FP medicaid, advised to contact case worker to be switched to pregnancy medicaid, pt agreed. Provided pt with meter from office.

## 2019-12-02 ENCOUNTER — Other Ambulatory Visit: Payer: Self-pay

## 2019-12-02 DIAGNOSIS — O24410 Gestational diabetes mellitus in pregnancy, diet controlled: Secondary | ICD-10-CM

## 2019-12-02 MED ORDER — METFORMIN HCL 500 MG PO TABS: 1000.0000 mg | ORAL_TABLET | Freq: Two times a day (BID) | ORAL | 0 refills | Status: DC

## 2019-12-08 ENCOUNTER — Ambulatory Visit (INDEPENDENT_AMBULATORY_CARE_PROVIDER_SITE_OTHER): Payer: Medicaid Other | Admitting: Obstetrics and Gynecology

## 2019-12-08 ENCOUNTER — Other Ambulatory Visit (HOSPITAL_COMMUNITY)
Admission: RE | Admit: 2019-12-08 | Discharge: 2019-12-08 | Disposition: A | Discharge status: Home / Self Care | Payer: Medicaid Other | Source: Ambulatory Visit | Attending: Obstetrics and Gynecology | Admitting: Obstetrics and Gynecology

## 2019-12-08 ENCOUNTER — Other Ambulatory Visit: Payer: Self-pay

## 2019-12-08 VITALS — BP 121/80 | HR 89 | Wt 292.0 lb

## 2019-12-08 DIAGNOSIS — O09293 Supervision of pregnancy with other poor reproductive or obstetric history, third trimester: Secondary | ICD-10-CM

## 2019-12-08 DIAGNOSIS — O26893 Other specified pregnancy related conditions, third trimester: Secondary | ICD-10-CM

## 2019-12-08 DIAGNOSIS — D6859 Other primary thrombophilia: Secondary | ICD-10-CM

## 2019-12-08 DIAGNOSIS — O24415 Gestational diabetes mellitus in pregnancy, controlled by oral hypoglycemic drugs: Secondary | ICD-10-CM

## 2019-12-08 DIAGNOSIS — O09899 Supervision of other high risk pregnancies, unspecified trimester: Secondary | ICD-10-CM | POA: Diagnosis not present

## 2019-12-08 DIAGNOSIS — O09299 Supervision of pregnancy with other poor reproductive or obstetric history, unspecified trimester: Secondary | ICD-10-CM

## 2019-12-08 DIAGNOSIS — O99210 Obesity complicating pregnancy, unspecified trimester: Secondary | ICD-10-CM

## 2019-12-08 DIAGNOSIS — R519 Headache, unspecified: Secondary | ICD-10-CM

## 2019-12-08 DIAGNOSIS — Z3A35 35 weeks gestation of pregnancy: Secondary | ICD-10-CM

## 2019-12-08 DIAGNOSIS — O99113 Other diseases of the blood and blood-forming organs and certain disorders involving the immune mechanism complicating pregnancy, third trimester: Secondary | ICD-10-CM

## 2019-12-08 MED ORDER — GLYBURIDE 2.5 MG PO TABS: 2.5000 mg | ORAL_TABLET | Freq: Two times a day (BID) | ORAL | 3 refills | Status: DC

## 2019-12-08 MED ORDER — BUTALBITAL-APAP-CAFFEINE 50-325-40 MG PO CAPS: 1.0000 | ORAL_CAPSULE | Freq: Four times a day (QID) | ORAL | 3 refills | Status: DC | PRN

## 2019-12-08 NOTE — Patient Instructions (Signed)

## 2019-12-08 NOTE — Progress Notes (Signed)
Pt states she is having pelvic pressure and some ctx.  Pt states her sugars have been running a little high- taking 1000mg  Metformin 2x daily.

## 2019-12-08 NOTE — Progress Notes (Signed)
° °  PRENATAL VISIT NOTE  Subjective:  Angel [name] is a 29 y.o. G3P2002 at [redacted]w[redacted]d being seen today for ongoing prenatal care.  She is currently monitored for the following issues for this high-risk pregnancy and has Migraine; Morbid obesity (HCC); History of ischemic stroke x2 in 2015; Anemia; Protein S deficiency (HCC); Supervision of other high risk pregnancy, antepartum; Protein S deficiency affecting pregnancy, antepartum (HCC); History of pre-eclampsia in prior pregnancy, currently pregnant; Maternal obesity affecting pregnancy, antepartum; and Gestational diabetes mellitus in pregnancy, controlled by oral hypoglycemic drugs on their problem list.  Patient doing well with no acute concerns today. She reports no complaints.  Contractions: Irregular. Vag. Bleeding: None.  Movement: Present. Denies leaking of fluid.   The following portions of the patient's history were reviewed and updated as appropriate: allergies, current medications, past family history, past medical history, past social history, past surgical history and problem list. Problem list updated.  Objective:   Vitals:   12/08/19 0942  BP: 121/80  Pulse: 89  Weight: 292 lb (132.5 kg)    Fetal Status: Fetal Heart Rate (bpm): 145   Movement: Present     General:  Alert, oriented and cooperative. Patient is in no acute distress.  Skin: Skin is warm and dry. No rash noted.   Cardiovascular: Normal heart rate noted  Respiratory: Normal respiratory effort, no problems with respiration noted  Abdomen: Soft, obese,gravid, appropriate for gestational age.  Pain/Pressure: Present     Pelvic: Cervical exam performed      o.5/30/-4  Extremities: Normal range of motion.     Mental Status:  Normal mood and affect. Normal behavior. Normal judgment and thought content.   Assessment and Plan:  Pregnancy: G3P2002 at [redacted]w[redacted]d  1. Gestational diabetes mellitus in pregnancy, controlled by oral hypoglycemic drugs FBS 96-106 PP BS  129-160 Added glyburide for sugar control, pt still on metformin 2000 mg/day If BS still elevated, will need to convert to insulin - glyBURIDE (DIABETA) 2.5 MG tablet; Take 1 tablet (2.5 mg total) by mouth 2 (two) times daily with a meal.  Dispense: 60 tablet; Refill: 3  2. Protein S deficiency (HCC) Continue lovenox, will hold 24 hours prior to scheduled c section  3. Protein S deficiency affecting pregnancy, antepartum (HCC)   4. Morbid obesity (HCC)   5. Supervision of other high risk pregnancy, antepartum  - Strep Gp B NAA - Cervicovaginal ancillary only( [place])  6. History of pre-eclampsia in prior pregnancy, currently pregnant No s/sx of preeclampsia  7. Maternal obesity affecting pregnancy, antepartum   8. Headache in pregnancy, third trimester  - Butalbital-APAP-Caffeine 50-325-40 MG capsule; Take 1 capsule by mouth every 6 (six) hours as needed for headache.  Dispense: 30 capsule; Refill: 3  Preterm labor symptoms and general obstetric precautions including but not limited to vaginal bleeding, contractions, leaking of fluid and fetal movement were reviewed in detail with the patient.  Please refer to After Visit Summary for other counseling recommendations.   Return in about 1 week (around 12/15/2019) for [facility], in person.   Mariel Aloe, MD

## 2019-12-10 LAB — STREP GP B NAA: Strep Gp B NAA: NEGATIVE

## 2019-12-14 ENCOUNTER — Ambulatory Visit (INDEPENDENT_AMBULATORY_CARE_PROVIDER_SITE_OTHER): Payer: Medicaid Other | Admitting: Obstetrics and Gynecology

## 2019-12-14 ENCOUNTER — Other Ambulatory Visit: Payer: Self-pay

## 2019-12-14 VITALS — BP 114/77 | HR 103 | Wt 296.6 lb

## 2019-12-14 DIAGNOSIS — O09899 Supervision of other high risk pregnancies, unspecified trimester: Secondary | ICD-10-CM

## 2019-12-14 DIAGNOSIS — Z3A36 36 weeks gestation of pregnancy: Secondary | ICD-10-CM

## 2019-12-14 DIAGNOSIS — O99210 Obesity complicating pregnancy, unspecified trimester: Secondary | ICD-10-CM

## 2019-12-14 DIAGNOSIS — O09293 Supervision of pregnancy with other poor reproductive or obstetric history, third trimester: Secondary | ICD-10-CM

## 2019-12-14 DIAGNOSIS — O09299 Supervision of pregnancy with other poor reproductive or obstetric history, unspecified trimester: Secondary | ICD-10-CM

## 2019-12-14 DIAGNOSIS — E669 Obesity, unspecified: Secondary | ICD-10-CM

## 2019-12-14 DIAGNOSIS — O24415 Gestational diabetes mellitus in pregnancy, controlled by oral hypoglycemic drugs: Secondary | ICD-10-CM

## 2019-12-14 DIAGNOSIS — Z8673 Personal history of transient ischemic attack (TIA), and cerebral infarction without residual deficits: Secondary | ICD-10-CM

## 2019-12-14 DIAGNOSIS — O99213 Obesity complicating pregnancy, third trimester: Secondary | ICD-10-CM

## 2019-12-14 MED ORDER — INSULIN NPH (HUMAN) (ISOPHANE) 100 UNIT/ML ~~LOC~~ SUSP
SUBCUTANEOUS | 3 refills | Status: DC
Start: 2019-12-14 — End: 2019-12-26

## 2019-12-14 MED ORDER — INSULIN ASPART 100 UNIT/ML ~~LOC~~ SOLN
22.0000 [IU] | Freq: Three times a day (TID) | SUBCUTANEOUS | 12 refills | Status: DC
Start: 2019-12-14 — End: 2019-12-26

## 2019-12-14 MED ORDER — "INSULIN SYRINGE-NEEDLE U-100 26G X 1/2"" 1 ML MISC": 0 refills | Status: DC

## 2019-12-14 NOTE — Progress Notes (Signed)
   PRENATAL VISIT NOTE  Subjective:  Angel [name] is a 29 y.o. G3P2002 at [redacted]w[redacted]d being seen today for ongoing prenatal care.  She is currently monitored for the following issues for this high-risk pregnancy and has Migraine; Morbid obesity (HCC); History of ischemic stroke x2 in 2015; Anemia; Protein S deficiency (HCC); Supervision of other high risk pregnancy, antepartum; Protein S deficiency affecting pregnancy, antepartum (HCC); History of pre-eclampsia in prior pregnancy, currently pregnant; Maternal obesity affecting pregnancy, antepartum; and Gestational diabetes mellitus in pregnancy, controlled by oral hypoglycemic drugs on their problem list.  Patient reports no complaints.  Contractions: Irritability. Vag. Bleeding: None.  Movement: Present. Denies leaking of fluid.   The following portions of the patient's history were reviewed and updated as appropriate: allergies, current medications, past family history, past medical history, past social history, past surgical history and problem list.   Objective:   Vitals:   12/14/19 1548  BP: 114/77  Pulse: (!) 103  Weight: 296 lb 9.6 oz (134.5 kg)    Fetal Status: Fetal Heart Rate (bpm): 156 Fundal Height: 37 cm Movement: Present     General:  Alert, oriented and cooperative. Patient is in no acute distress.  Skin: Skin is warm and dry. No rash noted.   Cardiovascular: Normal heart rate noted  Respiratory: Normal respiratory effort, no problems with respiration noted  Abdomen: Soft, gravid, appropriate for gestational age.  Pain/Pressure: Present     Pelvic: Cervical exam deferred        Extremities: Normal range of motion.  Edema: None  Mental Status: Normal mood and affect. Normal behavior. Normal judgment and thought content.   Assessment and Plan:  Pregnancy: G3P2002 at [redacted]w[redacted]d 1. Supervision of other high risk pregnancy, antepartum Patient is doing well without complaints  2. Gestational diabetes mellitus in pregnancy,  controlled by oral hypoglycemic drugs CBGs reviewed and all values elevated. Patient started on insulin NPH 59 am/23 pm Regular 23 with meals Start weekly antenatal testing - [place] MFM FETAL BPP WO NON STRESS; Future  3. History of pre-eclampsia in prior pregnancy, currently pregnant Normotensive Continue ASA   4. History of ischemic stroke x2 in 2015 Continue lovenox  5. Maternal obesity affecting pregnancy, antepartum   Preterm labor symptoms and general obstetric precautions including but not limited to vaginal bleeding, contractions, leaking of fluid and fetal movement were reviewed in detail with the patient. Please refer to After Visit Summary for other counseling recommendations.   Return in about 1 week (around 12/21/2019) for in person, [facility], High risk.  Future Appointments  Date Time Provider Department Center  12/19/2019 10:30 AM [facility]SE [facility]  12/19/2019 10:45 AM [facility] US[facility]  12/22/2019  9:00 AM Warden Fillers, MD [facility] None    Catalina Antigua, MD

## 2019-12-15 LAB — CERVICOVAGINAL ANCILLARY ONLY
Chlamydia: NEGATIVE
Comment: NEGATIVE
Comment: NORMAL
Neisseria Gonorrhea: NEGATIVE

## 2019-12-16 ENCOUNTER — Ambulatory Visit: Payer: Medicaid Other | Attending: Obstetrics and Gynecology

## 2019-12-16 ENCOUNTER — Ambulatory Visit: Payer: Medicaid Other | Admitting: *Deleted

## 2019-12-16 ENCOUNTER — Other Ambulatory Visit: Payer: Self-pay

## 2019-12-16 DIAGNOSIS — O99119 Other diseases of the blood and blood-forming organs and certain disorders involving the immune mechanism complicating pregnancy, unspecified trimester: Secondary | ICD-10-CM | POA: Diagnosis not present

## 2019-12-16 DIAGNOSIS — D6859 Other primary thrombophilia: Secondary | ICD-10-CM | POA: Diagnosis not present

## 2019-12-16 DIAGNOSIS — O99210 Obesity complicating pregnancy, unspecified trimester: Secondary | ICD-10-CM | POA: Diagnosis not present

## 2019-12-16 DIAGNOSIS — E669 Obesity, unspecified: Secondary | ICD-10-CM | POA: Diagnosis not present

## 2019-12-16 DIAGNOSIS — O24410 Gestational diabetes mellitus in pregnancy, diet controlled: Secondary | ICD-10-CM

## 2019-12-16 DIAGNOSIS — O24415 Gestational diabetes mellitus in pregnancy, controlled by oral hypoglycemic drugs: Secondary | ICD-10-CM | POA: Insufficient documentation

## 2019-12-16 DIAGNOSIS — O09299 Supervision of pregnancy with other poor reproductive or obstetric history, unspecified trimester: Secondary | ICD-10-CM | POA: Diagnosis not present

## 2019-12-16 DIAGNOSIS — O2693 Pregnancy related conditions, unspecified, third trimester: Secondary | ICD-10-CM

## 2019-12-16 DIAGNOSIS — O365920 Maternal care for other known or suspected poor fetal growth, second trimester, not applicable or unspecified: Secondary | ICD-10-CM | POA: Diagnosis not present

## 2019-12-16 DIAGNOSIS — O99113 Other diseases of the blood and blood-forming organs and certain disorders involving the immune mechanism complicating pregnancy, third trimester: Secondary | ICD-10-CM | POA: Diagnosis not present

## 2019-12-16 DIAGNOSIS — O99213 Obesity complicating pregnancy, third trimester: Secondary | ICD-10-CM

## 2019-12-16 DIAGNOSIS — D530 Protein deficiency anemia: Secondary | ICD-10-CM

## 2019-12-16 DIAGNOSIS — Z3A37 37 weeks gestation of pregnancy: Secondary | ICD-10-CM

## 2019-12-19 ENCOUNTER — Ambulatory Visit: Payer: Medicaid Other

## 2019-12-19 ENCOUNTER — Ambulatory Visit: Payer: Medicaid Other | Attending: Obstetrics and Gynecology

## 2019-12-20 ENCOUNTER — Telehealth: Payer: Self-pay | Admitting: *Deleted

## 2019-12-20 NOTE — Telephone Encounter (Signed)
Spoke with Angel [name] today regarding her insulin and ins coverage. Asked Angel [name] to contact her ins carrier to find out what insulins are covered so that we may order the correct one.  Angel [name] is to make office aware once she finds out.   Message also sent to Dr Jolayne Panther to make her aware of plan.

## 2019-12-22 ENCOUNTER — Encounter (HOSPITAL_COMMUNITY): Payer: Self-pay | Admitting: *Deleted

## 2019-12-22 ENCOUNTER — Ambulatory Visit (INDEPENDENT_AMBULATORY_CARE_PROVIDER_SITE_OTHER): Payer: Medicaid Other | Admitting: Obstetrics and Gynecology

## 2019-12-22 ENCOUNTER — Telehealth (HOSPITAL_COMMUNITY): Payer: Self-pay | Admitting: *Deleted

## 2019-12-22 ENCOUNTER — Encounter: Payer: Self-pay | Admitting: Obstetrics and Gynecology

## 2019-12-22 ENCOUNTER — Other Ambulatory Visit: Payer: Self-pay

## 2019-12-22 VITALS — BP 110/73 | HR 102 | Wt 300.0 lb

## 2019-12-22 DIAGNOSIS — O99113 Other diseases of the blood and blood-forming organs and certain disorders involving the immune mechanism complicating pregnancy, third trimester: Secondary | ICD-10-CM

## 2019-12-22 DIAGNOSIS — D6859 Other primary thrombophilia: Secondary | ICD-10-CM

## 2019-12-22 DIAGNOSIS — Z3A37 37 weeks gestation of pregnancy: Secondary | ICD-10-CM

## 2019-12-22 DIAGNOSIS — O09299 Supervision of pregnancy with other poor reproductive or obstetric history, unspecified trimester: Secondary | ICD-10-CM

## 2019-12-22 DIAGNOSIS — O24414 Gestational diabetes mellitus in pregnancy, insulin controlled: Secondary | ICD-10-CM

## 2019-12-22 DIAGNOSIS — Z8673 Personal history of transient ischemic attack (TIA), and cerebral infarction without residual deficits: Secondary | ICD-10-CM

## 2019-12-22 DIAGNOSIS — O09293 Supervision of pregnancy with other poor reproductive or obstetric history, third trimester: Secondary | ICD-10-CM

## 2019-12-22 DIAGNOSIS — O99119 Other diseases of the blood and blood-forming organs and certain disorders involving the immune mechanism complicating pregnancy, unspecified trimester: Secondary | ICD-10-CM

## 2019-12-22 DIAGNOSIS — O09893 Supervision of other high risk pregnancies, third trimester: Secondary | ICD-10-CM

## 2019-12-22 DIAGNOSIS — O09899 Supervision of other high risk pregnancies, unspecified trimester: Secondary | ICD-10-CM

## 2019-12-22 NOTE — Telephone Encounter (Signed)
Preadmission screen  

## 2019-12-22 NOTE — Progress Notes (Signed)
Pt presents for routine ROB CBG's readings available on pt's phone Novolog inj. finally approved by insurance. Pt will pick up.

## 2019-12-22 NOTE — Patient Instructions (Signed)

## 2019-12-22 NOTE — Progress Notes (Signed)
   PRENATAL VISIT NOTE  Subjective:  Angel [name] is a 29 y.o. G3P2002 at [redacted]w[redacted]d being seen today for ongoing prenatal care.  She is currently monitored for the following issues for this high-risk pregnancy and has Migraine; Morbid obesity (HCC); History of ischemic stroke x2 in 2015; Anemia; Protein S deficiency (HCC); Supervision of other high risk pregnancy, antepartum; Protein S deficiency affecting pregnancy, antepartum (HCC); History of pre-eclampsia in prior pregnancy, currently pregnant; Maternal obesity affecting pregnancy, antepartum; and Insulin controlled gestational diabetes mellitus (GDM) in third trimester on their problem list.  Patient doing well with no acute concerns today. She reports no complaints.  Contractions: Irritability. Vag. Bleeding: None.  Movement: Present. Denies leaking of fluid.   Pt seen.  Prior note reviewed and patient was supposed to start on insulin but pt had insurance issues which were resolved today.   Blood sugars reviewed FBS:99-104   [id number],  Most are in 130s-140s Pt discussed in detail with Dr. Parke Poisson, MFM.  He agrees taht with blood sugars not fully controlled, delivery after 38 weeks is acceptable.Pt has been informed.  The following portions of the patient's history were reviewed and updated as appropriate: allergies, current medications, past family history, past medical history, past social history, past surgical history and problem list. Problem list updated.  Objective:   Vitals:   12/22/19 0841  BP: 110/73  Pulse: 102  Weight: (!) 300 lb (136.1 kg)    Fetal Status: Fetal Heart Rate (bpm): 156   Movement: Present     General:  Alert, oriented and cooperative. Patient is in no acute distress.  Skin: Skin is warm and dry. No rash noted.   Cardiovascular: Normal heart rate noted  Respiratory: Normal respiratory effort, no problems with respiration noted  Abdomen: Soft, obese, gravid, appropriate for gestational age.  Pain/Pressure:  Absent     Pelvic: Cervical exam performed Dilation: 2 Effacement (%): 50 Station: Ballotable  Extremities: Normal range of motion.  Edema: None  Mental Status:  Normal mood and affect. Normal behavior. Normal judgment and thought content.   Assessment and Plan:  Pregnancy: G3P2002 at [redacted]w[redacted]d  1. Supervision of other high risk pregnancy, antepartum IOL scheduled for 12/24/2019, Dr. Jolayne Panther will be attending  2. Insulin controlled gestational diabetes mellitus (GDM) in third trimester Pt now has insulin ordered, but will be induced shortly, reassess blood sugars after delivery  3. Protein S deficiency affecting pregnancy, antepartum (HCC)   4. Morbid obesity (HCC)   5. History of ischemic stroke x2 in 2015 Last dose of lovenox on 7/30 AM, will check coags at time of IOL  6. History of pre-eclampsia in prior pregnancy, currently pregnant BP WNL, excellent  Term labor symptoms and general obstetric precautions including but not limited to vaginal bleeding, contractions, leaking of fluid and fetal movement were reviewed in detail with the patient.  Please refer to After Visit Summary for other counseling recommendations.   No follow-ups on file.   Mariel Aloe, MD

## 2019-12-23 ENCOUNTER — Other Ambulatory Visit (HOSPITAL_COMMUNITY)
Admission: RE | Admit: 2019-12-23 | Discharge: 2019-12-23 | Disposition: A | Discharge status: Home / Self Care | Payer: Medicaid Other | Source: Ambulatory Visit | Attending: Family Medicine | Admitting: Family Medicine

## 2019-12-23 ENCOUNTER — Other Ambulatory Visit: Payer: Self-pay | Admitting: Advanced Practice Midwife

## 2019-12-23 DIAGNOSIS — Z01812 Encounter for preprocedural laboratory examination: Secondary | ICD-10-CM | POA: Insufficient documentation

## 2019-12-23 DIAGNOSIS — Z20822 Contact with and (suspected) exposure to covid-19: Secondary | ICD-10-CM | POA: Insufficient documentation

## 2019-12-23 LAB — SARS CORONAVIRUS 2 (TAT 6-24 HRS): SARS Coronavirus 2: NEGATIVE

## 2019-12-24 ENCOUNTER — Inpatient Hospital Stay (HOSPITAL_COMMUNITY): Payer: Medicaid Other | Admitting: Anesthesiology

## 2019-12-24 ENCOUNTER — Inpatient Hospital Stay (HOSPITAL_COMMUNITY): Payer: Medicaid Other

## 2019-12-24 ENCOUNTER — Other Ambulatory Visit: Payer: Self-pay

## 2019-12-24 ENCOUNTER — Inpatient Hospital Stay (HOSPITAL_COMMUNITY)
Admission: AD | Admit: 2019-12-24 | Discharge: 2019-12-26 | DRG: 806 | Disposition: A | Discharge status: Home / Self Care | Payer: Medicaid Other | Attending: Obstetrics and Gynecology | Admitting: Obstetrics and Gynecology

## 2019-12-24 ENCOUNTER — Encounter (HOSPITAL_COMMUNITY): Payer: Self-pay | Admitting: Obstetrics and Gynecology

## 2019-12-24 DIAGNOSIS — Z30430 Encounter for insertion of intrauterine contraceptive device: Secondary | ICD-10-CM | POA: Diagnosis not present

## 2019-12-24 DIAGNOSIS — O09899 Supervision of other high risk pregnancies, unspecified trimester: Secondary | ICD-10-CM

## 2019-12-24 DIAGNOSIS — D62 Acute posthemorrhagic anemia: Secondary | ICD-10-CM | POA: Diagnosis not present

## 2019-12-24 DIAGNOSIS — O9912 Other diseases of the blood and blood-forming organs and certain disorders involving the immune mechanism complicating childbirth: Secondary | ICD-10-CM | POA: Diagnosis not present

## 2019-12-24 DIAGNOSIS — O99210 Obesity complicating pregnancy, unspecified trimester: Secondary | ICD-10-CM | POA: Diagnosis present

## 2019-12-24 DIAGNOSIS — O24424 Gestational diabetes mellitus in childbirth, insulin controlled: Secondary | ICD-10-CM | POA: Diagnosis not present

## 2019-12-24 DIAGNOSIS — Z87891 Personal history of nicotine dependence: Secondary | ICD-10-CM

## 2019-12-24 DIAGNOSIS — Z20822 Contact with and (suspected) exposure to covid-19: Secondary | ICD-10-CM | POA: Diagnosis present

## 2019-12-24 DIAGNOSIS — O09299 Supervision of pregnancy with other poor reproductive or obstetric history, unspecified trimester: Secondary | ICD-10-CM | POA: Diagnosis present

## 2019-12-24 DIAGNOSIS — Z8673 Personal history of transient ischemic attack (TIA), and cerebral infarction without residual deficits: Secondary | ICD-10-CM

## 2019-12-24 DIAGNOSIS — G43909 Migraine, unspecified, not intractable, without status migrainosus: Secondary | ICD-10-CM | POA: Diagnosis present

## 2019-12-24 DIAGNOSIS — Z3A38 38 weeks gestation of pregnancy: Secondary | ICD-10-CM

## 2019-12-24 DIAGNOSIS — O24414 Gestational diabetes mellitus in pregnancy, insulin controlled: Secondary | ICD-10-CM | POA: Diagnosis present

## 2019-12-24 DIAGNOSIS — O9081 Anemia of the puerperium: Secondary | ICD-10-CM | POA: Diagnosis not present

## 2019-12-24 DIAGNOSIS — O99214 Obesity complicating childbirth: Secondary | ICD-10-CM | POA: Diagnosis not present

## 2019-12-24 DIAGNOSIS — D649 Anemia, unspecified: Secondary | ICD-10-CM | POA: Diagnosis present

## 2019-12-24 DIAGNOSIS — Z7901 Long term (current) use of anticoagulants: Secondary | ICD-10-CM

## 2019-12-24 DIAGNOSIS — Z975 Presence of (intrauterine) contraceptive device: Secondary | ICD-10-CM

## 2019-12-24 DIAGNOSIS — D6859 Other primary thrombophilia: Secondary | ICD-10-CM | POA: Diagnosis not present

## 2019-12-24 DIAGNOSIS — O9902 Anemia complicating childbirth: Secondary | ICD-10-CM | POA: Diagnosis not present

## 2019-12-24 LAB — GLUCOSE, CAPILLARY
Glucose-Capillary: 119 mg/dL — ABNORMAL HIGH (ref 70–99)
Glucose-Capillary: 93 mg/dL (ref 70–99)
Glucose-Capillary: 80 mg/dL (ref 70–99)
Glucose-Capillary: 64 mg/dL — ABNORMAL LOW (ref 70–99)
Glucose-Capillary: 65 mg/dL — ABNORMAL LOW (ref 70–99)
Glucose-Capillary: 84 mg/dL (ref 70–99)

## 2019-12-24 LAB — CBC
HCT: 32.1 % — ABNORMAL LOW (ref 36.0–46.0)
Hemoglobin: 10 g/dL — ABNORMAL LOW (ref 12.0–15.0)
MCH: 24.6 pg — ABNORMAL LOW (ref 26.0–34.0)
MCHC: 31.2 g/dL (ref 30.0–36.0)
MCV: 78.9 fL — ABNORMAL LOW (ref 80.0–100.0)
Platelets: 329 10*3/uL (ref 150–400)
RBC: 4.07 MIL/uL (ref 3.87–5.11)
RDW: 16.4 % — ABNORMAL HIGH (ref 11.5–15.5)
WBC: 9.2 10*3/uL (ref 4.0–10.5)
nRBC: 0 % (ref 0.0–0.2)

## 2019-12-24 LAB — ABO/RH: ABO/RH(D): A POS

## 2019-12-24 LAB — PROTIME-INR
INR: 1.1 (ref 0.8–1.2)
Prothrombin Time: 13.5 seconds (ref 11.4–15.2)

## 2019-12-24 LAB — TYPE AND SCREEN
ABO/RH(D): A POS
Antibody Screen: NEGATIVE

## 2019-12-24 LAB — FIBRINOGEN: Fibrinogen: 659 mg/dL — ABNORMAL HIGH (ref 210–475)

## 2019-12-24 LAB — APTT: aPTT: 32 seconds (ref 24–36)

## 2019-12-24 MED ORDER — OXYTOCIN-SODIUM CHLORIDE 30-0.9 UT/500ML-% IV SOLN
2.5000 [IU]/h | INTRAVENOUS | Status: DC
  Filled 2019-12-24: qty 500

## 2019-12-24 MED ORDER — FENTANYL CITRATE (PF) 100 MCG/2ML IJ SOLN: 50.0000 ug | INTRAMUSCULAR | Status: DC | PRN

## 2019-12-24 MED ORDER — SOD CITRATE-CITRIC ACID 500-334 MG/5ML PO SOLN: 30.0000 mL | ORAL | Status: DC | PRN

## 2019-12-24 MED ORDER — LACTATED RINGERS IV SOLN
500.0000 mL | Freq: Once | INTRAVENOUS | Status: AC
  Administered 2019-12-24: 500 mL via INTRAVENOUS

## 2019-12-24 MED ORDER — TERBUTALINE SULFATE 1 MG/ML IJ SOLN
0.2500 mg | Freq: Once | INTRAMUSCULAR | Status: DC | PRN
  Filled 2019-12-24: qty 1

## 2019-12-24 MED ORDER — TERBUTALINE SULFATE 1 MG/ML IJ SOLN: 0.2500 mg | Freq: Once | INTRAMUSCULAR | Status: DC | PRN

## 2019-12-24 MED ORDER — OXYTOCIN BOLUS FROM INFUSION
333.0000 mL | Freq: Once | INTRAVENOUS | Status: AC
  Administered 2019-12-24: 333 mL via INTRAVENOUS

## 2019-12-24 MED ORDER — FENTANYL CITRATE (PF) 100 MCG/2ML IJ SOLN
100.0000 ug | INTRAMUSCULAR | Status: DC | PRN
  Administered 2019-12-24: 100 ug via INTRAVENOUS
  Filled 2019-12-24: qty 2

## 2019-12-24 MED ORDER — FENTANYL-BUPIVACAINE-NACL 0.5-0.125-0.9 MG/250ML-% EP SOLN
12.0000 mL/h | EPIDURAL | Status: DC | PRN
  Filled 2019-12-24: qty 250

## 2019-12-24 MED ORDER — PHENYLEPHRINE 40 MCG/ML (10ML) SYRINGE FOR IV PUSH (FOR BLOOD PRESSURE SUPPORT)
80.0000 ug | PREFILLED_SYRINGE | INTRAVENOUS | Status: DC | PRN
  Filled 2019-12-24: qty 10

## 2019-12-24 MED ORDER — LEVONORGESTREL 19.5 MCG/DAY IU IUD
INTRAUTERINE_SYSTEM | Freq: Once | INTRAUTERINE | Status: AC
  Administered 2019-12-24: 22:00:00 1 via INTRAUTERINE
  Filled 2019-12-24: qty 1

## 2019-12-24 MED ORDER — EPHEDRINE 5 MG/ML INJ: 10.0000 mg | INTRAVENOUS | Status: DC | PRN

## 2019-12-24 MED ORDER — EPHEDRINE 5 MG/ML INJ
10.0000 mg | INTRAVENOUS | Status: AC | PRN
  Administered 2019-12-24 (×2): 10 mg via INTRAVENOUS
  Filled 2019-12-24: qty 10

## 2019-12-24 MED ORDER — MISOPROSTOL 50MCG HALF TABLET
ORAL_TABLET | ORAL | Status: AC
  Administered 2019-12-24: 25 ug via VAGINAL
  Filled 2019-12-24: qty 1

## 2019-12-24 MED ORDER — LACTATED RINGERS IV SOLN: INTRAVENOUS | Status: DC

## 2019-12-24 MED ORDER — TERBUTALINE SULFATE 1 MG/ML IJ SOLN
0.2500 mg | Freq: Once | INTRAMUSCULAR | Status: AC | PRN
  Administered 2019-12-24: 0.25 mg via SUBCUTANEOUS

## 2019-12-24 MED ORDER — ONDANSETRON HCL 4 MG/2ML IJ SOLN: 4.0000 mg | Freq: Four times a day (QID) | INTRAMUSCULAR | Status: DC | PRN

## 2019-12-24 MED ORDER — OXYTOCIN-SODIUM CHLORIDE 30-0.9 UT/500ML-% IV SOLN
1.0000 m[IU]/min | INTRAVENOUS | Status: DC
  Administered 2019-12-24: 2 m[IU]/min via INTRAVENOUS

## 2019-12-24 MED ORDER — DEXTROSE 50 % IV SOLN: 1.0000 | Freq: Once | INTRAVENOUS | Status: DC

## 2019-12-24 MED ORDER — LIDOCAINE HCL (PF) 1 % IJ SOLN
30.0000 mL | INTRAMUSCULAR | Status: AC | PRN
  Administered 2019-12-24: 30 mL via SUBCUTANEOUS
  Filled 2019-12-24: qty 30

## 2019-12-24 MED ORDER — DIPHENHYDRAMINE HCL 50 MG/ML IJ SOLN: 12.5000 mg | INTRAMUSCULAR | Status: DC | PRN

## 2019-12-24 MED ORDER — MISOPROSTOL 25 MCG QUARTER TABLET: 25.0000 ug | ORAL_TABLET | ORAL | Status: DC | PRN

## 2019-12-24 MED ORDER — LACTATED RINGERS AMNIOINFUSION: INTRAVENOUS | Status: DC

## 2019-12-24 MED ORDER — ACETAMINOPHEN 325 MG PO TABS: 650.0000 mg | ORAL_TABLET | ORAL | Status: DC | PRN

## 2019-12-24 MED ORDER — SODIUM CHLORIDE (PF) 0.9 % IJ SOLN
INTRAMUSCULAR | Status: DC | PRN
  Administered 2019-12-24: 12 mL/h via EPIDURAL

## 2019-12-24 MED ORDER — PHENYLEPHRINE 40 MCG/ML (10ML) SYRINGE FOR IV PUSH (FOR BLOOD PRESSURE SUPPORT)
80.0000 ug | PREFILLED_SYRINGE | INTRAVENOUS | Status: DC | PRN
  Administered 2019-12-24 (×2): 80 ug via INTRAVENOUS

## 2019-12-24 MED ORDER — OXYCODONE-ACETAMINOPHEN 5-325 MG PO TABS: 1.0000 | ORAL_TABLET | ORAL | Status: DC | PRN

## 2019-12-24 MED ORDER — LIDOCAINE HCL (PF) 1 % IJ SOLN
INTRAMUSCULAR | Status: DC | PRN
  Administered 2019-12-24: 10 mL via EPIDURAL
  Administered 2019-12-24: 2 mL via EPIDURAL

## 2019-12-24 MED ORDER — LACTATED RINGERS IV SOLN
500.0000 mL | INTRAVENOUS | Status: DC | PRN
  Administered 2019-12-24: 500 mL via INTRAVENOUS

## 2019-12-24 NOTE — Progress Notes (Signed)
Labor Progress Note Angel [name] is a 29 y.o. G3P2002 at [redacted]w[redacted]d presented for IUP at [redacted]w[redacted]d presenting for IOL 2/2 A2GDM. Labor progressing well.  S: Pt reports increasing intensity with contractions. Pt prefers AROM at this time and will defer epidural for now. No headache, vision changes or other concerns.  O:  BP (!) 130/69   Pulse 69   Temp 98.4 F (36.9 C) (Oral)   Resp 16   Ht 5\' 9"  (1.753 m)   Wt (!) 135.4 kg   LMP 04/01/2019   BMI 44.10 kg/m  EFM: BL 145, moderate variability, +accels, several variable decels s/p AROM now resolved  CVE: Dilation: 5 Effacement (%): 70 Station: -2 Presentation: Vertex Exam by:: Dr. [email or web address]   A&P: 29 y.o. 30 [redacted]w[redacted]d presented for IUP at [redacted]w[redacted]d presenting for IOL 2/2 A2GDM. #Labor: Progressing well. S/p FB+cytotec x1. AROM at 1730 for clear fluid. Will continue to up-titrate pitocin. #Pain: plan for epidural #FWB: BL 145, moderate variability, +accels, several variables s/p AROM now resolved. #GBS negative #A2GDM on insulin: on admission reports fasting sugars have been >100, postprandials >130. CBGs q4h, q2h when active. Consider SSI for abnormal CBGS, if consistent consider endoTool. Recent BG levels: 119 > 93 > 80. CTM. #H/o ischemic stroke x2 in 2015 most likely 2/2 Protein S deficiency: On lovenox, last dose 7/29. Continue to monitor. #H/o pre-eclampsia: Pt continues to have normal range BPs without symptoms. Will consider Pre-E labs if BP elevated.  8/29, MD 5:36 PM

## 2019-12-24 NOTE — Anesthesia Preprocedure Evaluation (Addendum)
Anesthesia Evaluation  Patient identified by MRN, date of birth, ID band Patient awake    Reviewed: Allergy & Precautions, NPO status , Patient's Chart, lab work & pertinent test results  Airway Mallampati: II  TM Distance: >3 FB Neck ROM: Full    Dental no notable dental hx.    Pulmonary former smoker,    Pulmonary exam normal breath sounds clear to auscultation       Cardiovascular negative cardio ROS Normal cardiovascular exam Rhythm:Regular Rate:Normal     Neuro/Psych  Headaches, PSYCHIATRIC DISORDERS Anxiety CVA x 2 in 2015 CVA, No Residual Symptoms    GI/Hepatic Neg liver ROS, GERD  Medicated and Controlled,  Endo/Other  diabetes, Gestational, Insulin Dependent, Oral Hypoglycemic AgentsMorbid obesityBMI 44  Renal/GU negative Renal ROS  negative genitourinary   Musculoskeletal negative musculoskeletal ROS (+)   Abdominal (+) + obese,   Peds negative pediatric ROS (+)  Hematology  (+) Blood dyscrasia, anemia , hct 32.1, plt 329 Protein S deficiency, has been off lovenox x 48h   Anesthesia Other Findings   Reproductive/Obstetrics (+) Pregnancy                            Anesthesia Physical Anesthesia Plan  ASA: III and emergent  Anesthesia Plan: Epidural   Post-op Pain Management:    Induction:   PONV Risk Score and Plan: 2  Airway Management Planned: Natural Airway  Additional Equipment: None  Intra-op Plan:   Post-operative Plan:   Informed Consent: I have reviewed the patients History and Physical, chart, labs and discussed the procedure including the risks, benefits and alternatives for the proposed anesthesia with the patient or authorized representative who has indicated his/her understanding and acceptance.       Plan Discussed with:   Anesthesia Plan Comments:         Anesthesia Quick Evaluation

## 2019-12-24 NOTE — Anesthesia Procedure Notes (Signed)
Epidural Patient location during procedure: OB Start time: 12/24/2019 6:07 PM End time: 12/24/2019 6:20 PM  Staffing Anesthesiologist: Lannie Fields, DO Performed: anesthesiologist   Preanesthetic Checklist Completed: patient identified, IV checked, risks and benefits discussed, monitors and equipment checked, pre-op evaluation and timeout performed  Epidural Patient position: sitting Prep: DuraPrep and site prepped and draped Patient monitoring: continuous pulse ox, blood pressure, heart rate and cardiac monitor Approach: midline Location: L3-L4 Injection technique: LOR air  Needle:  Needle type: Tuohy  Needle gauge: 17 G Needle length: 9 cm Needle insertion depth: 8.5 cm Catheter type: closed end flexible Catheter size: 19 Gauge Catheter at skin depth: 15 cm Test dose: negative  Assessment Sensory level: T8 Events: blood not aspirated, injection not painful, no injection resistance, no paresthesia and negative IV test  Additional Notes Patient identified. Risks/Benefits/Options discussed with patient including but not limited to bleeding, infection, nerve damage, paralysis, failed block, incomplete pain control, headache, blood pressure changes, nausea, vomiting, reactions to medication both or allergic, itching and postpartum back pain. Confirmed with bedside nurse the patient's most recent platelet count. Confirmed with patient that they are not currently taking any anticoagulation, have any bleeding history or any family history of bleeding disorders. Patient expressed understanding and wished to proceed. All questions were answered. Sterile technique was used throughout the entire procedure. Please see nursing notes for vital signs. Test dose was given through epidural catheter and negative prior to continuing to dose epidural or start infusion. Warning signs of high block given to the patient including shortness of breath, tingling/numbness in hands, complete motor  block, or any concerning symptoms with instructions to call for help. Patient was given instructions on fall risk and not to get out of bed. All questions and concerns addressed with instructions to call with any issues or inadequate analgesia.  Reason for block:procedure for pain

## 2019-12-24 NOTE — H&P (Signed)
OBSTETRIC ADMISSION HISTORY AND PHYSICAL  Angel [name] is a 29 y.o. female G3P2002 with IUP at 81w1dpresenting for IOL 2/2 A2GDM. She reports +FMs. No LOF, VB, blurry vision, headaches, peripheral edema, or RUQ pain. She plans on breastfeeding. She requests post placental IUD for birth control.  She reports that her fasting sugars have been >100 and pospranidals have been >130 recently.  Dating: By LMP --->  Estimated Date of Delivery: 01/06/20  Sono:   _0 , normal anatomy, footling breech presentation, 2183g, 40%ile, EFW 41#54-cephalic on BPP 70/08/67 Prenatal History/Complications: H/o Protein S deficiency H/o ischemic stroke x2 in 2015 Anemia Morbid obesity Migraines H/o pre-eclampsia in prior pregnancy  Past Medical History: Past Medical History:  Diagnosis Date  . [place]13[place]  . Anemia   . Anxiety   . Gestational diabetes   . Headache   . Hyperlipidemia   . Migraine 03/13/2014  . Morbid [place] ([place] 03/13/2014  . Obesity   . Protein S deficiency ([facility]     Past Surgical History: Past Surgical History:  Procedure Laterality Date  . none    . TEE WITHOUT CARDIOVERSION N/A 05/17/2014   Procedure: TRANSESOPHAGEAL ECHOCARDIOGRAM (TEE);  Surgeon: PJosue Hector MD;  [facility]  Service: Cardiovascular;  Laterality: N/A;    Obstetrical History: OB History    Gravida  3   Para  2   Term  2   Preterm      AB      Living  2     SAB      TAB      Ectopic      Multiple      Live Births  2           Social History: Social History   Socioeconomic History  . Marital status: Married    Spouse name: Not on file  . Number of children: 1  . Years of education: college  . Highest education level: Not on file  Occupational History    Employer: OTHER    Comment: [employer]  Tobacco Use  . Smoking status: Former SResearch scientist (life sciences) . Smokeless tobacco: Never Used  Vaping Use  . Vaping Use: Never used   Substance and Sexual Activity  . Alcohol use: Not Currently    Alcohol/week: 0.0 standard drinks    Comment: Occasionally.  . Drug use: No  . Sexual activity: Yes  Other Topics Concern  . Not on file  Social History Narrative   Patient lives at home with family.   Caffeine Use: 2 sodas daily   Social Determinants of Health   Financial Resource Strain:   . Difficulty of Paying Living Expenses:   Food Insecurity:   . Worried About RCharity fundraiserin the Last Year:   . RArboriculturistin the Last Year:   Transportation Needs:   . LFilm/video editor(Medical):   .Marland KitchenLack of Transportation (Non-Medical):   Physical Activity:   . Days of Exercise per Week:   . Minutes of Exercise per Session:   Stress:   . Feeling of Stress :   Social Connections:   . Frequency of Communication with Friends and Family:   . Frequency of Social Gatherings with Friends and Family:   . Attends Religious Services:   . Active Member of Clubs or Organizations:   . Attends CArchivistMeetings:   .Marland KitchenMarital Status:     Family History:  Family History  Problem Relation Age of Onset  . Cancer Paternal Uncle   . Diabetes Paternal Uncle   . Heart disease Maternal Grandmother   . Heart disease Maternal Grandfather   . Diabetes Paternal Grandmother     Allergies: Not on File  Medications Prior to Admission  Medication Sig Dispense Refill Last Dose  . aspirin 81 MG chewable tablet Chew 1 tablet (81 mg total) by mouth daily. 30 tablet 6 12/23/2019 at Unknown time  . Accu-Chek Softclix Lancets lancets 1 each by Other route 4 (four) times daily. Use as instructed 100 each 12   . Blood Glucose Monitoring Suppl (ACCU-CHEK GUIDE) w/Device KIT 1 Device by Does not apply route 4 (four) times daily. 1 kit 0   . Blood Pressure Monitoring (BLOOD PRESSURE KIT) DEVI 1 kit by Does not apply route as needed. 1 each 0   . Butalbital-APAP-Caffeine 50-325-40 MG capsule Take 1 capsule by mouth every 6  (six) hours as needed for headache. (Patient not taking: Reported on 12/22/2019) 30 capsule 3   . cyclobenzaprine (FLEXERIL) 10 MG tablet Take 1 tablet (10 mg total) by mouth 3 (three) times daily as needed for muscle spasms. (Patient not taking: Reported on 07/12/2019) 30 tablet 0   . diphenhydrAMINE (BENADRYL) 25 MG tablet Take 25 mg by mouth at bedtime as needed. (Patient not taking: Reported on 11/21/2019)     . enoxaparin (LOVENOX) 40 MG/0.4ML injection Inject 0.4 mLs (40 mg total) into the skin daily. 12 mL 6 12/22/2019  . glucose blood (ACCU-CHEK GUIDE) test strip Use to check blood sugars four times a day was instructed 50 each 12   . glyBURIDE (DIABETA) 2.5 MG tablet Take 1 tablet (2.5 mg total) by mouth 2 (two) times daily with a meal. (Patient not taking: Reported on 12/16/2019) 60 tablet 3   . insulin aspart (NOVOLOG) 100 UNIT/ML injection Inject 22 Units into the skin 3 (three) times daily before meals. (Patient not taking: Reported on 12/22/2019) 10 mL 12   . insulin NPH Human (NOVOLIN N) 100 UNIT/ML injection 59 units subcutaneous in the morning and 22.5 units at bedtime 10 mL 3   . Insulin Syringe-Needle U-100 26G X 1/2" 1 ML MISC Use as directed for insulin administration 100 each 0   . metFORMIN (GLUCOPHAGE) 500 MG tablet Take 2 tablets (1,000 mg total) by mouth 2 (two) times daily with a meal. (Patient not taking: Reported on 12/14/2019) 90 tablet 0   . omeprazole (PRILOSEC) 40 MG capsule Take 1 capsule (40 mg total) by mouth daily. 30 capsule 3   . Prenat-Fe Poly-Methfol-FA-DHA (VITAFOL ULTRA) 29-0.6-0.4-200 MG CAPS Take 1 capsule by mouth daily before breakfast. 90 capsule 4   . Prenatal Vit-Fe Fumarate-FA (MULTIVITAMIN-PRENATAL) 27-0.8 MG TABS tablet Take 1 tablet by mouth daily at 12 noon.  (Patient not taking: Reported on 12/14/2019)        Review of Systems:  All systems reviewed and negative except as stated in HPI  PE: Blood pressure 110/72, pulse (!) 133, temperature 97.9 F  (36.6 C), temperature source Oral, resp. rate 16, height _0  (1.753 m), weight (!) 135.4 kg, last menstrual period 04/01/2019. General appearance: alert and cooperative Lungs: regular rate and effort Heart: regular rate  Abdomen: soft, non-tender Extremities: Homans sign is negative, no sign of DVT Presentation: cephalic EFM: 544 bpm, moderate variability, 15x15 accels, one variable decel Toco: irregular, 2-7 min  SVE: 2/40/-3  Prenatal labs: ABO, Rh: --/--/PENDING (07/31 1028) Antibody: PENDING (  07/31 1028) Rubella: 5.68 (03/19 1026) RPR: Non Reactive (05/03 0912)  HBsAg: Negative (03/19 1026)  HIV: Non Reactive (05/03 0912)  GBS: Negative/-- (07/15 1056)  2 hr GTT 93/127/92  Prenatal Transfer Tool  Maternal Diabetes: Yes:  Diabetes Type:  Insulin/Medication controlled Genetic Screening: Normal Maternal Ultrasounds/Referrals: Normal Fetal Ultrasounds or other Referrals:  Referred to Materal Fetal Medicine  Maternal Substance Abuse:  No Significant Maternal Medications:  None Significant Maternal Lab Results: Group B Strep negative  Results for orders placed or performed during the hospital encounter of 12/24/19 (from the past 24 hour(s))  Glucose, capillary   Collection Time: 12/24/19  9:37 AM  Result Value Ref Range   Glucose-Capillary 119 (H) 70 - 99 mg/dL  CBC   Collection Time: 12/24/19 10:28 AM  Result Value Ref Range   WBC 9.2 4.0 - 10.5 K/uL   RBC 4.07 3.87 - 5.11 MIL/uL   Hemoglobin 10.0 (L) 12.0 - 15.0 g/dL   HCT 32.1 (L) 36 - 46 %   MCV 78.9 (L) 80.0 - 100.0 fL   MCH 24.6 (L) 26.0 - 34.0 pg   MCHC 31.2 30.0 - 36.0 g/dL   RDW 16.4 (H) 11.5 - 15.5 %   Platelets 329 150 - 400 K/uL   nRBC 0.0 0.0 - 0.2 %  APTT   Collection Time: 12/24/19 10:28 AM  Result Value Ref Range   aPTT 32 24 - 36 seconds  Fibrinogen   Collection Time: 12/24/19 10:28 AM  Result Value Ref Range   Fibrinogen 659 (H) 210 - 475 mg/dL  Protime-INR   Collection Time: 12/24/19 10:28  AM  Result Value Ref Range   Prothrombin Time 13.5 11.4 - 15.2 seconds   INR 1.1 0.8 - 1.2  Type and screen   Collection Time: 12/24/19 10:28 AM  Result Value Ref Range   ABO/RH(D) PENDING    Antibody Screen PENDING    Sample Expiration      12/27/2019,2359 Performed at [facility], [address] [place], [place] [address]     Patient Active Problem List   Diagnosis Date Noted  . Insulin controlled gestational diabetes mellitus (GDM) in third trimester 09/27/2019  . Protein S deficiency affecting pregnancy, antepartum ([place]) 09/12/2019  . History of pre-eclampsia in prior pregnancy, currently pregnant 09/12/2019  . Maternal obesity affecting pregnancy, antepartum 09/12/2019  . Supervision of other high risk pregnancy, antepartum 07/12/2019  . Protein S deficiency ([place])   . History of ischemic stroke x2 in 2015 05/13/2014  . Anemia   . Migraine 03/13/2014  . Morbid obesity ([place]) 03/13/2014    Assessment: Angel [name] is a 29 y.o. G3P2002 at 20w1dhere for IOL 2/2 A2GDM on insulin  1. Labor: Bishop score 3. FB placed with ease by Dr. GAstrid Drafts Start with cytotec. Consider more cytotec v pitocin at next check 2. FWB: Cat I, EFW 7#10. Pelvis proven to 7#8 3. Pain: per patient request 4. GBS: negative 5. A2GDM on insulin: reports fasting sugars have been >100, postprandials >130. CBGs q4h, q2h when active. Consider SSI for abnormal CBGS, if consistent consider endoTool 6. H/o ischemic stroke x2 in 2015 most likely 2/2 Protein S deficiency: On lovenox, last dose Thursday. Continue to monitor. 7. H/o pre-eclampsia: BP normotensive on admission, consider Pre-E labs if BP elevated   Plan: Admit to L&D  Risks and benefits of induction were reviewed, including failure of method, prolonged labor, need for further intervention, risk of cesarean.  Patient and family seem to  understand these risks and wish to proceed. Options of cytotec, foley bulb, AROM, and pitocin reviewed,  with use of each discussed.  Angel Bontempo L Scarlet Abad, DO  12/24/2019, 11:20 AM

## 2019-12-24 NOTE — Procedures (Signed)
  Post-Placental IUD Insertion Procedure Note  Patient identified, informed consent signed prior to delivery, signed copy in chart, time out was performed.    Vaginal, labial and perineal areas thoroughly inspected for lacerations. Bilateral periurethral degree laceration identified, repaired after insertion of Liletta IUD.   - IUD grasped between sterile gloved fingers. Sterile lubrication applied to sterile gloved hand for ease of insertion. Fundus identified through abdominal wall using non-insertion hand. IUD inserted to fundus with bimanual technique. IUD carefully released at the fundus and insertion hand gently removed from vagina.    Strings trimmed to the level of the cervical os. Patient tolerated procedure well.  Lot # [id number] Expiration Date12/05/2022

## 2019-12-24 NOTE — Progress Notes (Signed)
Labor Progress Note Angel [name] is a 29 y.o. G3P2002 at [redacted]w[redacted]d presented for IUP at [redacted]w[redacted]d presenting for IOL 2/2 A2GDM.   S: Pt reports pain well controlled s/p placement of epidural. Reports slight fatigue in s/o low BG to 63 but no headache, vision changes or other concerns.  O:  BP 116/65   Pulse 86   Temp 98.4 F (36.9 C) (Oral)   Resp 18   Ht 5\' 9"  (1.753 m)   Wt (!) 135.4 kg   LMP 04/01/2019   BMI 44.10 kg/m  EFM: BL 140, moderate variability, +accels, recurrent variable decels s/p epidural  CVE: Dilation: 6 Effacement (%): 70 Station: -2 Presentation: Vertex Exam by:: Dr. [email or web address]   A&P: 29 y.o. 26 at [redacted]w[redacted]d presenting for IOL 2/2 A2GDM. #Labor: S/p FB+cytotec x1. AROM at 1730 for clear fluid. Pitocin started at 1530 but now holding given recurrent variable decels s/p epidural at 1817. FSE and IUPC with amnioinfusion now in place. Pt left-lying with peanut ball. Will continue to monitor and consider restarting pitocin in 1 hour if improved. #Pain: Epidural in place. #FWB: Category 2 strip given recurrent variable decels s/p epidural but reassuringly moderate variability with good accels and good recovery between decels. #GBS negative #A2GDM on insulin: on admission reports fasting sugars have been >100, postprandials >130. CBGs q4h, q2h when active. Consider SSI for abnormal CBGS, if consistent consider endoTool. Recent BG levels: 119 > 93 > 80 > 63 > amp of d5 >plan to recheck promptly. #H/o ischemic stroke x2 in 2015 most likely 2/2 Protein S deficiency: On lovenox, last dose 7/29. Continue to monitor. #H/o pre-eclampsia: Pt continues to have normal range BPs without symptoms. Will consider Pre-E labs if BP elevated.  8/29, MD 7:26 PM

## 2019-12-24 NOTE — Discharge Summary (Addendum)
Postpartum Discharge Summary     Patient Name: Angel [name] DOB: [birth year] MRN: [id number]  Date of admission: 12/24/2019 Delivery date:12/24/2019  Delivering provider: Clarnce Flock  Date of discharge: 12/26/2019  Admitting diagnosis: Insulin controlled gestational diabetes mellitus (GDM) in third trimester [O24.414] Intrauterine pregnancy: [redacted]w[redacted]d    Secondary diagnosis:  Active Problems:   Migraine   Morbid obesity ([place]   History of ischemic stroke x2 in 2015   Anemia   Protein S deficiency ([place]   Supervision of other high risk pregnancy, antepartum   Protein S deficiency affecting pregnancy, antepartum ([place]   History of pre-eclampsia in prior pregnancy, currently pregnant   Maternal obesity affecting pregnancy, antepartum   Insulin controlled gestational diabetes mellitus (GDM) in third trimester   IUD (intrauterine device) in place  Additional problems: None    Discharge diagnosis: Term Pregnancy Delivered and GDM A2                                              Post partum procedures:Post-placental Liletta IUD insertion Augmentation: AROM, Pitocin, Cytotec and IP Foley Complications: None  Hospital course: Induction of Labor With Vaginal Delivery   29y.o. yo [id number] 322w1das admitted to the hospital 12/24/2019 for induction of labor.  Indication for induction: A2 DM.  Patient had an uncomplicated labor course as follows: Patient arrived at 2 cm dilation and was induced with FB and misoprostol x1. After FB came out she was started on pitocin followed by AROM. She progressed to complete after approximately 10 hours and had uncomplicated NSVD. Membrane Rupture Time/Date: 5:31 PM ,12/24/2019   Delivery Method:Vaginal, Spontaneous  Episiotomy: None  Lacerations:  Periurethral  Details of delivery can be found in separate delivery note. Post placental Liletta IUD placed after delivery. Patient had a routine postpartum course. She was resumed on Lovenox at 24h post  partum due to history of Protein S deficiency with prior stroke x2. Per ACOG guidelines, this was changed to 150 mg daily as she has a lower risk coagulopathy requiring intermediate dosing of Lovenox. She was also on Aspirin 325 mg prior to pregnancy due to her history of ischemic stroke x2, which was continued at discharge. With her morbid obesity, A2GDM and higher risk of developing T2DM, she was started on Atorvastatin. She will follow up with her PCP in 6-12 weeks for cholesterol blood work, and to determine if she needs to continue statin therapy. Patient is discharged home 12/26/19.  Newborn Data: Birth date:12/24/2019  Birth time:9:46 PM  Gender:Female  Living status:Living  Apgars:9 ,9  Weight:2841 g   Magnesium Sulfate received: No BMZ received: No Rhophylac:N/A MMR:N/A T-DaP:Unknown Flu: No Transfusion:No  Physical exam  Vitals:   12/25/19 1355 12/25/19 2126 12/26/19 0537 12/26/19 1452  BP: 112/75 123/75 102/71 114/63  Pulse: 88 85 79 67  Resp: 19 18 18 18   Temp:  98.4 F (36.9 C) 98.1 F (36.7 C) 98 F (36.7 C)  TempSrc:  Oral Oral Oral  SpO2: 100% 97% 100%   Weight:      Height:       General: alert, cooperative and no distress Lochia: appropriate Uterine Fundus: firm Incision: N/A DVT Evaluation: No evidence of DVT seen on physical exam. Negative Homan's sign. No cords or calf tenderness. No significant calf/ankle edema. Labs: Lab Results  Component Value Date   WBC 9.2  12/24/2019   HGB 10.0 (L) 12/24/2019   HCT 32.1 (L) 12/24/2019   MCV 78.9 (L) 12/24/2019   PLT 329 12/24/2019   CMP Latest Ref Rng & Units 08/12/2019  Glucose 65 - 99 mg/dL 85  BUN 6 - 20 mg/dL 9  Creatinine 0.57 - 1.00 mg/dL 0.56(L)  Sodium 134 - 144 mmol/L 136  Potassium 3.5 - 5.2 mmol/L 4.0  Chloride 96 - 106 mmol/L 104  CO2 20 - 29 mmol/L 17(L)  Calcium 8.7 - 10.2 mg/dL 9.0  Total Protein 6.0 - 8.5 g/dL 6.9  Total Bilirubin 0.0 - 1.2 mg/dL <0.2  Alkaline Phos 39 - 117 IU/L 78   AST 0 - 40 IU/L 6  ALT 0 - 32 IU/L 6   Edinburgh Score: Edinburgh Postnatal Depression Scale Screening Tool 12/26/2019  I have been able to laugh and see the funny side of things. 0  I have looked forward with enjoyment to things. 0  I have blamed myself unnecessarily when things went wrong. 1  I have been anxious or worried for no good reason. 2  I have felt scared or panicky for no good reason. 0  Things have been getting on top of me. 1  I have been so unhappy that I have had difficulty sleeping. 0  I have felt sad or miserable. 0  I have been so unhappy that I have been crying. 0  The thought of harming myself has occurred to me. 0  Edinburgh Postnatal Depression Scale Total 4     After visit meds:  Allergies as of 12/26/2019   Not on File     Medication List    STOP taking these medications   Accu-Chek Guide test strip Generic drug: glucose blood   Accu-Chek Guide w/Device Kit   Accu-Chek Softclix Lancets lancets   aspirin 81 MG chewable tablet Replaced by: aspirin EC 325 MG tablet   Butalbital-APAP-Caffeine 50-325-40 MG capsule   cyclobenzaprine 10 MG tablet Commonly known as: FLEXERIL   diphenhydrAMINE 25 MG tablet Commonly known as: BENADRYL   enoxaparin 40 MG/0.4ML injection Commonly known as: LOVENOX Replaced by: enoxaparin 150 MG/ML injection   glyBURIDE 2.5 MG tablet Commonly known as: DIABETA   insulin aspart 100 UNIT/ML injection Commonly known as: novoLOG   insulin NPH Human 100 UNIT/ML injection Commonly known as: NOVOLIN N   Insulin Syringe-Needle U-100 26G X 1/2" 1 ML Misc   metFORMIN 500 MG tablet Commonly known as: GLUCOPHAGE   multivitamin-prenatal 27-0.8 MG Tabs tablet     TAKE these medications   acetaminophen 325 MG tablet Commonly known as: Tylenol Take 2 tablets (650 mg total) by mouth every 6 (six) hours as needed (for pain scale < 4).   aspirin EC 325 MG tablet Take 1 tablet (325 mg total) by mouth daily. Replaces:  aspirin 81 MG chewable tablet   atorvastatin 10 MG tablet Commonly known as: Lipitor Take 1 tablet (10 mg total) by mouth daily.   Blood Pressure Kit Devi 1 kit by Does not apply route as needed.   enoxaparin 150 MG/ML injection Commonly known as: LOVENOX Inject 1 mL (150 mg total) into the skin daily. Replaces: enoxaparin 40 MG/0.4ML injection   ibuprofen 600 MG tablet Commonly known as: ADVIL Take 1 tablet (600 mg total) by mouth every 6 (six) hours.   omeprazole 40 MG capsule Commonly known as: PRILOSEC Take 1 capsule (40 mg total) by mouth daily.   Vitafol Ultra 29-0.6-0.4-200 MG Caps Take 1 capsule by  mouth daily before breakfast.        Discharge home in stable condition Infant Feeding: Breast Infant Disposition:home with mother Discharge instruction: per After Visit Summary and Postpartum booklet. Activity: Advance as tolerated. Pelvic rest for 6 weeks.  Diet: routine diet Future Appointments: Future Appointments  Date Time Provider [place]  02/06/2020  8:15 AM [facility] None  02/06/2020  8:45 AM Constant, Peggy, MD [facility] None   Follow up Visit:   Please schedule this patient for a In person postpartum visit in 6 weeks with the following provider: MD. Additional Postpartum F/U:2 hour GTT and IUD string check  High risk pregnancy complicated by: GDM and history of Protein S deficiency and prior stroke- on Lovenox 150 mg daily for 6 weeks Delivery mode:  Vaginal, Spontaneous  Anticipated Birth Control:  PP IUD placed   12/26/2019 Merilyn Baba, DO PGY 1 Family Medicine

## 2019-12-24 NOTE — Progress Notes (Addendum)
Labor Progress Note Angel [name] is a 29 y.o. G3P2002 at [redacted]w[redacted]d presented for IUP at 107w1d presenting for IOL 2/2 A2GDM. Labor progressing well.  S: Pt reports mild contractions s/p 1 dose of vaginal cytotec. No headache, vision changes or other concerns at this time.  O:  BP (!) 132/61   Pulse 70   Temp 97.9 F (36.6 C) (Oral)   Resp 16   Ht 5\' 9"  (1.753 m)   Wt (!) 135.4 kg   LMP 04/01/2019   BMI 44.10 kg/m  EFM: BL 140, moderate variability, +accels, no decels  CVE: Dilation: 5 Effacement (%): 40 Station: -2 Exam by:: Dr. [email or web address]   A&P: 29 y.o. 26 [redacted]w[redacted]d presented for IUP at [redacted]w[redacted]d presenting for IOL 2/2 A2GDM. #Labor: Progressing well. Will plan to start pitocin given cervical change. #Pain: plan for epidural #FWB: Category 1 strip: BL 140, moderate variability, +accels, no decels #GBS negative #A2GDM on insulin: on admission reports fasting sugars have been >100, postprandials >130. CBGs q4h, q2h when active. Consider SSI for abnormal CBGS, if consistent consider endoTool #H/o ischemic stroke x2 in 2015 most likely 2/2 Protein S deficiency: On lovenox, last dose 7/29. Continue to monitor. #H/o pre-eclampsia: BP normotensive on admission, consider Pre-E labs if BP elevated.  8/29, MD 3:24 PM

## 2019-12-25 LAB — RPR: RPR Ser Ql: NONREACTIVE

## 2019-12-25 MED ORDER — PANTOPRAZOLE SODIUM 40 MG PO TBEC
40.0000 mg | DELAYED_RELEASE_TABLET | Freq: Every day | ORAL | Status: DC
  Administered 2019-12-25: 40 mg via ORAL
  Filled 2019-12-25: qty 1

## 2019-12-25 MED ORDER — FERROUS SULFATE 325 (65 FE) MG PO TABS
325.0000 mg | ORAL_TABLET | ORAL | Status: DC
  Administered 2019-12-25: 325 mg via ORAL
  Filled 2019-12-25: qty 1

## 2019-12-25 MED ORDER — DIPHENHYDRAMINE HCL 25 MG PO CAPS: 25.0000 mg | ORAL_CAPSULE | Freq: Four times a day (QID) | ORAL | Status: DC | PRN

## 2019-12-25 MED ORDER — COCONUT OIL OIL: 1.0000 "application " | TOPICAL_OIL | Status: DC | PRN

## 2019-12-25 MED ORDER — MAGNESIUM HYDROXIDE 400 MG/5ML PO SUSP: 30.0000 mL | ORAL | Status: DC | PRN

## 2019-12-25 MED ORDER — METHYLERGONOVINE MALEATE 0.2 MG PO TABS: 0.2000 mg | ORAL_TABLET | ORAL | Status: DC | PRN

## 2019-12-25 MED ORDER — ONDANSETRON HCL 4 MG PO TABS: 4.0000 mg | ORAL_TABLET | ORAL | Status: DC | PRN

## 2019-12-25 MED ORDER — IBUPROFEN 600 MG PO TABS
600.0000 mg | ORAL_TABLET | Freq: Four times a day (QID) | ORAL | Status: DC
  Administered 2019-12-25 – 2019-12-26 (×7): 600 mg via ORAL
  Filled 2019-12-25 (×8): qty 1

## 2019-12-25 MED ORDER — PRENATAL MULTIVITAMIN CH
1.0000 | ORAL_TABLET | Freq: Every day | ORAL | Status: DC
  Administered 2019-12-25 – 2019-12-26 (×2): 1 via ORAL
  Filled 2019-12-25 (×2): qty 1

## 2019-12-25 MED ORDER — ENOXAPARIN SODIUM 80 MG/0.8ML ~~LOC~~ SOLN
0.5000 mg/kg | SUBCUTANEOUS | Status: DC
  Administered 2019-12-25: 67.5 mg via SUBCUTANEOUS
  Filled 2019-12-25: qty 0.8

## 2019-12-25 MED ORDER — ACETAMINOPHEN 325 MG PO TABS
650.0000 mg | ORAL_TABLET | ORAL | Status: DC | PRN
  Administered 2019-12-25: 650 mg via ORAL
  Filled 2019-12-25: qty 2

## 2019-12-25 MED ORDER — SENNOSIDES-DOCUSATE SODIUM 8.6-50 MG PO TABS
2.0000 | ORAL_TABLET | ORAL | Status: DC
  Administered 2019-12-25 (×2): 2 via ORAL
  Filled 2019-12-25 (×2): qty 2

## 2019-12-25 MED ORDER — METHYLERGONOVINE MALEATE 0.2 MG/ML IJ SOLN: 0.2000 mg | INTRAMUSCULAR | Status: DC | PRN

## 2019-12-25 MED ORDER — WITCH HAZEL-GLYCERIN EX PADS: 1.0000 "application " | MEDICATED_PAD | CUTANEOUS | Status: DC | PRN

## 2019-12-25 MED ORDER — OXYCODONE HCL 5 MG PO TABS: 5.0000 mg | ORAL_TABLET | ORAL | Status: DC | PRN

## 2019-12-25 MED ORDER — OXYCODONE HCL 5 MG PO TABS: 10.0000 mg | ORAL_TABLET | ORAL | Status: DC | PRN

## 2019-12-25 MED ORDER — BENZOCAINE-MENTHOL 20-0.5 % EX AERO
1.0000 "application " | INHALATION_SPRAY | CUTANEOUS | Status: DC | PRN
  Administered 2019-12-25: 1 via TOPICAL
  Filled 2019-12-25: qty 56

## 2019-12-25 MED ORDER — DIBUCAINE (PERIANAL) 1 % EX OINT: 1.0000 "application " | TOPICAL_OINTMENT | CUTANEOUS | Status: DC | PRN

## 2019-12-25 MED ORDER — SIMETHICONE 80 MG PO CHEW: 80.0000 mg | CHEWABLE_TABLET | ORAL | Status: DC | PRN

## 2019-12-25 MED ORDER — ONDANSETRON HCL 4 MG/2ML IJ SOLN: 4.0000 mg | INTRAMUSCULAR | Status: DC | PRN

## 2019-12-25 NOTE — Anesthesia Postprocedure Evaluation (Signed)
Anesthesia Post Note  Patient: [name]  Procedure(s) Performed: AN AD HOC LABOR EPIDURAL     Patient location during evaluation: PACU Anesthesia Type: Epidural Level of consciousness: awake and alert Pain management: pain level controlled Vital Signs Assessment: post-procedure vital signs reviewed and stable Respiratory status: spontaneous breathing, nonlabored ventilation and respiratory function stable Cardiovascular status: stable Postop Assessment: no headache, no backache, epidural receding, no apparent nausea or vomiting, patient able to bend at knees, adequate PO intake and able to ambulate Anesthetic complications: no   No complications documented.  Last Vitals:  Vitals:   12/25/19 0048 12/25/19 0445  BP: (!) 112/64 (!) 116/51  Pulse: 95 68  Resp: 18 18  Temp: 36.7 C 36.5 C  SpO2: 100% 100%    Last Pain:  Vitals:   12/25/19 0727  TempSrc:   PainSc: 0-No pain   Pain Goal:                   Laban Emperor

## 2019-12-25 NOTE — Progress Notes (Signed)
POSTPARTUM PROGRESS NOTE  Post Partum Day 1  Subjective:  Angel [name] is a 29 y.o. [id number] s/p NSVD at [redacted]w[redacted]d.  She reports she is doing well. No acute events overnight. She denies any problems with ambulating, voiding or po intake. Denies nausea or vomiting.  Pain is well controlled.  Lochia is like a period.  Objective: Blood pressure (!) 116/51, pulse 68, temperature 97.7 F (36.5 C), temperature source Oral, resp. rate 18, height 5\' 9"  (1.753 m), weight (!) 135.4 kg, last menstrual period 04/01/2019, SpO2 100 %, unknown if currently breastfeeding.  Physical Exam:  General: alert, cooperative and no distress Chest: no respiratory distress Heart:regular rate, distal pulses intact Abdomen: soft, nontender,  Uterine Fundus: firm, appropriately tender DVT Evaluation: No calf swelling or tenderness Extremities: no LE edema, SCDs in place and machine on Skin: warm, dry  Recent Labs    12/24/19 1028  HGB 10.0*  HCT 32.1*    Assessment/Plan: Angel [name] is a 29 y.o. 26 s/p NSVD at [redacted]w[redacted]d   PPD#1 - Doing well  Routine postpartum care Protein S deficiency/hx of CVA: SCDs in place and on, plan to resume prophylactive lovenox for 6wks this evening at 24h PP per ACOG guidelines Contraception: post placental IUD placed Feeding: breast Acute blood loss anemia: start PO iron every other day Dispo: Plan for discharge PPD#2 (late delivery).   LOS: 1 day   [redacted]w[redacted]d, MD/MP[facility] OB Fellow  12/25/2019, 7:41 AM

## 2019-12-26 LAB — GLUCOSE, CAPILLARY: Glucose-Capillary: 88 mg/dL (ref 70–99)

## 2019-12-26 MED ORDER — ASPIRIN 81 MG PO CHEW
81.0000 mg | CHEWABLE_TABLET | Freq: Every day | ORAL | Status: DC
  Administered 2019-12-26: 81 mg via ORAL
  Filled 2019-12-26: qty 1

## 2019-12-26 MED ORDER — ASPIRIN EC 325 MG PO TBEC
325.0000 mg | DELAYED_RELEASE_TABLET | Freq: Every day | ORAL | 3 refills | Status: AC
Start: 2019-12-26 — End: 2020-12-25

## 2019-12-26 MED ORDER — ENOXAPARIN SODIUM 150 MG/ML ~~LOC~~ SOLN
150.0000 mg | SUBCUTANEOUS | Status: DC
  Administered 2019-12-26: 150 mg via SUBCUTANEOUS
  Filled 2019-12-26: qty 1

## 2019-12-26 MED ORDER — IBUPROFEN 600 MG PO TABS: 600.0000 mg | ORAL_TABLET | Freq: Four times a day (QID) | ORAL | 0 refills | Status: DC

## 2019-12-26 MED ORDER — ACETAMINOPHEN 325 MG PO TABS: 650.0000 mg | ORAL_TABLET | Freq: Four times a day (QID) | ORAL | 0 refills | Status: DC | PRN

## 2019-12-26 MED ORDER — ATORVASTATIN CALCIUM 10 MG PO TABS
10.0000 mg | ORAL_TABLET | Freq: Every day | ORAL | 11 refills | Status: DC
Start: 2019-12-26 — End: 2021-08-27

## 2019-12-26 MED ORDER — ENOXAPARIN SODIUM 150 MG/ML ~~LOC~~ SOLN: 150.0000 mg | SUBCUTANEOUS | 0 refills | Status: DC

## 2019-12-26 NOTE — Discharge Instructions (Signed)

## 2019-12-30 ENCOUNTER — Other Ambulatory Visit: Payer: Self-pay

## 2019-12-30 MED ORDER — BREAST PUMP MISC: 0 refills | Status: DC

## 2019-12-30 NOTE — Progress Notes (Signed)
TC from pt requesting Breast pump  Rx written and printed.

## 2020-01-18 DIAGNOSIS — F419 Anxiety disorder, unspecified: Secondary | ICD-10-CM | POA: Diagnosis not present

## 2020-01-18 DIAGNOSIS — E663 Overweight: Secondary | ICD-10-CM | POA: Diagnosis not present

## 2020-01-18 DIAGNOSIS — Z8673 Personal history of transient ischemic attack (TIA), and cerebral infarction without residual deficits: Secondary | ICD-10-CM | POA: Diagnosis not present

## 2020-02-06 ENCOUNTER — Ambulatory Visit: Payer: Medicaid Other | Admitting: Obstetrics and Gynecology

## 2020-02-06 ENCOUNTER — Other Ambulatory Visit: Payer: Medicaid Other

## 2020-04-08 ENCOUNTER — Ambulatory Visit
Admission: RE | Admit: 2020-04-08 | Discharge: 2020-04-08 | Disposition: A | Discharge status: Home / Self Care | Payer: Medicaid Other | Source: Ambulatory Visit | Attending: Emergency Medicine | Admitting: Emergency Medicine

## 2020-04-08 ENCOUNTER — Other Ambulatory Visit: Payer: Self-pay

## 2020-04-08 VITALS — BP 159/84 | HR 88 | Temp 97.9°F | Resp 18

## 2020-04-08 DIAGNOSIS — L03011 Cellulitis of right finger: Secondary | ICD-10-CM

## 2020-04-08 MED ORDER — AMOXICILLIN-POT CLAVULANATE 875-125 MG PO TABS
1.0000 | ORAL_TABLET | Freq: Two times a day (BID) | ORAL | 0 refills | Status: AC
Start: 2020-04-08 — End: 2020-04-13

## 2020-04-08 NOTE — ED Provider Notes (Signed)
[facility]    CSN: [id number] Arrival date & time: 04/08/20  0846      History   Chief Complaint Chief Complaint  Patient presents with  . Appointment    900  . Hand Pain    HPI Angel [name] is a 29 y.o. female  Presenting for right middle finger pain and swelling.  States that she had a hangnail that she bit off several days ago.  States the other day she tried using a needle to poke it, "but only blood came out ".  Denies trauma, numbness.  Past Medical History:  Diagnosis Date  . Acute ischemic stroke ([place]) 05/13/2014  . Anemia   . Anxiety   . Gestational diabetes   . Headache   . Hyperlipidemia   . Migraine 03/13/2014  . Morbid obesity ([place]) 03/13/2014  . Obesity   . Protein S deficiency [facility])     Patient Active Problem List   Diagnosis Date Noted  . IUD (intrauterine device) in place 12/24/2019  . Insulin controlled gestational diabetes mellitus (GDM) in third trimester 09/27/2019  . Protein S deficiency affecting pregnancy, antepartum ([place]) 09/12/2019  . History of pre-eclampsia in prior pregnancy, currently pregnant 09/12/2019  . Maternal obesity affecting pregnancy, antepartum 09/12/2019  . Supervision of other high risk pregnancy, antepartum 07/12/2019  . Protein S deficiency ([place])   . History of ischemic stroke x2 in 2015 05/13/2014  . Anemia   . Migraine 03/13/2014  . Morbid obesity ([place]) 03/13/2014    Past Surgical History:  Procedure Laterality Date  . none    . TEE WITHOUT CARDIOVERSION N/A 05/17/2014   Procedure: TRANSESOPHAGEAL ECHOCARDIOGRAM (TEE);  Surgeon: Josue Hector, MD;  Location: [facility] ENDOSCOPY;  Service: Cardiovascular;  Laterality: N/A;    OB History    Gravida  3   Para  3   Term  3   Preterm      AB      Living  3     SAB      TAB      Ectopic      Multiple  0   Live Births  3            Home Medications    Prior to Admission medications   Medication Sig Start Date End Date  Taking? Authorizing Provider  acetaminophen (TYLENOL) 325 MG tablet Take 2 tablets (650 mg total) by mouth every 6 (six) hours as needed (for pain scale < 4). 12/26/19   Lilland, Alana, DO  amoxicillin-clavulanate (AUGMENTIN) 875-125 MG tablet Take 1 tablet by mouth every 12 (twelve) hours for 5 days. 04/08/20 04/13/20  Hall-Potvin, [place], PA-C  aspirin EC 325 MG tablet Take 1 tablet (325 mg total) by mouth daily. 12/26/19 12/25/20  Sparacino, Hailey L, DO  atorvastatin (LIPITOR) 10 MG tablet Take 1 tablet (10 mg total) by mouth daily. 12/26/19 12/25/20  Sparacino, Hailey L, DO  Blood Pressure Monitoring (BLOOD PRESSURE KIT) DEVI 1 kit by Does not apply route as needed. 07/12/19   Constant, Peggy, MD  enoxaparin (LOVENOX) 150 MG/ML injection Inject 1 mL (150 mg total) into the skin daily. 12/26/19 02/06/20  Sparacino, Hailey L, DO  ibuprofen (ADVIL) 600 MG tablet Take 1 tablet (600 mg total) by mouth every 6 (six) hours. 12/26/19   Lilland, Lorrin Goodell, DO  Misc. Devices (BREAST PUMP) MISC Dispense one breast pump for patient 12/30/19   Shelly Bombard, MD  omeprazole (PRILOSEC) 40 MG capsule Take 1 capsule (  40 mg total) by mouth daily. 10/27/14   Hassell Done Mary-Margaret, FNP  Prenat-Fe Poly-Methfol-FA-DHA (VITAFOL ULTRA) 29-0.6-0.4-200 MG CAPS Take 1 capsule by mouth daily before breakfast. 08/12/19   Shelly Bombard, MD    Family History Family History  Problem Relation Age of Onset  . Cancer Paternal Uncle   . Diabetes Paternal Uncle   . Heart disease Maternal Grandmother   . Heart disease Maternal Grandfather   . Diabetes Paternal Grandmother     Social History Social History   Tobacco Use  . Smoking status: Former Research scientist (life sciences)  . Smokeless tobacco: Never Used  Vaping Use  . Vaping Use: Never used  Substance Use Topics  . Alcohol use: Not Currently    Alcohol/week: 0.0 standard drinks    Comment: Occasionally.  . Drug use: No     Allergies   Patient has no known allergies.   Review of  Systems Review of Systems  Constitutional: Negative for fatigue and fever.  HENT: Negative for ear pain, sinus pain, sore throat and voice change.   Eyes: Negative for pain, redness and visual disturbance.  Respiratory: Negative for cough and shortness of breath.   Cardiovascular: Negative for chest pain and palpitations.  Gastrointestinal: Negative for abdominal pain, diarrhea and vomiting.  Musculoskeletal: Negative for arthralgias and myalgias.  Skin: Positive for wound. Negative for rash.  Neurological: Negative for syncope and headaches.     Physical Exam Triage Vital Signs ED Triage Vitals  Enc Vitals Group     BP      Pulse      Resp      Temp      Temp src      SpO2      Weight      Height      Head Circumference      Peak Flow      Pain Score      Pain Loc      Pain Edu?      Excl. in [place]?    No data found.  Updated Vital Signs BP (!) 159/84 (BP Location: Left Arm)   Pulse 88   Temp 97.9 F (36.6 C) (Oral)   Resp 18   SpO2 97%   Visual Acuity Right Eye Distance:   Left Eye Distance:   Bilateral Distance:    Right Eye Near:   Left Eye Near:    Bilateral Near:     Physical Exam Constitutional:      General: She is not in acute distress. HENT:     Head: Normocephalic and atraumatic.  Eyes:     General: No scleral icterus.    Pupils: Pupils are equal, round, and reactive to light.  Cardiovascular:     Rate and Rhythm: Normal rate.  Pulmonary:     Effort: Pulmonary effort is normal.  Musculoskeletal:        General: Swelling and tenderness present. Normal range of motion.     Comments: Right third middle finger with medial forming paronychia.  No discharge or open wound.  NVI  Skin:    Coloration: Skin is not jaundiced or pale.  Neurological:     Mental Status: She is alert and oriented to person, place, and time.      UC Treatments / Results  Labs (all labs ordered are listed, but only abnormal results are displayed) Labs Reviewed - No  data to display  EKG   Radiology No results found.  Procedures Procedures (including critical care  time)  Medications Ordered in UC Medications - No data to display  Initial Impression / Assessment and Plan / UC Course  I have reviewed the triage vital signs and the nursing notes.  Pertinent labs & imaging results that were available during my care of the patient were reviewed by me and considered in my medical decision making (see chart for details).     We will start antibiotics today as there is no fluctuance for I&D.  [place] salt soaks, follow-up with PCP.  Return precautions discussed, pt verbalized understanding and is agreeable to plan. Final Clinical Impressions(s) / UC Diagnoses   Final diagnoses:  Paronychia of right middle finger     Discharge Instructions     Keep area(s) clean and dry. Epsom salt soaks, tylenol, ibuprofen. Take antibiotic as prescribed with food - important to complete course. Return for worsening pain, redness, swelling, discharge, fever.  Helpful prevention tips: Keep nails short to avoid secondary skin infections. Use new, clean razors when shaving. Avoid antiperspirants - look for deodorants without aluminum. Avoid wearing underwire bras as this can irritate the area further.     ED Prescriptions    Medication Sig Dispense Auth. Provider   amoxicillin-clavulanate (AUGMENTIN) 875-125 MG tablet Take 1 tablet by mouth every 12 (twelve) hours for 5 days. 10 tablet Hall-Potvin, [place], PA-C     PDMP not reviewed this encounter.   Hall-Potvin, [place], PA-C 04/08/20 0930

## 2020-04-08 NOTE — Discharge Instructions (Addendum)
Keep area(s) clean and dry. Epsom salt soaks, tylenol, ibuprofen. Take antibiotic as prescribed with food - important to complete course. Return for worsening pain, redness, swelling, discharge, fever.  Helpful prevention tips: Keep nails short to avoid secondary skin infections. Use new, clean razors when shaving. Avoid antiperspirants - look for deodorants without aluminum. Avoid wearing underwire bras as this can irritate the area further.

## 2020-04-08 NOTE — ED Triage Notes (Signed)
Pt here for right middle finger pain and swelling around cuticle

## 2020-05-15 DIAGNOSIS — M79605 Pain in left leg: Secondary | ICD-10-CM | POA: Diagnosis not present

## 2020-05-15 DIAGNOSIS — Z8673 Personal history of transient ischemic attack (TIA), and cerebral infarction without residual deficits: Secondary | ICD-10-CM | POA: Diagnosis not present

## 2020-05-15 DIAGNOSIS — E663 Overweight: Secondary | ICD-10-CM | POA: Diagnosis not present

## 2020-05-15 DIAGNOSIS — F419 Anxiety disorder, unspecified: Secondary | ICD-10-CM | POA: Diagnosis not present

## 2020-05-15 DIAGNOSIS — K219 Gastro-esophageal reflux disease without esophagitis: Secondary | ICD-10-CM | POA: Diagnosis not present

## 2020-06-15 DIAGNOSIS — F419 Anxiety disorder, unspecified: Secondary | ICD-10-CM | POA: Diagnosis not present

## 2020-06-15 DIAGNOSIS — K219 Gastro-esophageal reflux disease without esophagitis: Secondary | ICD-10-CM | POA: Diagnosis not present

## 2020-06-15 DIAGNOSIS — E663 Overweight: Secondary | ICD-10-CM | POA: Diagnosis not present

## 2020-06-15 DIAGNOSIS — R890 Abnormal level of enzymes in specimens from other organs, systems and tissues: Secondary | ICD-10-CM | POA: Diagnosis not present

## 2020-10-04 DIAGNOSIS — F419 Anxiety disorder, unspecified: Secondary | ICD-10-CM | POA: Diagnosis not present

## 2020-10-04 DIAGNOSIS — M25561 Pain in right knee: Secondary | ICD-10-CM | POA: Diagnosis not present

## 2020-10-04 DIAGNOSIS — E782 Mixed hyperlipidemia: Secondary | ICD-10-CM | POA: Diagnosis not present

## 2020-10-04 DIAGNOSIS — R635 Abnormal weight gain: Secondary | ICD-10-CM | POA: Diagnosis not present

## 2020-10-04 DIAGNOSIS — Z8673 Personal history of transient ischemic attack (TIA), and cerebral infarction without residual deficits: Secondary | ICD-10-CM | POA: Diagnosis not present

## 2020-12-11 DIAGNOSIS — K219 Gastro-esophageal reflux disease without esophagitis: Secondary | ICD-10-CM | POA: Diagnosis not present

## 2020-12-11 DIAGNOSIS — R519 Headache, unspecified: Secondary | ICD-10-CM | POA: Diagnosis not present

## 2020-12-11 DIAGNOSIS — F419 Anxiety disorder, unspecified: Secondary | ICD-10-CM | POA: Diagnosis not present

## 2020-12-11 DIAGNOSIS — R635 Abnormal weight gain: Secondary | ICD-10-CM | POA: Diagnosis not present

## 2020-12-18 ENCOUNTER — Other Ambulatory Visit: Payer: Self-pay

## 2020-12-18 ENCOUNTER — Ambulatory Visit (HOSPITAL_COMMUNITY)
Admission: EM | Admit: 2020-12-18 | Discharge: 2020-12-18 | Disposition: A | Discharge status: Home / Self Care | Payer: Medicaid Other | Attending: Internal Medicine | Admitting: Internal Medicine

## 2020-12-18 ENCOUNTER — Encounter (HOSPITAL_COMMUNITY): Payer: Self-pay

## 2020-12-18 DIAGNOSIS — R220 Localized swelling, mass and lump, head: Secondary | ICD-10-CM | POA: Diagnosis not present

## 2020-12-18 DIAGNOSIS — K0889 Other specified disorders of teeth and supporting structures: Secondary | ICD-10-CM | POA: Diagnosis not present

## 2020-12-18 DIAGNOSIS — K047 Periapical abscess without sinus: Secondary | ICD-10-CM | POA: Diagnosis not present

## 2020-12-18 MED ORDER — AMOXICILLIN 875 MG PO TABS: 875.0000 mg | ORAL_TABLET | Freq: Two times a day (BID) | ORAL | 0 refills | Status: AC

## 2020-12-18 MED ORDER — KETOROLAC TROMETHAMINE 30 MG/ML IJ SOLN
INTRAMUSCULAR | Status: AC
  Filled 2020-12-18: qty 1

## 2020-12-18 MED ORDER — KETOROLAC TROMETHAMINE 30 MG/ML IJ SOLN
30.0000 mg | Freq: Once | INTRAMUSCULAR | Status: AC
  Administered 2020-12-18: 30 mg via INTRAMUSCULAR

## 2020-12-18 NOTE — ED Triage Notes (Signed)
Pt c/o dental pain (bilateral) that has been getting worse for about a week. Interventions: salt rinse, tylenol, motrin, none provide relief

## 2020-12-18 NOTE — Discharge Instructions (Addendum)
Please seek further evaluation with a dentist soon as possible.  You have been prescribed Amoxicillin antibiotic to cover for any infection that is being caused by your teeth.  You have been given ketorolac injection in urgent care to decrease pain and inflammation.  Please do not take any over-the-counter ibuprofen, Advil, Aleve for at least 24 hours following injection.  You may take Tylenol as needed for pain.

## 2020-12-18 NOTE — ED Provider Notes (Signed)
[facility]    CSN: [id number] Arrival date & time: 12/18/20  9449      History   Chief Complaint Chief Complaint  Patient presents with   Dental Pain    HPI Angel [name] is a 30 y.o. female.   Patient presents with 1 week history of bilateral dental and mouth pain that has been worsening over the past week.  Patient has used salt rinses, Tylenol, Motrin that has not provided any relief of pain.  Patient did call dentist but states that they were unable to get her in with an appointment soon.   Dental Pain  Past Medical History:  Diagnosis Date   Acute ischemic stroke ([place]) 05/13/2014   Anemia    Anxiety    Gestational diabetes    Headache    Hyperlipidemia    Migraine 03/13/2014   Morbid obesity ([place]) 03/13/2014   Obesity    Protein S deficiency ([place])     Patient Active Problem List   Diagnosis Date Noted   IUD (intrauterine device) in place 12/24/2019   Insulin controlled gestational diabetes mellitus (GDM) in third trimester 09/27/2019   Protein S deficiency affecting pregnancy, antepartum ([place]) 09/12/2019   History of pre-eclampsia in prior pregnancy, currently pregnant 09/12/2019   Maternal obesity affecting pregnancy, antepartum 09/12/2019   Supervision of other high risk pregnancy, antepartum 07/12/2019   Protein S deficiency ([place])    History of ischemic stroke x2 in 2015 05/13/2014   Anemia    Migraine 03/13/2014   Morbid obesity ([place]) 03/13/2014    Past Surgical History:  Procedure Laterality Date   none     TEE WITHOUT CARDIOVERSION N/A 05/17/2014   Procedure: TRANSESOPHAGEAL ECHOCARDIOGRAM (TEE);  Surgeon: Josue Hector, MD;  Location: [facility] ENDOSCOPY;  Service: Cardiovascular;  Laterality: N/A;    OB History     Gravida  3   Para  3   Term  3   Preterm      AB      Living  3      SAB      IAB      Ectopic      Multiple  0   Live Births  3            Home Medications    Prior to Admission  medications   Medication Sig Start Date End Date Taking? Authorizing Provider  amoxicillin (AMOXIL) 875 MG tablet Take 1 tablet (875 mg total) by mouth 2 (two) times daily for 10 days. 12/18/20 12/28/20 Yes Odis Luster, FNP  acetaminophen (TYLENOL) 325 MG tablet Take 2 tablets (650 mg total) by mouth every 6 (six) hours as needed (for pain scale < 4). 12/26/19   Lilland, Alana, DO  aspirin EC 325 MG tablet Take 1 tablet (325 mg total) by mouth daily. 12/26/19 12/25/20  Sparacino, Hailey L, DO  atorvastatin (LIPITOR) 10 MG tablet Take 1 tablet (10 mg total) by mouth daily. 12/26/19 12/25/20  Sparacino, Hailey L, DO  Blood Pressure Monitoring (BLOOD PRESSURE KIT) DEVI 1 kit by Does not apply route as needed. 07/12/19   Constant, Peggy, MD  enoxaparin (LOVENOX) 150 MG/ML injection Inject 1 mL (150 mg total) into the skin daily. 12/26/19 02/06/20  Sparacino, Hailey L, DO  ibuprofen (ADVIL) 600 MG tablet Take 1 tablet (600 mg total) by mouth every 6 (six) hours. 12/26/19   Lilland, Lorrin Goodell, DO  Misc. Devices (BREAST PUMP) MISC Dispense one breast pump for patient 12/30/19  Shelly Bombard, MD  omeprazole (PRILOSEC) 40 MG capsule Take 1 capsule (40 mg total) by mouth daily. 10/27/14   Hassell Done Mary-Margaret, FNP  Prenat-Fe Poly-Methfol-FA-DHA (VITAFOL ULTRA) 29-0.6-0.4-200 MG CAPS Take 1 capsule by mouth daily before breakfast. 08/12/19   Shelly Bombard, MD    Family History Family History  Problem Relation Age of Onset   Cancer Paternal Uncle    Diabetes Paternal Uncle    Heart disease Maternal Grandmother    Heart disease Maternal Grandfather    Diabetes Paternal Grandmother     Social History Social History   Tobacco Use   Smoking status: Former   Smokeless tobacco: Never  Scientific laboratory technician Use: Never used  Substance Use Topics   Alcohol use: Yes    Comment: Occasionally.   Drug use: No     Allergies   Patient has no known allergies.   Review of Systems Review of Systems Per  HPI  Physical Exam Triage Vital Signs ED Triage Vitals  Enc Vitals Group     BP 12/18/20 1045 122/84     Pulse Rate 12/18/20 1045 60     Resp 12/18/20 1045 20     Temp 12/18/20 1045 97.9 F (36.6 C)     Temp Source 12/18/20 1045 Oral     SpO2 12/18/20 1045 99 %     Weight --      Height --      Head Circumference --      Peak Flow --      Pain Score 12/18/20 1043 10     Pain Loc --      Pain Edu? --      Excl. in [place]? --    No data found.  Updated Vital Signs BP 122/84 (BP Location: Right Arm)   Pulse 60   Temp 97.9 F (36.6 C) (Oral)   Resp 20   LMP 11/30/2020   SpO2 99%   Visual Acuity Right Eye Distance:   Left Eye Distance:   Bilateral Distance:    Right Eye Near:   Left Eye Near:    Bilateral Near:     Physical Exam Constitutional:      Appearance: Normal appearance.  HENT:     Head: Normocephalic and atraumatic.     Mouth/Throat:     Mouth: Mucous membranes are moist.     Dentition: Abnormal dentition. Dental tenderness and gingival swelling present.     Comments: Patient has poor dental health with mild gingival swelling surrounding multiple teeth. Eyes:     Extraocular Movements: Extraocular movements intact.     Conjunctiva/sclera: Conjunctivae normal.  Pulmonary:     Effort: Pulmonary effort is normal.  Neurological:     General: No focal deficit present.     Mental Status: She is alert and oriented to person, place, and time. Mental status is at baseline.  Psychiatric:        Mood and Affect: Mood normal.        Behavior: Behavior normal.        Thought Content: Thought content normal.        Judgment: Judgment normal.     UC Treatments / Results  Labs (all labs ordered are listed, but only abnormal results are displayed) Labs Reviewed - No data to display  EKG   Radiology No results found.  Procedures Procedures (including critical care time)  Medications Ordered in UC Medications  ketorolac (TORADOL) 30 MG/ML injection 30  mg (  30 mg Intramuscular Given 12/18/20 1141)    Initial Impression / Assessment and Plan / UC Course  I have reviewed the triage vital signs and the nursing notes.  Pertinent labs & imaging results that were available during my care of the patient were reviewed by me and considered in my medical decision making (see chart for details).     Will treat gingival swelling and possible dental abscess with amoxicillin x10 days.  Patient was given ketorolac injection in urgent care today for pain and advised not take any ibuprofen, Advil, Aleve for at least 24 hours following injection.  Patient to use warm compresses to affected area of face.  Patient was advised of the importance of getting in with a dentist soon as possible for further evaluation and management.Discussed strict return precautions. Patient verbalized understanding and is agreeable with plan.  Final Clinical Impressions(s) / UC Diagnoses   Final diagnoses:  Pain, dental  Dental abscess  Gingival swelling     Discharge Instructions      Please seek further evaluation with a dentist soon as possible.  You have been prescribed Amoxicillin antibiotic to cover for any infection that is being caused by your teeth.  You have been given ketorolac injection in urgent care to decrease pain and inflammation.  Please do not take any over-the-counter ibuprofen, Advil, Aleve for at least 24 hours following injection.  You may take Tylenol as needed for pain.     ED Prescriptions     Medication Sig Dispense Auth. Provider   amoxicillin (AMOXIL) 875 MG tablet Take 1 tablet (875 mg total) by mouth 2 (two) times daily for 10 days. 20 tablet Odis Luster, FNP      PDMP not reviewed this encounter.   Odis Luster, FNP 12/18/20 1215

## 2021-01-22 ENCOUNTER — Other Ambulatory Visit: Payer: Self-pay

## 2021-01-22 ENCOUNTER — Encounter (HOSPITAL_BASED_OUTPATIENT_CLINIC_OR_DEPARTMENT_OTHER): Payer: Self-pay

## 2021-01-22 ENCOUNTER — Emergency Department (HOSPITAL_BASED_OUTPATIENT_CLINIC_OR_DEPARTMENT_OTHER): Payer: Medicaid Other

## 2021-01-22 ENCOUNTER — Emergency Department (HOSPITAL_COMMUNITY): Payer: Medicaid Other

## 2021-01-22 ENCOUNTER — Emergency Department (HOSPITAL_BASED_OUTPATIENT_CLINIC_OR_DEPARTMENT_OTHER)
Admission: EM | Admit: 2021-01-22 | Discharge: 2021-01-22 | Disposition: A | Discharge status: Home / Self Care | Payer: Medicaid Other | Attending: Emergency Medicine | Admitting: Emergency Medicine

## 2021-01-22 DIAGNOSIS — R519 Headache, unspecified: Secondary | ICD-10-CM | POA: Insufficient documentation

## 2021-01-22 DIAGNOSIS — M79661 Pain in right lower leg: Secondary | ICD-10-CM | POA: Diagnosis not present

## 2021-01-22 DIAGNOSIS — Z8673 Personal history of transient ischemic attack (TIA), and cerebral infarction without residual deficits: Secondary | ICD-10-CM | POA: Diagnosis not present

## 2021-01-22 DIAGNOSIS — M542 Cervicalgia: Secondary | ICD-10-CM

## 2021-01-22 DIAGNOSIS — Z87891 Personal history of nicotine dependence: Secondary | ICD-10-CM | POA: Diagnosis not present

## 2021-01-22 DIAGNOSIS — G9389 Other specified disorders of brain: Secondary | ICD-10-CM | POA: Diagnosis not present

## 2021-01-22 DIAGNOSIS — R600 Localized edema: Secondary | ICD-10-CM | POA: Insufficient documentation

## 2021-01-22 DIAGNOSIS — M79662 Pain in left lower leg: Secondary | ICD-10-CM | POA: Diagnosis not present

## 2021-01-22 LAB — CBC WITH DIFFERENTIAL/PLATELET
Abs Immature Granulocytes: 0.02 10*3/uL (ref 0.00–0.07)
Basophils Absolute: 0 10*3/uL (ref 0.0–0.1)
Basophils Relative: 0 %
Eosinophils Absolute: 0.1 10*3/uL (ref 0.0–0.5)
Eosinophils Relative: 1 %
HCT: 36.1 % (ref 36.0–46.0)
Hemoglobin: 11.4 g/dL — ABNORMAL LOW (ref 12.0–15.0)
Immature Granulocytes: 0 %
Lymphocytes Relative: 41 %
Lymphs Abs: 3.1 10*3/uL (ref 0.7–4.0)
MCH: 26.7 pg (ref 26.0–34.0)
MCHC: 31.6 g/dL (ref 30.0–36.0)
MCV: 84.5 fL (ref 80.0–100.0)
Monocytes Absolute: 0.4 10*3/uL (ref 0.1–1.0)
Monocytes Relative: 5 %
Neutro Abs: 4.1 10*3/uL (ref 1.7–7.7)
Neutrophils Relative %: 53 %
Platelets: 377 10*3/uL (ref 150–400)
RBC: 4.27 MIL/uL (ref 3.87–5.11)
RDW: 17.7 % — ABNORMAL HIGH (ref 11.5–15.5)
WBC: 7.7 10*3/uL (ref 4.0–10.5)
nRBC: 0 % (ref 0.0–0.2)

## 2021-01-22 LAB — BASIC METABOLIC PANEL
Anion gap: 7 (ref 5–15)
BUN: 9 mg/dL (ref 6–20)
CO2: 27 mmol/L (ref 22–32)
Calcium: 9.5 mg/dL (ref 8.9–10.3)
Chloride: 104 mmol/L (ref 98–111)
Creatinine, Ser: 0.7 mg/dL (ref 0.44–1.00)
GFR, Estimated: >60 mL/min (ref >60)
Glucose, Bld: 91 mg/dL (ref 70–99)
Potassium: 4 mmol/L (ref 3.5–5.1)
Sodium: 138 mmol/L (ref 135–145)

## 2021-01-22 LAB — PREGNANCY, URINE: Preg Test, Ur: NEGATIVE

## 2021-01-22 MED ORDER — IOHEXOL 350 MG/ML SOLN
75.0000 mL | Freq: Once | INTRAVENOUS | Status: AC | PRN
  Administered 2021-01-22: 75 mL via INTRAVENOUS

## 2021-01-22 MED ORDER — GADOBUTROL 1 MMOL/ML IV SOLN
10.0000 mL | Freq: Once | INTRAVENOUS | Status: AC | PRN
  Administered 2021-01-22: 10 mL via INTRAVENOUS

## 2021-01-22 NOTE — ED Notes (Signed)
Pt transported to CT ?

## 2021-01-22 NOTE — ED Triage Notes (Signed)
Pt arrives POV, c/o headaches for approximately 2 weeks.  Has had some nausea and light sensitivity, denies vomiting or changes in vision.  Reports bilateral posterior cervical pain.  States she has a history of CVA in 2016.  Pt A&O and in NAD.

## 2021-01-22 NOTE — ED Provider Notes (Signed)
[facility] DEPT Provider Note   CSN: [id number] Arrival date & time: 01/22/21  1121     History Chief Complaint  Patient presents with   Headache   Neck Pain    Angel [name] is a 30 y.o. female.  HPI     30 year old female comes in with chief complaint of headache and neck pain.  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  She currently is not taking aspirin and has not followed up with neurology or oncology recently.  Patient reports that over the last 2 months, she has started having headaches.  Headaches are not different location and different intensity.  More recently, the headaches have become pretty constant.  They are responding to Tylenol still, but the intensity has gone up.  Patient also has had some neck pain.  She denies any focal numbness, weakness, slurred speech, visual deficits, balance issues, dizziness.  Headaches preceded her prior stroke, so she finally decided to come to the ER.  Past Medical History:  Diagnosis Date   Acute ischemic stroke ([place]) 05/13/2014   Anemia    Anxiety    Gestational diabetes    Headache    Hyperlipidemia    Migraine 03/13/2014   Morbid obesity ([place]) 03/13/2014   Obesity    Protein S deficiency ([place])     Patient Active Problem List   Diagnosis Date Noted   IUD (intrauterine device) in place 12/24/2019   Insulin controlled gestational diabetes mellitus (GDM) in third trimester 09/27/2019   Protein S deficiency affecting pregnancy, antepartum ([place]) 09/12/2019   History of pre-eclampsia in prior pregnancy, currently pregnant 09/12/2019   Maternal obesity affecting pregnancy, antepartum 09/12/2019   Supervision of other high risk pregnancy, antepartum 07/12/2019   Protein S deficiency ([place])    History of ischemic stroke x2 in 2015 05/13/2014   Anemia    Migraine 03/13/2014   Morbid obesity ([place]) 03/13/2014    Past Surgical History:  Procedure Laterality Date    none     TEE WITHOUT CARDIOVERSION N/A 05/17/2014   Procedure: TRANSESOPHAGEAL ECHOCARDIOGRAM (TEE);  Surgeon: Josue Hector, MD;  Location: [facility] ENDOSCOPY;  Service: Cardiovascular;  Laterality: N/A;     OB History     Gravida  3   Para  3   Term  3   Preterm      AB      Living  3      SAB      IAB      Ectopic      Multiple  0   Live Births  3           Family History  Problem Relation Age of Onset   Cancer Paternal Uncle    Diabetes Paternal Uncle    Heart disease Maternal Grandmother    Heart disease Maternal Grandfather    Diabetes Paternal Grandmother     Social History   Tobacco Use   Smoking status: Former   Smokeless tobacco: Never  Scientific laboratory technician Use: Never used  Substance Use Topics   Alcohol use: Yes    Comment: Occasionally.   Drug use: No    Home Medications Prior to Admission medications   Medication Sig Start Date End Date Taking? Authorizing Provider  alprazolam Duanne Moron) 2 MG tablet Take 2 mg by mouth 3 (three) times daily as needed. 01/09/21  Yes [provider]  atorvastatin (LIPITOR) 10 MG tablet Take 1 tablet (  10 mg total) by mouth daily. 12/26/19 01/22/21 Yes Sparacino, Hailey L, DO  omeprazole (PRILOSEC) 40 MG capsule Take 1 capsule (40 mg total) by mouth daily. 10/27/14  Yes Hassell Done, Mary-Margaret, FNP  acetaminophen (TYLENOL) 325 MG tablet Take 2 tablets (650 mg total) by mouth every 6 (six) hours as needed (for pain scale < 4). 12/26/19   Lilland, Alana, DO  Blood Pressure Monitoring (BLOOD PRESSURE KIT) DEVI 1 kit by Does not apply route as needed. 07/12/19   Constant, Peggy, MD  ibuprofen (ADVIL) 600 MG tablet Take 1 tablet (600 mg total) by mouth every 6 (six) hours. Patient not taking: No sig reported 12/26/19   Lilland, Alana, DO  Misc. Devices (BREAST PUMP) MISC Dispense one breast pump for patient Patient not taking: No sig reported 12/30/19   Shelly Bombard, MD  Prenat-Fe Poly-Methfol-FA-DHA (VITAFOL ULTRA)  29-0.6-0.4-200 MG CAPS Take 1 capsule by mouth daily before breakfast. Patient not taking: No sig reported 08/12/19   Shelly Bombard, MD    Allergies    Patient has no known allergies.  Review of Systems   Review of Systems  Constitutional:  Positive for activity change.  Gastrointestinal:  Negative for nausea and vomiting.  Neurological:  Positive for headaches.  Hematological:  Does not bruise/bleed easily.  All other systems reviewed and are negative.  Physical Exam Updated Vital Signs BP (!) 97/54 (BP Location: Left Arm)   Pulse 83   Temp 98.2 F (36.8 C) (Oral)   Resp 16   Ht 5' 9"  (1.753 m)   Wt 129.7 kg   SpO2 100%   BMI 42.23 kg/m   Physical Exam Vitals and nursing note reviewed.  Constitutional:      Appearance: She is well-developed.  HENT:     Head: Atraumatic.  Eyes:     General: No visual field deficit. Cardiovascular:     Rate and Rhythm: Normal rate.  Pulmonary:     Effort: Pulmonary effort is normal.  Musculoskeletal:     Cervical back: Normal range of motion and neck supple. No rigidity.  Skin:    General: Skin is warm and dry.  Neurological:     Mental Status: She is alert and oriented to person, place, and time.     GCS: GCS eye subscore is 4. GCS verbal subscore is 5. GCS motor subscore is 6.     Cranial Nerves: No cranial nerve deficit, dysarthria or facial asymmetry.     Sensory: No sensory deficit.     Motor: No weakness.    ED Results / Procedures / Treatments   Labs (all labs ordered are listed, but only abnormal results are displayed) Labs Reviewed  CBC WITH DIFFERENTIAL/PLATELET - Abnormal; Notable for the following components:      Result Value   Hemoglobin 11.4 (*)    RDW 17.7 (*)    All other components within normal limits  PREGNANCY, URINE  BASIC METABOLIC PANEL    EKG None  Radiology CT Angio Head W/Cm &/Or Wo Cm  Result Date: 01/22/2021 CLINICAL DATA:  Headache, neck pain, history of protein S deficiency EXAM:  CT ANGIOGRAPHY HEAD AND NECK CT VENOGRAPHY HEAD TECHNIQUE: Multidetector CT imaging of the head and neck was performed using the standard protocol during bolus administration of intravenous contrast. Multiplanar CT image reconstructions and MIPs were obtained to evaluate the vascular anatomy. Carotid stenosis measurements (when applicable) are obtained utilizing NASCET criteria, using the distal internal carotid diameter as the denominator. Contiguous axial images  were obtained from the base of the skull through the vertex during the bolus administration of intravenous contrast using CTV timing. Multiplanar CT image reconstructions and MIPs were obtained to evaluate the venous anatomy. CONTRAST:  20m OMNIPAQUE IOHEXOL 350 MG/ML SOLN COMPARISON:  CT head 10/10/2014 FINDINGS: CT HEAD FINDINGS Brain: There is no evidence of acute intracranial hemorrhage, extra-axial fluid collection, or acute infarct. There is a remote infarct in the right occipital lobe. The ventricles are stable in size. There is no mass lesion. There is no midline shift. Vascular: As below Skull: Normal. Negative for fracture or focal lesion. Sinuses: There is mild mucosal thickening along the floor of the maxillary sinuses. Orbits: The imaged globes and orbits are unremarkable. Review of the MIP images confirms the above findings CTA NECK FINDINGS Aortic arch: Standard branching. Imaged portion shows no evidence of aneurysm or dissection. No significant stenosis of the major arch vessel origins. Right carotid system: No evidence of dissection, stenosis (50% or greater) or occlusion. Left carotid system: No evidence of dissection, stenosis (50% or greater) or occlusion. Vertebral arteries: Codominant. No evidence of dissection, stenosis (50% or greater) or occlusion. Skeleton: There is no acute osseous abnormality or aggressive osseous lesion. Other neck: The soft tissues are unremarkable. Upper chest: The lung apices are clear. Review of the MIP  images confirms the above findings CTA HEAD FINDINGS Anterior circulation: The intracranial internal carotid arteries are patent. The bilateral MCAs and ACAs are patent. There is no focal stenosis, occlusion, dissection, or aneurysm. Posterior circulation: The V4 segments of the vertebral arteries are patent. The basilar artery is patent. The distal left PCA is not well seen, similar to the prior CTA and catheter angiogram. The right PCA is patent. Venous sinuses: As permitted by contrast timing, patent. Anatomic variants: There is a fetal origin of the left PCA. Review of the MIP images confirms the above findings CTV HEAD FINDINGS The major dural venous sinuses are patent. The internal cerebral veins and basal veins of Rosenthal are patent. There is no evidence of cortical venous thrombosis. IMPRESSION: 1. No acute intracranial hemorrhage or infarct. 2. Unchanged remote infarct in the right occipital lobe. 3. The distal left PCA is not well seen, similar to the prior CTA and catheter angiogram (from 2016 and 2015 respectively). 4. Otherwise, patent arterial and venous vasculature of the head and neck. Electronically Signed   By: PValetta MoleM.D.   On: 01/22/2021 15:32   CT Angio Neck W and/or Wo Contrast  Result Date: 01/22/2021 CLINICAL DATA:  Headache, neck pain, history of protein S deficiency EXAM: CT ANGIOGRAPHY HEAD AND NECK CT VENOGRAPHY HEAD TECHNIQUE: Multidetector CT imaging of the head and neck was performed using the standard protocol during bolus administration of intravenous contrast. Multiplanar CT image reconstructions and MIPs were obtained to evaluate the vascular anatomy. Carotid stenosis measurements (when applicable) are obtained utilizing NASCET criteria, using the distal internal carotid diameter as the denominator. Contiguous axial images were obtained from the base of the skull through the vertex during the bolus administration of intravenous contrast using CTV timing. Multiplanar CT  image reconstructions and MIPs were obtained to evaluate the venous anatomy. CONTRAST:  768mOMNIPAQUE IOHEXOL 350 MG/ML SOLN COMPARISON:  CT head 10/10/2014 FINDINGS: CT HEAD FINDINGS Brain: There is no evidence of acute intracranial hemorrhage, extra-axial fluid collection, or acute infarct. There is a remote infarct in the right occipital lobe. The ventricles are stable in size. There is no mass lesion. There is no midline  shift. Vascular: As below Skull: Normal. Negative for fracture or focal lesion. Sinuses: There is mild mucosal thickening along the floor of the maxillary sinuses. Orbits: The imaged globes and orbits are unremarkable. Review of the MIP images confirms the above findings CTA NECK FINDINGS Aortic arch: Standard branching. Imaged portion shows no evidence of aneurysm or dissection. No significant stenosis of the major arch vessel origins. Right carotid system: No evidence of dissection, stenosis (50% or greater) or occlusion. Left carotid system: No evidence of dissection, stenosis (50% or greater) or occlusion. Vertebral arteries: Codominant. No evidence of dissection, stenosis (50% or greater) or occlusion. Skeleton: There is no acute osseous abnormality or aggressive osseous lesion. Other neck: The soft tissues are unremarkable. Upper chest: The lung apices are clear. Review of the MIP images confirms the above findings CTA HEAD FINDINGS Anterior circulation: The intracranial internal carotid arteries are patent. The bilateral MCAs and ACAs are patent. There is no focal stenosis, occlusion, dissection, or aneurysm. Posterior circulation: The V4 segments of the vertebral arteries are patent. The basilar artery is patent. The distal left PCA is not well seen, similar to the prior CTA and catheter angiogram. The right PCA is patent. Venous sinuses: As permitted by contrast timing, patent. Anatomic variants: There is a fetal origin of the left PCA. Review of the MIP images confirms the above  findings CTV HEAD FINDINGS The major dural venous sinuses are patent. The internal cerebral veins and basal veins of Rosenthal are patent. There is no evidence of cortical venous thrombosis. IMPRESSION: 1. No acute intracranial hemorrhage or infarct. 2. Unchanged remote infarct in the right occipital lobe. 3. The distal left PCA is not well seen, similar to the prior CTA and catheter angiogram (from 2016 and 2015 respectively). 4. Otherwise, patent arterial and venous vasculature of the head and neck. Electronically Signed   By: Valetta Mole M.D.   On: 01/22/2021 15:32   US Venous Img Lower Bilateral  Result Date: 01/22/2021 CLINICAL DATA:  Bilateral lower extremity pain and edema. Evaluate for DVT. EXAM: BILATERAL LOWER EXTREMITY VENOUS DOPPLER ULTRASOUND TECHNIQUE: Gray-scale sonography with graded compression, as well as color Doppler and duplex ultrasound were performed to evaluate the lower extremity deep venous systems from the level of the common femoral vein and including the common femoral, femoral, profunda femoral, popliteal and calf veins including the posterior tibial, peroneal and gastrocnemius veins when visible. The superficial great saphenous vein was also interrogated. Spectral Doppler was utilized to evaluate flow at rest and with distal augmentation maneuvers in the common femoral, femoral and popliteal veins. COMPARISON:  None. FINDINGS: Examination is degraded due to patient body habitus sonographic. RIGHT LOWER EXTREMITY Common Femoral Vein: No evidence of thrombus. Normal compressibility, respiratory phasicity and response to augmentation. Saphenofemoral Junction: No evidence of thrombus. Normal compressibility and flow on color Doppler imaging. Profunda Femoral Vein: No evidence of thrombus. Normal compressibility and flow on color Doppler imaging. Femoral Vein: No evidence of thrombus. Normal compressibility, respiratory phasicity and response to augmentation. Popliteal Vein: No  evidence of thrombus. Normal compressibility, respiratory phasicity and response to augmentation. Calf Veins: No evidence of thrombus. Normal compressibility and flow on color Doppler imaging. Superficial Great Saphenous Vein: No evidence of thrombus. Normal compressibility. Venous Reflux:  None. Other Findings:  None. LEFT LOWER EXTREMITY Common Femoral Vein: No evidence of thrombus. Normal compressibility, respiratory phasicity and response to augmentation. Saphenofemoral Junction: No evidence of thrombus. Normal compressibility and flow on color Doppler imaging. Profunda Femoral Vein: No evidence  of thrombus. Normal compressibility and flow on color Doppler imaging. Femoral Vein: No evidence of thrombus. Normal compressibility, respiratory phasicity and response to augmentation. Popliteal Vein: No evidence of thrombus. Normal compressibility, respiratory phasicity and response to augmentation. Calf Veins: No evidence of thrombus. Normal compressibility and flow on color Doppler imaging. Superficial Great Saphenous Vein: No evidence of thrombus. Normal compressibility. Venous Reflux:  None. Other Findings:  None. IMPRESSION: No evidence DVT within either lower extremity. Electronically Signed   By: Sandi Mariscal M.D.   On: 01/22/2021 13:36   CT VENOGRAM HEAD  Result Date: 01/22/2021 CLINICAL DATA:  Headache, neck pain, history of protein S deficiency EXAM: CT ANGIOGRAPHY HEAD AND NECK CT VENOGRAPHY HEAD TECHNIQUE: Multidetector CT imaging of the head and neck was performed using the standard protocol during bolus administration of intravenous contrast. Multiplanar CT image reconstructions and MIPs were obtained to evaluate the vascular anatomy. Carotid stenosis measurements (when applicable) are obtained utilizing NASCET criteria, using the distal internal carotid diameter as the denominator. Contiguous axial images were obtained from the base of the skull through the vertex during the bolus administration of  intravenous contrast using CTV timing. Multiplanar CT image reconstructions and MIPs were obtained to evaluate the venous anatomy. CONTRAST:  67m OMNIPAQUE IOHEXOL 350 MG/ML SOLN COMPARISON:  CT head 10/10/2014 FINDINGS: CT HEAD FINDINGS Brain: There is no evidence of acute intracranial hemorrhage, extra-axial fluid collection, or acute infarct. There is a remote infarct in the right occipital lobe. The ventricles are stable in size. There is no mass lesion. There is no midline shift. Vascular: As below Skull: Normal. Negative for fracture or focal lesion. Sinuses: There is mild mucosal thickening along the floor of the maxillary sinuses. Orbits: The imaged globes and orbits are unremarkable. Review of the MIP images confirms the above findings CTA NECK FINDINGS Aortic arch: Standard branching. Imaged portion shows no evidence of aneurysm or dissection. No significant stenosis of the major arch vessel origins. Right carotid system: No evidence of dissection, stenosis (50% or greater) or occlusion. Left carotid system: No evidence of dissection, stenosis (50% or greater) or occlusion. Vertebral arteries: Codominant. No evidence of dissection, stenosis (50% or greater) or occlusion. Skeleton: There is no acute osseous abnormality or aggressive osseous lesion. Other neck: The soft tissues are unremarkable. Upper chest: The lung apices are clear. Review of the MIP images confirms the above findings CTA HEAD FINDINGS Anterior circulation: The intracranial internal carotid arteries are patent. The bilateral MCAs and ACAs are patent. There is no focal stenosis, occlusion, dissection, or aneurysm. Posterior circulation: The V4 segments of the vertebral arteries are patent. The basilar artery is patent. The distal left PCA is not well seen, similar to the prior CTA and catheter angiogram. The right PCA is patent. Venous sinuses: As permitted by contrast timing, patent. Anatomic variants: There is a fetal origin of the left  PCA. Review of the MIP images confirms the above findings CTV HEAD FINDINGS The major dural venous sinuses are patent. The internal cerebral veins and basal veins of Rosenthal are patent. There is no evidence of cortical venous thrombosis. IMPRESSION: 1. No acute intracranial hemorrhage or infarct. 2. Unchanged remote infarct in the right occipital lobe. 3. The distal left PCA is not well seen, similar to the prior CTA and catheter angiogram (from 2016 and 2015 respectively). 4. Otherwise, patent arterial and venous vasculature of the head and neck. Electronically Signed   By: PValetta MoleM.D.   On: 01/22/2021 15:32  Procedures Procedures   Medications Ordered in ED Medications  iohexol (OMNIPAQUE) 350 MG/ML injection 75 mL (75 mLs Intravenous Contrast Given 01/22/21 1312)    ED Course  I have reviewed the triage vital signs and the nursing notes.  Pertinent labs & imaging results that were available during my care of the patient were reviewed by me and considered in my medical decision making (see chart for details).    MDM Rules/Calculators/A&P                            30 year old comes in a chief complaint of headache and neck pain.  No focal neurodeficits.  Headaches are not present at this time.  She has history of protein S deficiency and strokes.  I have reviewed patient's prior MRI, angiograms.  It appears that patient is not following up with neurology or oncology at this time and is not taking aspirin after she delivered her last child few months back.  Clinical concerns would be for arterial thrombosis/occlusion, venous thrombosis. I think she will need advanced imaging while in the ER looking at her arterial and venous circulation  Given the complex past neurologic history, I did discuss the case with Dr. Quinn Axe to get recommendations on work-up.  She recommends that in addition to CT angiogram and CT venogram-we get an MRI brain with and without contrast.  If the MRI is  negative then patient can continue with her outpatient neurology and oncology follow up.  This recommendation has been discussed with the patient.  Dr. Joyice Faster Will be accepting the patient for ED to ED transfer.  Final Clinical Impression(s) / ED Diagnoses Final diagnoses:  Severe headache    Rx / DC Orders ED Discharge Orders     None        Varney Biles, MD 01/22/21 [id number]

## 2021-01-22 NOTE — ED Notes (Signed)
MRI called, report given. Unknown ETA.

## 2021-01-22 NOTE — Discharge Instructions (Addendum)
Start taking aspirin 81 mg daily and follow-up with hematology/oncology and neurology to optimize your management.

## 2021-01-22 NOTE — ED Notes (Signed)
Pt ambulatory to bathroom, no assistance needed.  

## 2021-01-22 NOTE — ED Notes (Signed)
CT made aware that pt with new PIV access.

## 2021-01-22 NOTE — ED Notes (Signed)
Pt arrived from [facility] escorted by [facility]. Pt here for MRI. Pt alert and oriented x4. History of stroke but NIH is zero at this time.

## 2021-01-22 NOTE — ED Notes (Signed)
Per CT tech, Pts PIV infiltrated while in CT, they were unable to start another PIV.  This RN attempted 1 start x1, with no success.  Will request another RN to attempt PIV start.

## 2021-01-22 NOTE — ED Notes (Addendum)
Pt to MRI

## 2021-01-22 NOTE — ED Provider Notes (Signed)
Patient presents with neck pain and headache.  Her medical history is notable for protein S deficiency and prior stroke.  She is followed by neurology outpatient.  She was seen earlier today at [facility] by Dr. Rhunette Croft who spoke with neurology. Neurologist Dr. Selina Cooley recommended the following:   She recommends that in addition to CT angiogram and CT venogram-we get an MRI brain with and without contrast.  If the MRI is negative then patient can continue with her outpatient neurology and oncology follow up.  Advised to have the patient come to [facility] for MRI.  Neurology advised to let the patient follow-up with neurology outpatient if the MRI resulted normal.  Patient MRI brain resulted negative for any acute findings.  CTA and CT venogram without any acute findings concerning for occlusion or thrombus.  I touched base with Dr.Khaliqdina with neurology.  He agrees it is appropriate for patient to be discharged with close follow-up at this time.   Theron Arista, PA-C 01/22/21 2055    Gwyneth Sprout, MD 01/23/21 [phone]

## 2021-02-11 DIAGNOSIS — E663 Overweight: Secondary | ICD-10-CM | POA: Diagnosis not present

## 2021-02-11 DIAGNOSIS — F419 Anxiety disorder, unspecified: Secondary | ICD-10-CM | POA: Diagnosis not present

## 2021-04-15 DIAGNOSIS — E663 Overweight: Secondary | ICD-10-CM | POA: Diagnosis not present

## 2021-04-15 DIAGNOSIS — F419 Anxiety disorder, unspecified: Secondary | ICD-10-CM | POA: Diagnosis not present

## 2021-05-26 NOTE — L&D Delivery Note (Addendum)
OB/GYN Faculty Practice Delivery Note  Angel [name] is a 31 y.o. [phone] s/p SVD at [redacted]w[redacted]d. She was admitted for IOL s/t A2GDM and polyhydramnios.   AROM: 0h 75m with clear fluid GBS Status:  Negative/-- (10/10 0919) Maximum Maternal Temperature:  Temp (24hrs), Avg:97.9 F (36.6 C), Min:97.8 F (36.6 C), Max:98 F (36.7 C)    Labor Progress: Patient arrived at 3 cm dilation and was induced with cytotec, AROM, pitocin.   Delivery Date/Time: 03/15/2022 at [phone] Delivery: Called to room and patient was complete and pushing. Head delivered in Direct OA position. No nuchal cord present. Shoulder and body delivered in usual fashion. Infant with spontaneous cry, placed on mother's abdomen, dried and stimulated. Cord clamped x 2 after 1-minute delay, and cut by FOB. Cord blood drawn. Placenta delivered spontaneously with gentle cord traction. Fundus firm with massage and Pitocin. Labia, perineum, vagina, and cervix inspected with 1st degree right sided periurethral tear found.   Placenta: Delivered intact @ [id number] Complications: None Lacerations: 1st degree right sided periurethral  EBL: 212 Analgesia: epidural    Infant: APGAR (1 MIN):  8 APGAR (5 MINS):  9 APGAR (10 MINS):    Weight: pending  Lowry Ram, MD  PGY-1, [facility]  03/15/2022 8:35 AM  GME ATTESTATION:  I was gloved and available for assistance during delivery. I agree with the findings and the plan of care as documented in the resident's note. I have made changes to documentation as necessary.  Gerlene Fee, DO OB Fellow, [place] [facility] [place] 03/15/2022, 9:18 AM

## 2021-06-09 ENCOUNTER — Telehealth: Payer: Medicaid Other | Admitting: Emergency Medicine

## 2021-06-09 DIAGNOSIS — B9689 Other specified bacterial agents as the cause of diseases classified elsewhere: Secondary | ICD-10-CM

## 2021-06-09 DIAGNOSIS — H109 Unspecified conjunctivitis: Secondary | ICD-10-CM

## 2021-06-09 MED ORDER — POLYMYXIN B-TRIMETHOPRIM 10000-0.1 UNIT/ML-% OP SOLN: 1.0000 [drp] | OPHTHALMIC | 0 refills | Status: AC

## 2021-06-09 NOTE — Progress Notes (Signed)

## 2021-06-09 NOTE — Progress Notes (Signed)
I have spent 5 minutes in review of e-visit questionnaire, review and updating patient chart, medical decision making and response to patient.   Fatimata Talsma, PA-C    

## 2021-06-19 DIAGNOSIS — F419 Anxiety disorder, unspecified: Secondary | ICD-10-CM | POA: Diagnosis not present

## 2021-06-19 DIAGNOSIS — K219 Gastro-esophageal reflux disease without esophagitis: Secondary | ICD-10-CM | POA: Diagnosis not present

## 2021-06-19 DIAGNOSIS — E663 Overweight: Secondary | ICD-10-CM | POA: Diagnosis not present

## 2021-07-17 ENCOUNTER — Telehealth: Payer: Medicaid Other | Admitting: Physician Assistant

## 2021-07-17 DIAGNOSIS — B350 Tinea barbae and tinea capitis: Secondary | ICD-10-CM

## 2021-07-17 MED ORDER — FLUCONAZOLE 150 MG PO TABS: 150.0000 mg | ORAL_TABLET | ORAL | 0 refills | Status: AC

## 2021-07-17 NOTE — Progress Notes (Signed)
E Visit for Rash  We are sorry that you are not feeling well. Here is how we plan to help!  Based upon your presentation it appears you have a fungal infection of the scalp, especially giving close exposure.  I have prescribed: Diflucan 150 mg to take once weekly for 2-4 weeks until resolved.    HOME CARE:  Take cool showers and avoid direct sunlight. Apply cool compress or wet dressings. Take a bath in an oatmeal bath.  Sprinkle content of one Aveeno packet under running faucet with comfortably warm water.  Bathe for 15-20 minutes, 1-2 times daily.  Pat dry with a towel. Do not rub the rash. Use hydrocortisone cream. Take an antihistamine like Benadryl for widespread rashes that itch.  The adult dose of Benadryl is 25-50 mg by mouth 4 times daily. Caution:  This type of medication may cause sleepiness.  Do not drink alcohol, drive, or operate dangerous machinery while taking antihistamines.  Do not take these medications if you have prostate enlargement.  Read package instructions thoroughly on all medications that you take.  GET HELP RIGHT AWAY IF:  Symptoms don't go away after treatment. Severe itching that persists. If you rash spreads or swells. If you rash begins to smell. If it blisters and opens or develops a yellow-brown crust. You develop a fever. You have a sore throat. You become short of breath.  MAKE SURE YOU:  Understand these instructions. Will watch your condition. Will get help right away if you are not doing well or get worse.  Thank you for choosing an e-visit.  Your e-visit answers were reviewed by a board certified advanced clinical practitioner to complete your personal care plan. Depending upon the condition, your plan could have included both over the counter or prescription medications.  Please review your pharmacy choice. Make sure the pharmacy is open so you can pick up prescription now. If there is a problem, you may contact your provider through  [employer] and have the prescription routed to another pharmacy.  Your safety is important to [place]. If you have drug allergies check your prescription carefully.   For the next 24 hours you can use MyChart to ask questions about today's visit, request a non-urgent call back, or ask for a work or school excuse. You will get an email in the next two days asking about your experience. I hope that your e-visit has been valuable and will speed your recovery.

## 2021-07-17 NOTE — Progress Notes (Signed)
I have spent 5 minutes in review of e-visit questionnaire, review and updating patient chart, medical decision making and response to patient.   Levern Kalka Cody Cordell Coke, PA-C    

## 2021-08-17 ENCOUNTER — Telehealth: Payer: Medicaid Other | Admitting: Nurse Practitioner

## 2021-08-17 DIAGNOSIS — R399 Unspecified symptoms and signs involving the genitourinary system: Secondary | ICD-10-CM | POA: Diagnosis not present

## 2021-08-17 MED ORDER — CEPHALEXIN 500 MG PO CAPS: 500.0000 mg | ORAL_CAPSULE | Freq: Two times a day (BID) | ORAL | 0 refills | Status: DC

## 2021-08-17 NOTE — Progress Notes (Signed)

## 2021-08-21 ENCOUNTER — Ambulatory Visit (INDEPENDENT_AMBULATORY_CARE_PROVIDER_SITE_OTHER): Payer: Medicaid Other

## 2021-08-21 ENCOUNTER — Other Ambulatory Visit: Payer: Self-pay

## 2021-08-21 DIAGNOSIS — Z3201 Encounter for pregnancy test, result positive: Secondary | ICD-10-CM | POA: Diagnosis not present

## 2021-08-21 DIAGNOSIS — Z3200 Encounter for pregnancy test, result unknown: Secondary | ICD-10-CM

## 2021-08-21 LAB — POCT URINE PREGNANCY: Preg Test, Ur: POSITIVE — AB

## 2021-08-21 NOTE — Progress Notes (Signed)
..  Ms. Vohra presents today for UPT. She has no unusual complaints. ?LMP:06-04-21 ?   ?OBJECTIVE: Appears well, in no apparent distress.  ?OB History   ? ? Gravida  ?4  ? Para  ?3  ? Term  ?3  ? Preterm  ?   ? AB  ?   ? Living  ?3  ?  ? ? SAB  ?   ? IAB  ?   ? Ectopic  ?   ? Multiple  ?0  ? Live Births  ?3  ?   ?  ?  ? ?Home UPT Result:Positive ?In-Office UPT result:Positive ?I have reviewed the patient's medical, obstetrical, social, and family histories, and medications.  ? ?ASSESSMENT: Positive pregnancy test ? ?PLAN ?Provided Safe medications list ?Prenatal care to be completed at: [facility] ? ? ?

## 2021-08-22 NOTE — Progress Notes (Signed)
Patient was assessed and managed by nursing staff during this encounter. I have reviewed the chart and agree with the documentation and plan. I have also made any necessary editorial changes. ? ?Angel Severin A Castle Lamons, MD ?08/22/2021 8:21 AM   ?

## 2021-08-27 ENCOUNTER — Ambulatory Visit (INDEPENDENT_AMBULATORY_CARE_PROVIDER_SITE_OTHER): Payer: Medicaid Other

## 2021-08-27 VITALS — BP 124/88 | HR 96 | Ht 68.0 in | Wt 288.7 lb

## 2021-08-27 DIAGNOSIS — O3680X0 Pregnancy with inconclusive fetal viability, not applicable or unspecified: Secondary | ICD-10-CM

## 2021-08-27 DIAGNOSIS — O0990 Supervision of high risk pregnancy, unspecified, unspecified trimester: Secondary | ICD-10-CM

## 2021-08-27 DIAGNOSIS — O99119 Other diseases of the blood and blood-forming organs and certain disorders involving the immune mechanism complicating pregnancy, unspecified trimester: Secondary | ICD-10-CM

## 2021-08-27 DIAGNOSIS — Z8673 Personal history of transient ischemic attack (TIA), and cerebral infarction without residual deficits: Secondary | ICD-10-CM

## 2021-08-27 MED ORDER — ENOXAPARIN SODIUM 150 MG/ML IJ SOSY: 150.0000 mg | PREFILLED_SYRINGE | INTRAMUSCULAR | 0 refills | Status: DC

## 2021-08-27 NOTE — Progress Notes (Signed)
Patient started on lovenox 150mg /62ml for hx of ischemic stroke and protein s deficiency.  ?

## 2021-08-27 NOTE — Progress Notes (Cosign Needed)
/  New OB Intake ? ?I connected with  Angel [name] on 08/27/21 at  3:10 PM EDT by in person and verified that I am speaking with the correct person using two identifiers. Nurse is located at [facility] and pt is located at [facility]. ? ?I discussed the limitations, risks, security and privacy concerns of performing an evaluation and management service by telephone and the availability of in person appointments. I also discussed with the patient that there may be a patient responsible charge related to this service. The patient expressed understanding and agreed to proceed. ? ?I explained I am completing New OB Intake today. We discussed her EDD of 04/03/22 that is based on early u/s. Pt is G4/P3003. I reviewed her allergies, medications, Medical/Surgical/OB history, and appropriate screenings. I informed her of [facility] services. Based on history, this is a/an  pregnancy complicated by protein s deficiency, hx of ischemic stroke x 2  .  ? ?Patient Active Problem List  ? Diagnosis Date Noted  ? IUD (intrauterine device) in place 12/24/2019  ? Insulin controlled gestational diabetes mellitus (GDM) in third trimester 09/27/2019  ? Protein S deficiency affecting pregnancy, antepartum (HCC) 09/12/2019  ? History of pre-eclampsia in prior pregnancy, currently pregnant 09/12/2019  ? Maternal obesity affecting pregnancy, antepartum 09/12/2019  ? Supervision of other high risk pregnancy, antepartum 07/12/2019  ? Protein S deficiency (HCC)   ? History of ischemic stroke x2 in 2015 05/13/2014  ? Anemia   ? Migraine 03/13/2014  ? Morbid obesity (HCC) 03/13/2014  ? ? ?Concerns addressed today ? ?Delivery Plans:  ?Plans to deliver at [facility].  ? ?MyChart/Babyscripts ?MyChart access verified. I explained pt will have some visits in office and some virtually. Babyscripts instructions given and order placed. Patient verifies receipt of registration text/e-mail. Account successfully created and app downloaded. ? ?Blood Pressure Cuff   ?Patient has a bp cuff at home from last pregnancy. Explained after first prenatal appt pt will check weekly and document in Babyscripts. ? ?Weight scale: Patient does have weight scale.   ? ?Anatomy US ?Explained first scheduled [place] will be around 19 weeks. Dating and viability scan performed today. ? ?Labs ?Discussed Avelina Laine genetic screening with patient. Would like both Panorama and Horizon drawn at new OB visit.Also if interested in genetic testing, tell patient she will need AFP 15-21 weeks to complete genetic testing .Routine prenatal labs needed. ? ?Covid Vaccine ?Patient has covid vaccine.  ? ?Informed patient of [facility] website  and placed link in her AVS.  ? ?Social Determinants of Health ?Food Insecurity: Patient denies food insecurity. ?WIC Referral: Patient is interested in referral to [facility].  ?Transportation: Patient denies transportation needs. ?Childcare: Discussed no children allowed at ultrasound appointments. Offered childcare services; patient declines childcare services at this time. ? ?Send link to Pregnancy Navigators ? ? ?Placed OB Box on problem list and updated ? ?First visit review ?I reviewed new OB appt with pt. I explained she will have a pelvic exam, ob bloodwork with genetic screening, and PAP smear. Explained pt will be seen by Jaynie Collins at first visit; encounter routed to appropriate provider. Explained that patient will be seen by pregnancy navigator following visit with provider. [facility] information placed in AVS.  ? ?Hamilton Capri, RN ?08/27/2021  3:02 PM  ?

## 2021-08-30 ENCOUNTER — Telehealth: Payer: Medicaid Other | Admitting: Physician Assistant

## 2021-08-30 ENCOUNTER — Telehealth: Payer: Medicaid Other | Admitting: Emergency Medicine

## 2021-08-30 DIAGNOSIS — H5789 Other specified disorders of eye and adnexa: Secondary | ICD-10-CM

## 2021-08-30 DIAGNOSIS — M436 Torticollis: Secondary | ICD-10-CM

## 2021-08-30 DIAGNOSIS — J029 Acute pharyngitis, unspecified: Secondary | ICD-10-CM

## 2021-08-30 NOTE — Progress Notes (Signed)
Based on what you shared with me, I feel your condition warrants further evaluation and I recommend that you be seen in a face to face visit. Because you are having neck stiffness as well as throat pain in addition to your eye symptoms, I do feel it would be best if you were evaluated in person ?  ?NOTE: There will be NO CHARGE for this eVisit ?  ?If you are having a true medical emergency please call 911.   ?  ? For an urgent face to face visit, [place] has six urgent care centers for your convenience:  ?  ? [facility] ?Get Driving Directions ?[phone] ?[phone] [address] ?[place], [place] [address] ?  ? [facility]) ?Get Driving Directions ?[phone] ?[address] ?[place], [place] [address] ? ?[facility]) ?Get Driving Directions ?[phone] ?[address] ?[place],  [place]  [address] ? ?[facility] ?Get Driving Directions ?[phone] ?[address] ?[place], [place] [address] ?  ?[facility] [place] ?Get Driving Directions  ?[phone] ?[address] ?[address] ?[place], [place] [address] ?  ?[facility] ?Get Driving Directions ?[phone] ?[address] ?[place], [place] [address] ? ?Your MyChart E-visit questionnaire answers were reviewed by a board certified advanced clinical practitioner to complete your personal care plan based on your specific symptoms.  Thank you for using e-Visits. ?  ? ?

## 2021-08-30 NOTE — Progress Notes (Signed)
Based on what you shared with me, I feel your condition warrants further evaluation and I recommend that you be seen in a face to face visit. ? ?Being that you are having neck stiffness as well as throat pain, I do feel it would be best if you were evaluated in person to rule out other sources of these symptoms, like a tonsillar abscess.  ?  ?NOTE: There will be NO CHARGE for this eVisit ?  ?If you are having a true medical emergency please call 911.   ?  ? For an urgent face to face visit, [place] has six urgent care centers for your convenience:  ?  ? [facility] [place] [facility] ?Get Driving Directions ?[phone] ?[address] ?[place], [place] [address] ?  ? [facility]) ?Get Driving Directions ?[phone] ?[address] ?[place], [place] [address] ? ?[facility] [place] [facility] ?Get Driving Directions ?[place],  [place]  [address] ? ?[facility] ?Get Driving Directions ?[phone] ?[address] ?[place], [place] [address] ?  ?[facility] [place] ?Get Driving Directions  ?[phone] ?[address] ?[address] ?[place], [place] [address] ?  ? Urgent Care at [facility] ?Get Driving Directions ?[phone] ?[address] ?[place], [place] [address] ? ?Your MyChart E-visit questionnaire answers were reviewed by a board certified advanced clinical practitioner to complete your personal care plan based on your specific symptoms.  Thank you for using e-Visits. ?  ?I provided 5 minutes of non face-to-face time during this encounter for chart review and documentation.  ? ?

## 2021-09-02 NOTE — Progress Notes (Signed)
Patient was assessed and managed by nursing staff during this encounter. I have reviewed the chart and agree with the documentation and plan. I have also made any necessary editorial changes. ? ?Emeterio Reeve, MD ?09/02/2021 3:24 PM  ? ?

## 2021-09-13 ENCOUNTER — Encounter: Payer: Medicaid Other | Admitting: Obstetrics & Gynecology

## 2021-09-18 ENCOUNTER — Other Ambulatory Visit: Payer: Self-pay

## 2021-09-18 ENCOUNTER — Other Ambulatory Visit: Payer: Self-pay | Admitting: Obstetrics & Gynecology

## 2021-09-18 ENCOUNTER — Encounter: Payer: Self-pay | Admitting: Obstetrics & Gynecology

## 2021-09-18 DIAGNOSIS — O0990 Supervision of high risk pregnancy, unspecified, unspecified trimester: Secondary | ICD-10-CM

## 2021-09-18 DIAGNOSIS — D6859 Other primary thrombophilia: Secondary | ICD-10-CM

## 2021-09-18 DIAGNOSIS — Z8673 Personal history of transient ischemic attack (TIA), and cerebral infarction without residual deficits: Secondary | ICD-10-CM

## 2021-09-18 DIAGNOSIS — O99119 Other diseases of the blood and blood-forming organs and certain disorders involving the immune mechanism complicating pregnancy, unspecified trimester: Secondary | ICD-10-CM

## 2021-09-18 MED ORDER — BLOOD PRESSURE KIT DEVI: 1.0000 | 0 refills | Status: DC | PRN

## 2021-09-19 ENCOUNTER — Other Ambulatory Visit: Payer: Self-pay | Admitting: Obstetrics & Gynecology

## 2021-09-19 DIAGNOSIS — O0990 Supervision of high risk pregnancy, unspecified, unspecified trimester: Secondary | ICD-10-CM

## 2021-09-19 DIAGNOSIS — Z8673 Personal history of transient ischemic attack (TIA), and cerebral infarction without residual deficits: Secondary | ICD-10-CM

## 2021-09-19 DIAGNOSIS — O99119 Other diseases of the blood and blood-forming organs and certain disorders involving the immune mechanism complicating pregnancy, unspecified trimester: Secondary | ICD-10-CM

## 2021-09-20 ENCOUNTER — Other Ambulatory Visit: Payer: Self-pay

## 2021-09-20 ENCOUNTER — Other Ambulatory Visit: Payer: Self-pay | Admitting: Obstetrics & Gynecology

## 2021-09-20 ENCOUNTER — Telehealth: Payer: Self-pay

## 2021-09-20 DIAGNOSIS — Z8673 Personal history of transient ischemic attack (TIA), and cerebral infarction without residual deficits: Secondary | ICD-10-CM

## 2021-09-20 DIAGNOSIS — O0990 Supervision of high risk pregnancy, unspecified, unspecified trimester: Secondary | ICD-10-CM

## 2021-09-20 DIAGNOSIS — O99119 Other diseases of the blood and blood-forming organs and certain disorders involving the immune mechanism complicating pregnancy, unspecified trimester: Secondary | ICD-10-CM

## 2021-09-20 MED ORDER — ENOXAPARIN SODIUM 150 MG/ML IJ SOSY: 150.0000 mg | PREFILLED_SYRINGE | INTRAMUSCULAR | 0 refills | Status: DC

## 2021-09-20 NOTE — Telephone Encounter (Signed)
Attempted to call and advise that refill was sent, no answer, unable to leave vm. ?

## 2021-09-23 ENCOUNTER — Encounter: Payer: Self-pay | Admitting: Obstetrics & Gynecology

## 2021-10-15 ENCOUNTER — Encounter: Payer: Self-pay | Admitting: Obstetrics & Gynecology

## 2021-10-15 ENCOUNTER — Ambulatory Visit (INDEPENDENT_AMBULATORY_CARE_PROVIDER_SITE_OTHER): Payer: Medicaid Other | Admitting: Obstetrics & Gynecology

## 2021-10-15 ENCOUNTER — Other Ambulatory Visit (HOSPITAL_COMMUNITY)
Admission: RE | Admit: 2021-10-15 | Discharge: 2021-10-15 | Disposition: A | Discharge status: Home / Self Care | Payer: Medicaid Other | Source: Ambulatory Visit | Attending: Obstetrics & Gynecology | Admitting: Obstetrics & Gynecology

## 2021-10-15 VITALS — BP 134/84 | HR 108 | Wt 304.0 lb

## 2021-10-15 DIAGNOSIS — O09299 Supervision of pregnancy with other poor reproductive or obstetric history, unspecified trimester: Secondary | ICD-10-CM | POA: Diagnosis not present

## 2021-10-15 DIAGNOSIS — Z3A15 15 weeks gestation of pregnancy: Secondary | ICD-10-CM | POA: Diagnosis not present

## 2021-10-15 DIAGNOSIS — O0990 Supervision of high risk pregnancy, unspecified, unspecified trimester: Secondary | ICD-10-CM

## 2021-10-15 DIAGNOSIS — O99119 Other diseases of the blood and blood-forming organs and certain disorders involving the immune mechanism complicating pregnancy, unspecified trimester: Secondary | ICD-10-CM | POA: Diagnosis not present

## 2021-10-15 DIAGNOSIS — R519 Headache, unspecified: Secondary | ICD-10-CM

## 2021-10-15 DIAGNOSIS — O99210 Obesity complicating pregnancy, unspecified trimester: Secondary | ICD-10-CM | POA: Insufficient documentation

## 2021-10-15 DIAGNOSIS — Z8673 Personal history of transient ischemic attack (TIA), and cerebral infarction without residual deficits: Secondary | ICD-10-CM | POA: Diagnosis not present

## 2021-10-15 DIAGNOSIS — D6859 Other primary thrombophilia: Secondary | ICD-10-CM

## 2021-10-15 DIAGNOSIS — Z8632 Personal history of gestational diabetes: Secondary | ICD-10-CM

## 2021-10-15 DIAGNOSIS — O26892 Other specified pregnancy related conditions, second trimester: Secondary | ICD-10-CM

## 2021-10-15 DIAGNOSIS — Z3482 Encounter for supervision of other normal pregnancy, second trimester: Secondary | ICD-10-CM | POA: Diagnosis not present

## 2021-10-15 MED ORDER — ASPIRIN 81 MG PO TBEC: 81.0000 mg | DELAYED_RELEASE_TABLET | Freq: Every day | ORAL | 2 refills | Status: DC

## 2021-10-15 MED ORDER — ENOXAPARIN SODIUM 150 MG/ML IJ SOSY: 150.0000 mg | PREFILLED_SYRINGE | INTRAMUSCULAR | 5 refills | Status: DC

## 2021-10-15 MED ORDER — METOCLOPRAMIDE HCL 10 MG PO TABS: 10.0000 mg | ORAL_TABLET | Freq: Four times a day (QID) | ORAL | 2 refills | Status: DC | PRN

## 2021-10-15 MED ORDER — CAFFEINE 200 MG PO TABS: 200.0000 mg | ORAL_TABLET | ORAL | 1 refills | Status: DC | PRN

## 2021-10-15 NOTE — Progress Notes (Signed)
History:   Angel [name] is a 31 y.o. [phone] at 12w5dby eight week ultrasound not consistent with LMP being seen today for her first obstetrical visit.  Her obstetrical history is significant for: Patient Active Problem List   Diagnosis Date Noted   Supervision of high risk pregnancy, antepartum 08/27/2021   H/O gestational diabetes in prior pregnancy, currently pregnant 09/27/2019   Protein S deficiency affecting pregnancy, antepartum ([place] 09/12/2019   History of pre-eclampsia in prior pregnancy, currently pregnant 09/12/2019   Maternal morbid obesity, antepartum ([place] 09/12/2019   Protein S deficiency (HPolo    History of ischemic stroke x2 in 2015 05/13/2014   Anemia    Migraine 03/13/2014   Morbid obesity ([place] 03/13/2014  Patient does intend to breast feed. Pregnancy history fully reviewed.  Patient reports headache, but only taking 2-3 325 mg tablets of acetaminophen daily. Has history of migraines. No other concerns.     HISTORY: OB History  Gravida Para Term Preterm AB Living  4 3 3  0 0 3  SAB IAB Ectopic Multiple Live Births  0 0 0 0 3    # Outcome Date GA Lbr Len/2nd Weight Sex Delivery Anes PTL Lv  4 Current           3 Term 12/24/19 340w1d6:32 / 00:01 6 lb 4.2 oz (2.841 kg) M Vag-Spont EPI  LIV     Birth Comments: WDL     Name[facility]   Apgar1: 9  Apgar5: 9  2 Term 01/29/18    F Vag-Spont   LIV  1 Term 08/18/11    F Vag-Spont   LIV    Last pap smear was done 07/2019 and was normal  Past Medical History:  Diagnosis Date   Acute ischemic stroke (HCPier[place]eton12/19/2015   Anemia    Anxiety    Gestational diabetes    Headache    Hyperlipidemia    Migraine 03/13/2014   Morbid obesity (HCD[place]10/19/2015   Obesity    Protein S deficiency (HCIona   Past Surgical History:  Procedure Laterality Date   none     TEE WITHOUT CARDIOVERSION N/A 05/17/2014   Procedure: TRANSESOPHAGEAL ECHOCARDIOGRAM (TEE);  Surgeon: PeJosue HectorMD;  [facility];  Service: Cardiovascular;  Laterality: N/A;   Family History  Problem Relation Age of Onset   Hypertension Mother    Heart disease Maternal Grandmother    Heart disease Maternal Grandfather    Diabetes Paternal Grandmother    Cancer Paternal Uncle    Diabetes Paternal Uncle    Social History   Tobacco Use   Smoking status: Former   Smokeless tobacco: Never  VaScientific laboratory technicianse: Never used  Substance Use Topics   Alcohol use: Yes    Comment: Occasionally, not since confirmed pregnancy   Drug use: No   No Known Allergies Current Outpatient Medications on File Prior to Visit  Medication Sig Dispense Refill   acetaminophen (TYLENOL) 325 MG tablet Take 2 tablets (650 mg total) by mouth every 6 (six) hours as needed (for pain scale < 4). 30 tablet 0   Blood Pressure Monitoring (BLOOD PRESSURE KIT) DEVI 1 kit by Does not apply route as needed. (Patient not taking: Reported on 08/21/2021) 1 each 0   Blood Pressure Monitoring (BLOOD PRESSURE KIT) DEVI 1 kit by Does not apply route as needed. 1 each 0   enoxaparin (LOVENOX) 150 MG/ML injection Inject 1 mL (150  mg total) into the skin daily. 21 mL 0   Prenatal MV & Min w/FA-DHA (PRENATAL GUMMIES) 0.18-25 MG CHEW Chew by mouth.     No current facility-administered medications on file prior to visit.    Review of Systems Pertinent items noted in HPI and remainder of comprehensive ROS otherwise negative.  Physical Exam:   Vitals:   10/15/21 0822  BP: 134/84  Pulse: (!) 108  Weight: (!) 304 lb (137.9 kg)   Fetal Heart Rate (bpm): 153   General: well-developed, well-nourished female in no acute distress  Breasts:  deferred  Skin: normal coloration and turgor, no rashes  Neurologic: oriented, normal, negative, normal mood  Extremities: normal strength, tone, and muscle mass, ROM of all joints is normal  HEENT PERRLA, extraocular movement intact and sclera clear, anicteric  Neck supple and no masses  Cardiovascular:  regular rate and rhythm  Respiratory:  no respiratory distress, normal breath sounds  Abdomen: soft, non-tender; bowel sounds normal; no masses,  no organomegaly  Pelvic: deferred     Assessment:    Pregnancy: [id number] Patient Active Problem List   Diagnosis Date Noted   Supervision of high risk pregnancy, antepartum 08/27/2021   H/O gestational diabetes in prior pregnancy, currently pregnant 09/27/2019   Protein S deficiency affecting pregnancy, antepartum ([place]) 09/12/2019   History of pre-eclampsia in prior pregnancy, currently pregnant 09/12/2019   Maternal morbid obesity, antepartum ([place]) 09/12/2019   Protein S deficiency ([place])    History of ischemic stroke x2 in 2015 05/13/2014   Anemia    Migraine 03/13/2014   Morbid obesity ([place]) 03/13/2014     Plan:    1. History of ischemic stroke x 2 in 2015 2. Protein S deficiency affecting pregnancy, antepartum ([place]) Already on Lovenox 150 mg [place] daily, will continue for now.  MFM referral made, also to [facility]; will follow up recommendations. Anti-Xa level checked with labs today. Continue close monitoring. - [place] MFM OB DETAIL +14 WK; Future - AMB referral to maternal fetal medicine - AMB Referral to Cardio Obstetrics - Heparin Anti-Xa  3. H/O gestational diabetes in prior pregnancy, currently pregnant Patient aware of need for early GTT if A1C is concerning, had insulin dependent GDM last pregnancy. - HgB A1c  4. History of pre-eclampsia in prior pregnancy, currently pregnant BP cuff prescribed, will check BP weekly.  Enrolled in Babyscripts.  Baseline labs to be checked. ASA prescribed for prevention.  - Protein / creatinine ratio, urine - Comprehensive metabolic panel - aspirin EC 81 MG tablet; Take 1 tablet (81 mg total) by mouth daily. Take after 12 weeks for prevention of preeclampsia later in pregnancy  Dispense: 300 tablet; Refill: 2 -Enroll Patient in PreNatal Babyscripts  5. Maternal morbid obesity, antepartum  (HCC) TWG 11-20 lbs recommended. Nutritionist referral done. Labs checked.ASA prescribed.  - [place] MFM OB DETAIL +14 WK; Future - AMB referral to maternal fetal medicine - TSH Rfx on Abnormal to Free T4 - Protein / creatinine ratio, urine - Comprehensive metabolic panel - aspirin EC 81 MG tablet; Take 1 tablet (81 mg total) by mouth daily. Take after 12 weeks for prevention of preeclampsia later in pregnancy  Dispense: 300 tablet; Refill: 2 - AMB Referral to Cardio Obstetrics - Amb Referral to Nutrition and Diabetic Education  6. Headache in pregnancy, second trimester Recommended up to 1000 mg of Tylenol q6h as needed and adequate hydration. Also prescribed Caffeine and Reglan, will monitor response.  - caffeine 200 MG TABS tablet; Take 1  tablet (200 mg total) by mouth every 4 (four) hours as needed (headache).  Dispense: 30 tablet; Refill: 1 - metoCLOPramide (REGLAN) 10 MG tablet; Take 1 tablet (10 mg total) by mouth 4 (four) times daily as needed for nausea or vomiting (headache).  Dispense: 30 tablet; Refill: 2  7. [redacted] weeks gestation of pregnancy 8. Supervision of high risk pregnancy, antepartum - CBC/D/Plt+RPR+Rh+ABO+RubIgG... - Genetic Screening - Culture, OB Urine - Cervicovaginal ancillary only( [place]) - AFP, Serum, Open Spina Bifida - [place] MFM OB DETAIL +14 WK; Future - AMB referral to maternal fetal medicine Initial labs drawn. Continue prenatal vitamins. Problem list reviewed and updated. Genetic Screening discussed, Panorama: ordered. Ultrasound discussed; fetal anatomic survey: ordered. Anticipatory guidance about prenatal visits given including labs, ultrasounds, and testing. Discussed usage of the Babyscripts app for more information about pregnancy, and to track blood pressures. Also discussed usage of virtual visits as additional source of managing and completing prenatal visits.   Patient was encouraged to use MyChart to review results, send requests, and have  questions addressed.   The nature of [place] [facility] with multiple MDs and Advanced Practice Providers was explained to patient; also emphasized that residents, students are part of our team. Routine obstetric precautions reviewed. Encouraged to seek out care at office or emergency room [facility] preferred) for urgent and/or emergent concerns. Return in about 4 weeks (around 11/12/2021) for OFFICE OB VISIT (MD only).     Verita Schneiders, MD, [place] for [employer], [place]

## 2021-10-15 NOTE — Progress Notes (Signed)
HR NOB/AFP.  Reports no problems today.

## 2021-10-16 LAB — PROTEIN / CREATININE RATIO, URINE
Creatinine, Urine: 160.2 mg/dL
Protein, Ur: 14.6 mg/dL
Protein/Creat Ratio: 91 mg/g creat (ref 0–200)

## 2021-10-16 LAB — CERVICOVAGINAL ANCILLARY ONLY
Chlamydia: NEGATIVE
Comment: NEGATIVE
Comment: NEGATIVE
Comment: NORMAL
Neisseria Gonorrhea: NEGATIVE
Trichomonas: NEGATIVE

## 2021-10-17 ENCOUNTER — Encounter: Payer: Self-pay | Admitting: Obstetrics & Gynecology

## 2021-10-17 DIAGNOSIS — R7303 Prediabetes: Secondary | ICD-10-CM | POA: Insufficient documentation

## 2021-10-17 LAB — CBC/D/PLT+RPR+RH+ABO+RUBIGG...
Antibody Screen: NEGATIVE
Basophils Absolute: 0 10*3/uL (ref 0.0–0.2)
Basos: 0 %
EOS (ABSOLUTE): 0 10*3/uL (ref 0.0–0.4)
Eos: 0 %
HCV Ab: NONREACTIVE
HIV Screen 4th Generation wRfx: NONREACTIVE
Hematocrit: 34.2 % (ref 34.0–46.6)
Hemoglobin: 11.6 g/dL (ref 11.1–15.9)
Hepatitis B Surface Ag: NEGATIVE
Immature Grans (Abs): 0.1 10*3/uL (ref 0.0–0.1)
Immature Granulocytes: 1 %
Lymphocytes Absolute: 2.5 10*3/uL (ref 0.7–3.1)
Lymphs: 28 %
MCH: 29.1 pg (ref 26.6–33.0)
MCHC: 33.9 g/dL (ref 31.5–35.7)
MCV: 86 fL (ref 79–97)
Monocytes Absolute: 0.4 10*3/uL (ref 0.1–0.9)
Monocytes: 5 %
Neutrophils Absolute: 5.7 10*3/uL (ref 1.4–7.0)
Neutrophils: 66 %
Platelets: 291 10*3/uL (ref 150–450)
RBC: 3.99 x10E6/uL (ref 3.77–5.28)
RDW: 14.6 % (ref 11.7–15.4)
RPR Ser Ql: NONREACTIVE
Rh Factor: POSITIVE
Rubella Antibodies, IGG: 18.9 index (ref >0.99)
WBC: 8.7 10*3/uL (ref 3.4–10.8)

## 2021-10-17 LAB — COMPREHENSIVE METABOLIC PANEL
ALT: 7 IU/L (ref 0–32)
AST: 7 IU/L (ref 0–40)
Albumin/Globulin Ratio: 1 — ABNORMAL LOW (ref 1.2–2.2)
Albumin: 3.5 g/dL — ABNORMAL LOW (ref 3.8–4.8)
Alkaline Phosphatase: 71 IU/L (ref 44–121)
BUN/Creatinine Ratio: 14 (ref 9–23)
BUN: 8 mg/dL (ref 6–20)
Bilirubin Total: <0.2 mg/dL (ref 0.0–1.2)
CO2: 19 mmol/L — ABNORMAL LOW (ref 20–29)
Calcium: 9.1 mg/dL (ref 8.7–10.2)
Chloride: 103 mmol/L (ref 96–106)
Creatinine, Ser: 0.59 mg/dL (ref 0.57–1.00)
Globulin, Total: 3.6 g/dL (ref 1.5–4.5)
Glucose: 101 mg/dL — ABNORMAL HIGH (ref 70–99)
Potassium: 3.9 mmol/L (ref 3.5–5.2)
Sodium: 134 mmol/L (ref 134–144)
Total Protein: 7.1 g/dL (ref 6.0–8.5)
eGFR: 123 mL/min/{1.73_m2} (ref >59)

## 2021-10-17 LAB — AFP, SERUM, OPEN SPINA BIFIDA
AFP MoM: 1.69
AFP Value: 42.5 ng/mL
Gest. Age on Collection Date: 15.7 weeks
Maternal Age At EDD: 32.5 yr
OSBR Risk 1 IN: 3342
Test Results:: NEGATIVE
Weight: 288 [lb_av]

## 2021-10-17 LAB — HEPARIN ANTI-XA: Heparin Anti-Xa: 0.15 IU/mL

## 2021-10-17 LAB — HEMOGLOBIN A1C
Est. average glucose Bld gHb Est-mCnc: 131 mg/dL
Hgb A1c MFr Bld: 6.2 % — ABNORMAL HIGH (ref 4.8–5.6)

## 2021-10-17 LAB — HCV INTERPRETATION

## 2021-10-17 LAB — TSH RFX ON ABNORMAL TO FREE T4: TSH: 1.83 u[IU]/mL (ref 0.450–4.500)

## 2021-10-17 NOTE — Progress Notes (Signed)
The hemoglobin A1C of 6.2 at [redacted] weeks gestation is indicative of prediabetes and she has a history of insulin dependent GDM last pregnancy.  Early 2 hr GTT recommended to test for GDM. Please call to inform patient of results and recommendations. Problem list updated.  Jaynie Collins, MD

## 2021-10-18 ENCOUNTER — Telehealth: Payer: Self-pay | Admitting: Emergency Medicine

## 2021-10-18 ENCOUNTER — Encounter: Payer: Self-pay | Admitting: Emergency Medicine

## 2021-10-18 NOTE — Telephone Encounter (Signed)
Attempt to call pt to discuss results. Mychart message sent to pt.

## 2021-10-20 LAB — CULTURE, OB URINE

## 2021-10-20 LAB — URINE CULTURE, OB REFLEX

## 2021-10-22 ENCOUNTER — Encounter: Payer: Self-pay | Admitting: Obstetrics & Gynecology

## 2021-10-25 ENCOUNTER — Other Ambulatory Visit: Payer: Medicaid Other

## 2021-10-25 DIAGNOSIS — O0990 Supervision of high risk pregnancy, unspecified, unspecified trimester: Secondary | ICD-10-CM | POA: Diagnosis not present

## 2021-10-26 LAB — GLUCOSE TOLERANCE, 2 HOURS W/ 1HR
Glucose, 1 hour: 135 mg/dL (ref 70–179)
Glucose, 2 hour: 103 mg/dL (ref 70–152)
Glucose, Fasting: 88 mg/dL (ref 70–91)

## 2021-11-12 ENCOUNTER — Encounter: Payer: Medicaid Other | Admitting: Obstetrics & Gynecology

## 2021-11-14 ENCOUNTER — Encounter: Payer: Self-pay | Admitting: Obstetrics and Gynecology

## 2021-11-14 ENCOUNTER — Ambulatory Visit (INDEPENDENT_AMBULATORY_CARE_PROVIDER_SITE_OTHER): Payer: Medicaid Other | Admitting: Obstetrics and Gynecology

## 2021-11-14 VITALS — BP 122/78 | HR 89 | Wt 311.0 lb

## 2021-11-14 DIAGNOSIS — Z3A20 20 weeks gestation of pregnancy: Secondary | ICD-10-CM

## 2021-11-14 DIAGNOSIS — O99210 Obesity complicating pregnancy, unspecified trimester: Secondary | ICD-10-CM

## 2021-11-14 DIAGNOSIS — O09299 Supervision of pregnancy with other poor reproductive or obstetric history, unspecified trimester: Secondary | ICD-10-CM

## 2021-11-14 DIAGNOSIS — Z8632 Personal history of gestational diabetes: Secondary | ICD-10-CM

## 2021-11-14 DIAGNOSIS — O99119 Other diseases of the blood and blood-forming organs and certain disorders involving the immune mechanism complicating pregnancy, unspecified trimester: Secondary | ICD-10-CM

## 2021-11-14 DIAGNOSIS — Z8673 Personal history of transient ischemic attack (TIA), and cerebral infarction without residual deficits: Secondary | ICD-10-CM

## 2021-11-14 DIAGNOSIS — D6859 Other primary thrombophilia: Secondary | ICD-10-CM

## 2021-11-14 DIAGNOSIS — Z6841 Body Mass Index (BMI) 40.0 and over, adult: Secondary | ICD-10-CM

## 2021-11-14 DIAGNOSIS — O0990 Supervision of high risk pregnancy, unspecified, unspecified trimester: Secondary | ICD-10-CM

## 2021-11-14 NOTE — Progress Notes (Signed)
Pt presents for ROB without complaints per pt. Anatomy scheduled 11/27/21

## 2021-11-18 ENCOUNTER — Telehealth: Payer: Self-pay

## 2021-11-18 NOTE — Telephone Encounter (Signed)
Called pt after babyscripts alerted elevated BP of 147/82. No answer, vm is not setup

## 2021-11-19 ENCOUNTER — Encounter: Payer: Self-pay | Admitting: Obstetrics and Gynecology

## 2021-11-19 ENCOUNTER — Ambulatory Visit: Payer: Medicaid Other

## 2021-11-27 ENCOUNTER — Ambulatory Visit: Payer: Medicaid Other | Attending: Obstetrics & Gynecology

## 2021-11-27 ENCOUNTER — Ambulatory Visit: Payer: Medicaid Other | Admitting: *Deleted

## 2021-11-27 ENCOUNTER — Other Ambulatory Visit: Payer: Self-pay | Admitting: *Deleted

## 2021-11-27 ENCOUNTER — Encounter: Payer: Self-pay | Admitting: *Deleted

## 2021-11-27 ENCOUNTER — Ambulatory Visit (HOSPITAL_BASED_OUTPATIENT_CLINIC_OR_DEPARTMENT_OTHER): Payer: Medicaid Other | Admitting: Maternal & Fetal Medicine

## 2021-11-27 VITALS — BP 129/64 | HR 101

## 2021-11-27 DIAGNOSIS — O0990 Supervision of high risk pregnancy, unspecified, unspecified trimester: Secondary | ICD-10-CM | POA: Diagnosis not present

## 2021-11-27 DIAGNOSIS — O99210 Obesity complicating pregnancy, unspecified trimester: Secondary | ICD-10-CM | POA: Insufficient documentation

## 2021-11-27 DIAGNOSIS — Z8673 Personal history of transient ischemic attack (TIA), and cerebral infarction without residual deficits: Secondary | ICD-10-CM

## 2021-11-27 DIAGNOSIS — O99119 Other diseases of the blood and blood-forming organs and certain disorders involving the immune mechanism complicating pregnancy, unspecified trimester: Secondary | ICD-10-CM | POA: Insufficient documentation

## 2021-11-27 DIAGNOSIS — O99212 Obesity complicating pregnancy, second trimester: Secondary | ICD-10-CM

## 2021-11-27 DIAGNOSIS — O280 Abnormal hematological finding on antenatal screening of mother: Secondary | ICD-10-CM

## 2021-11-27 DIAGNOSIS — D6859 Other primary thrombophilia: Secondary | ICD-10-CM

## 2021-11-27 NOTE — Progress Notes (Signed)
MFM Consultation  Angel [name] is a 31 yo G4P3 who is seen today at 44 w 6 d with an EDD of 04/03/22 at the requests of Dr. Verita Schneiders.  She is seen given a history of stroke in 2015.   Angel [name] reported that in 2015 she had lost her vision on her left side. She had a history of bad migraines.  She saw a neuorlogist who through imaging discovered that she likely had two prior strokes. She was placed on anticoagulation and had a thrombophilia evaluation which was normal, with exception of a mild protein S deficiency.  Since that time her vision and weakness has returned to normal. Her headaches have also improved.  In addition, she has had two prior uncomplicated healthy pregnancies that were managed with therapeutic Lovenox antepartum and postpartum.   She is seen today on therapeutic dosing of Lovenox. She had having her anti-Xa levels checked as well.   She expressed an understanding of the current management and wants to continue.     11/27/2021    9:33 AM 11/14/2021    8:31 AM 10/15/2021    8:22 AM  Vitals with BMI  Weight  311 lbs 793 lbs  Systolic 903 009 233  Diastolic 64 78 84  Pulse 007 89 108      Latest Ref Rng & Units 10/15/2021    8:48 AM 01/22/2021   11:40 AM 12/24/2019   10:28 AM  CBC  WBC 3.4 - 10.8 x10E3/uL 8.7  7.7  9.2   Hemoglobin 11.1 - 15.9 g/dL 11.6  11.4  10.0   Hematocrit 34.0 - 46.6 % 34.2  36.1  32.1   Platelets 150 - 450 x10E3/uL 291  377  329       Latest Ref Rng & Units 10/15/2021    8:48 AM 01/22/2021   11:40 AM 08/12/2019   10:26 AM  CMP  Glucose 70 - 99 mg/dL 101  91  85   BUN 6 - 20 mg/dL 8  9  9    Creatinine 0.57 - 1.00 mg/dL 0.59  0.70  0.56   Sodium 134 - 144 mmol/L 134  138  136   Potassium 3.5 - 5.2 mmol/L 3.9  4.0  4.0   Chloride 96 - 106 mmol/L 103  104  104   CO2 20 - 29 mmol/L 19  27  17    Calcium 8.7 - 10.2 mg/dL 9.1  9.5  9.0   Total Protein 6.0 - 8.5 g/dL 7.1   6.9   Total Bilirubin 0.0 - 1.2 mg/dL <0.2   <0.2   Alkaline Phos  44 - 121 IU/L 71   78   AST 0 - 40 IU/L 7   6   ALT 0 - 32 IU/L 7   6    Past Medical History:  Diagnosis Date   Acute ischemic stroke (HCC) 05/13/2014   Anemia    Anxiety    Gestational diabetes    Headache    Hyperlipidemia    Migraine 03/13/2014   Morbid obesity ([place]) 03/13/2014   Obesity    Prediabetes    Protein S deficiency ([place])    Past Surgical History:  Procedure Laterality Date   none     TEE WITHOUT CARDIOVERSION N/A 05/17/2014   Procedure: TRANSESOPHAGEAL ECHOCARDIOGRAM (TEE);  Surgeon: Josue Hector, MD;  Location: [facility] ENDOSCOPY;  Service: Cardiovascular;  Laterality: N/A;       Current Outpatient Medications (Analgesics):    acetaminophen (TYLENOL)  325 MG tablet, Take 2 tablets (650 mg total) by mouth every 6 (six) hours as needed (for pain scale < 4). (Patient not taking: Reported on 11/14/2021)   aspirin EC 81 MG tablet, Take 1 tablet (81 mg total) by mouth daily. Take after 12 weeks for prevention of preeclampsia later in pregnancy  Current Outpatient Medications (Hematological):    enoxaparin (LOVENOX) 150 MG/ML injection, Inject 1 mL (150 mg total) into the skin daily.   enoxaparin (LOVENOX) 150 MG/ML injection, Inject 1 mL (150 mg total) into the skin daily.  Current Outpatient Medications (Other):    Blood Pressure Monitoring (BLOOD PRESSURE KIT) DEVI, 1 kit by Does not apply route as needed. (Patient not taking: Reported on 08/21/2021)   Blood Pressure Monitoring (BLOOD PRESSURE KIT) DEVI, 1 kit by Does not apply route as needed.   caffeine 200 MG TABS tablet, Take 1 tablet (200 mg total) by mouth every 4 (four) hours as needed (headache). (Patient not taking: Reported on 11/14/2021)   metoCLOPramide (REGLAN) 10 MG tablet, Take 1 tablet (10 mg total) by mouth 4 (four) times daily as needed for nausea or vomiting (headache). (Patient not taking: Reported on 11/14/2021)   omeprazole (PRILOSEC) 40 MG capsule, Take 40 mg by mouth daily.   Prenatal MV & Min  w/FA-DHA (PRENATAL GUMMIES) 0.18-25 MG CHEW, Chew by mouth. Social History   Socioeconomic History   Marital status: Married    Spouse name: Not on file   Number of children: 1   Years of education: college   Highest education level: Not on file  Occupational History    Employer: OTHER    Comment: [employer]  Tobacco Use   Smoking status: Former   Smokeless tobacco: Never  Scientific laboratory technician Use: Never used  Substance and Sexual Activity   Alcohol use: Yes    Comment: Occasionally, not since confirmed pregnancy   Drug use: No   Sexual activity: Yes    Partners: Male    Birth control/protection: None  Other Topics Concern   Not on file  Social History Narrative   Patient lives at home with family.   Caffeine Use: 2 sodas daily   Social Determinants of Health   Financial Resource Strain: Not on file  Food Insecurity: Not on file  Transportation Needs: Not on file  Physical Activity: Not on file  Stress: Not on file  Social Connections: Not on file  Intimate Partner Violence: Not on file   Family History  Problem Relation Age of Onset   Hypertension Mother    Heart disease Maternal Grandmother    Heart disease Maternal Grandfather    Diabetes Paternal Grandmother    Cancer Paternal Uncle    Diabetes Paternal Uncle    No Known Allergies   Imaging: Single intrauterine pregnancy here for a detailed anatomy due to type 2 diabetes Normal anatomy with measurements consistent with dates There is good fetal movement and amniotic fluid volume Suboptimal views of the fetal anatomy were obtained secondary to fetal position.   Impression/Counseling:  I discussed with Angel [name] today's findings and reviewed her history.  She has a history of stroke seemingly related to severe migraines. She had  a negative thrombophilia evaluation. She had two prior pregnancies with good outcomes being managed on therapeutic Lovenox.  She has a management plan for this  pregnancy in place of Therapeutic Lovenox for antepartum and 6 week postpartum with serial anti-Xa measurements.  We reviewed the risk and  benefits of anticoagulation in pregnancy.  We recommend therapeutic anticoagulation to treat a DVT in pregnancy. Specifically, we recommend Lovenox 12m/kg SQ BID with or without transition to heparin.   Lovenox efficacy should be monitored via anti-Xa levels drawn 4-6 hours after a dose, with target range of 0.6-1 IU. Heparin dosing should be titrated to achieve an aPTT of 1.5-2.5 times the upper limit of normal. Again, Therapy can be resumed 4-6 hours after a vaginal delivery or 6-12 hours after a cesarean delivery.   We discussed that Protein S deficiency is considered an inherited thrombophilia. Protein S is a cofactor of the protein C system. Activated protein C inactivates factor Va and factor VIIIa at an increased rate, resulting in reduced thrombin generation. Therefore, a deficiency in this predisposes to thromboembolism. Approximately 0.2-0.4% of the general population have this deficiency.   Protein S deficiency is considered a low-risk thrombophilia. In women who have never had a previous thrombotic event, the risk of VTE in pregnancy is 0.1 to 0.8. Because of this low risk, he current AInsurance underwriter(ACOG) recommendation, is surveillance insted of anticoagulation therapy. Anticoagulation may be warranted for individual patients with additional factors (such as prolonged immobility, first degree relative with unprovoked VTE at age <50 years, or prior VTE), which apply to Angel [name].   Secondly, she has a known elevated BMI of 40 kg/m3.  We recommend serial growth at 24/28/32/36 weeks. Initiate weekly testing at 34 weeks with plan for delivery between 39-40 weeks.  We discussed total weight gain of 11-20 lbs.  We discussed the increased risk for gestational diabetes, preeclampsia, fetal macrosomia, cesarean delivery  and thrombosis in women with > 40 BMI.  She will return in 4 weeks to complete the fetal anatomy.  I spent 30 minutes with > 50% in face to face consultation.  CVikki Ports MD

## 2021-11-28 ENCOUNTER — Telehealth: Payer: Self-pay

## 2021-11-28 ENCOUNTER — Other Ambulatory Visit: Payer: Self-pay | Admitting: Obstetrics and Gynecology

## 2021-11-28 ENCOUNTER — Encounter: Payer: Self-pay | Admitting: Family Medicine

## 2021-11-28 DIAGNOSIS — Z8673 Personal history of transient ischemic attack (TIA), and cerebral infarction without residual deficits: Secondary | ICD-10-CM

## 2021-11-28 DIAGNOSIS — O99119 Other diseases of the blood and blood-forming organs and certain disorders involving the immune mechanism complicating pregnancy, unspecified trimester: Secondary | ICD-10-CM

## 2021-11-28 NOTE — Progress Notes (Signed)
Order placed for factor Xa to be drawn on 7/21 at office visit at around 1115

## 2021-11-28 NOTE — Telephone Encounter (Signed)
Called pt back. Appointment made for tomorrow at 1:40pm with Dr. Servando Salina.

## 2021-11-28 NOTE — Telephone Encounter (Signed)
Attempted to call pt to set up an appt for tomorrow. No answer, no voicemail set up to leave a message. Will send a MyChart message.

## 2021-11-29 ENCOUNTER — Ambulatory Visit (INDEPENDENT_AMBULATORY_CARE_PROVIDER_SITE_OTHER): Payer: Medicaid Other | Admitting: Cardiology

## 2021-11-29 ENCOUNTER — Encounter: Payer: Self-pay | Admitting: Cardiology

## 2021-11-29 VITALS — BP 118/78 | HR 102 | Ht 69.0 in | Wt 311.2 lb

## 2021-11-29 DIAGNOSIS — O99210 Obesity complicating pregnancy, unspecified trimester: Secondary | ICD-10-CM | POA: Diagnosis not present

## 2021-11-29 DIAGNOSIS — O24410 Gestational diabetes mellitus in pregnancy, diet controlled: Secondary | ICD-10-CM

## 2021-11-29 DIAGNOSIS — Z8673 Personal history of transient ischemic attack (TIA), and cerebral infarction without residual deficits: Secondary | ICD-10-CM

## 2021-11-29 DIAGNOSIS — E782 Mixed hyperlipidemia: Secondary | ICD-10-CM

## 2021-11-29 DIAGNOSIS — O09299 Supervision of pregnancy with other poor reproductive or obstetric history, unspecified trimester: Secondary | ICD-10-CM | POA: Diagnosis not present

## 2021-11-29 DIAGNOSIS — R7303 Prediabetes: Secondary | ICD-10-CM | POA: Diagnosis not present

## 2021-11-29 NOTE — Progress Notes (Signed)
Cardio-Obstetrics Clinic  Follow Up Note   Date:  11/29/2021   ID:  Angel [name], DOB 1990/12/15, MRN [id number]  PCP:  Lin Landsman, MD   [facility] Providers Cardiologist:  None  Electrophysiologist:  None   { Click to update primary MD,subspecialty MD or APP then REFRESH:1}     Referring MD: Lin Landsman, MD   Chief Complaint: " I am doing well"  History of Present Illness:    Angel [name] is a 31 y.o. female [[id number]] who returns for follow up of    Prior CV Studies Reviewed: The following studies were reviewed today:   Past Medical History:  Diagnosis Date   Acute ischemic stroke ([place]) 05/13/2014   Anemia    Anxiety    Gestational diabetes    Headache    Hyperlipidemia    Migraine 03/13/2014   Morbid obesity ([place]) 03/13/2014   Obesity    Prediabetes    Protein S deficiency ([place])     Past Surgical History:  Procedure Laterality Date   none     TEE WITHOUT CARDIOVERSION N/A 05/17/2014   Procedure: TRANSESOPHAGEAL ECHOCARDIOGRAM (TEE);  Surgeon: Josue Hector, MD;  Location: [facility] ENDOSCOPY;  Service: Cardiovascular;  Laterality: N/A;   { Click here to update PMH, PSH, OB Hx then refresh note  :1}   OB History     Gravida  4   Para  3   Term  3   Preterm      AB      Living  3      SAB      IAB      Ectopic      Multiple  0   Live Births  3           { Click here to update OB Charting then refresh note  :1}    Current Medications: Current Meds  Medication Sig   acetaminophen (TYLENOL) 325 MG tablet Take 2 tablets (650 mg total) by mouth every 6 (six) hours as needed (for pain scale < 4).   aspirin EC 81 MG tablet Take 1 tablet (81 mg total) by mouth daily. Take after 12 weeks for prevention of preeclampsia later in pregnancy   Blood Pressure Monitoring (BLOOD PRESSURE KIT) DEVI 1 kit by Does not apply route as needed.   Blood Pressure Monitoring (BLOOD PRESSURE KIT) DEVI 1 kit by Does not apply route as  needed.   enoxaparin (LOVENOX) 150 MG/ML injection Inject 1 mL (150 mg total) into the skin daily.   enoxaparin (LOVENOX) 150 MG/ML injection Inject 1 mL (150 mg total) into the skin daily.   omeprazole (PRILOSEC) 40 MG capsule Take 40 mg by mouth daily.   Prenatal MV & Min w/FA-DHA (PRENATAL GUMMIES) 0.18-25 MG CHEW Chew by mouth.     Allergies:   Patient has no known allergies.   Social History   Socioeconomic History   Marital status: Married    Spouse name: Not on file   Number of children: 1   Years of education: college   Highest education level: Not on file  Occupational History    Employer: OTHER    Comment: [employer]  Tobacco Use   Smoking status: Former   Smokeless tobacco: Never  Scientific laboratory technician Use: Never used  Substance and Sexual Activity   Alcohol use: Yes    Comment: Occasionally, not since confirmed pregnancy   Drug use: No   Sexual activity:  Yes    Partners: Male    Birth control/protection: None  Other Topics Concern   Not on file  Social History Narrative   Patient lives at home with family.   Caffeine Use: 2 sodas daily   Social Determinants of Health   Financial Resource Strain: Not on file  Food Insecurity: Not on file  Transportation Needs: Not on file  Physical Activity: Not on file  Stress: Not on file  Social Connections: Not on file  { Click here to update SDOH then refresh :1}    Family History  Problem Relation Age of Onset   Hypertension Mother    Heart disease Maternal Grandmother    Heart disease Maternal Grandfather    Diabetes Paternal Grandmother    Cancer Paternal Uncle    Diabetes Paternal Uncle    { Click here to update FH then refresh note    :1}   ROS:   Please see the history of present illness.     All other systems reviewed and are negative.   Labs/EKG Reviewed:    EKG:   EKG is *** ordered today.  The ekg ordered today demonstrates ***  Recent Labs: 10/15/2021: ALT 7; BUN 8; Creatinine,  Ser 0.59; Hemoglobin 11.6; Platelets 291; Potassium 3.9; Sodium 134; TSH 1.830   Recent Lipid Panel Lab Results  Component Value Date/Time   CHOL 142 05/13/2014 08:20 AM   TRIG 77 05/13/2014 08:20 AM   HDL 55 05/13/2014 08:20 AM   CHOLHDL 2.6 05/13/2014 08:20 AM   LDLCALC 72 05/13/2014 08:20 AM    Physical Exam:    VS:  BP 118/78   Pulse (!) 102   Ht 5' 9"  (1.753 m)   Wt (!) 311 lb 3.2 oz (141.2 kg)   LMP 06/04/2021   SpO2 98%   BMI 45.96 kg/m     Wt Readings from Last 3 Encounters:  11/29/21 (!) 311 lb 3.2 oz (141.2 kg)  11/14/21 (!) 311 lb (141.1 kg)  10/15/21 (!) 304 lb (137.9 kg)     GEN: *** Well nourished, well developed in no acute distress HEENT: Normal NECK: No JVD; No carotid bruits LYMPHATICS: No lymphadenopathy CARDIAC: ***RRR, no murmurs, rubs, gallops RESPIRATORY:  Clear to auscultation without rales, wheezing or rhonchi  ABDOMEN: Soft, non-tender, non-distended MUSCULOSKELETAL:  No edema; No deformity  SKIN: Warm and dry NEUROLOGIC:  Alert and oriented x 3 PSYCHIATRIC:  Normal affect    Risk Assessment/Risk Calculators:   { Click to calculate CARPREG II - THEN refresh note :1}    { Click to caclulate Mod WHO Class of CV Risk - THEN refresh note :1}     { Click for [id number] Score - THEN Refresh Note    :[id number]}      ASSESSMENT & PLAN:    *** There are no Patient Instructions on file for this visit.   Dispo:  No follow-ups on file.   Medication Adjustments/Labs and Tests Ordered: Current medicines are reviewed at length with the patient today.  Concerns regarding medicines are outlined above.  Tests Ordered: No orders of the defined types were placed in this encounter.  Medication Changes: No orders of the defined types were placed in this encounter.

## 2021-11-29 NOTE — Patient Instructions (Addendum)
Medication Instructions:  Your physician recommends that you continue on your current medications as directed. Please refer to the Current Medication list given to you today.  *If you need a refill on your cardiac medications before your next appointment, please call your pharmacy*   Lab Work: None If you have labs (blood work) drawn today and your tests are completely normal, you will receive your results only by: MyChart Message (if you have MyChart) OR A paper copy in the mail If you have any lab test that is abnormal or we need to change your treatment, we will call you to review the results.   Testing/Procedures: None   Follow-Up: At [facility], you and your health needs are our priority.  As part of our continuing mission to provide you with exceptional heart care, we have created designated Provider Care Teams.  These Care Teams include your primary Cardiologist (physician) and Advanced Practice Providers (APPs -  Physician Assistants and Nurse Practitioners) who all work together to provide you with the care you need, when you need it.  We recommend signing up for the patient portal called "MyChart".  Sign up information is provided on this After Visit Summary.  MyChart is used to connect with patients for Virtual Visits (Telemedicine).  Patients are able to view lab/test results, encounter notes, upcoming appointments, etc.  Non-urgent messages can be sent to your provider as well.   To learn more about what you can do with MyChart, go to [email or web address].    Your next appointment:   14-15 week(s)  The format for your next appointment:   In Person  Provider:   Thomasene Ripple, DO [address], [place], [place] [address]   Or [facility] [address] [place], [place] [address]  Other instructions  . Diabetes Mellitus and Nutrition, Adult When you have diabetes, or diabetes mellitus, it is very important to have healthy eating habits  because your blood sugar (glucose) levels are greatly affected by what you eat and drink. Eating healthy foods in the right amounts, at about the same times every day, can help you: Manage your blood glucose. Lower your risk of heart disease. Improve your blood pressure. Reach or maintain a healthy weight. What can affect my meal plan? Every person with diabetes is different, and each person has different needs for a meal plan. Your health care provider may recommend that you work with a dietitian to make a meal plan that is best for you. Your meal plan may vary depending on factors such as: The calories you need. The medicines you take. Your weight. Your blood glucose, blood pressure, and cholesterol levels. Your activity level. Other health conditions you have, such as heart or kidney disease. How do carbohydrates affect me? Carbohydrates, also called carbs, affect your blood glucose level more than any other type of food. Eating carbs raises the amount of glucose in your blood. It is important to know how many carbs you can safely have in each meal. This is different for every person. Your dietitian can help you calculate how many carbs you should have at each meal and for each snack. How does alcohol affect me? Alcohol can cause a decrease in blood glucose (hypoglycemia), especially if you use insulin or take certain diabetes medicines by mouth. Hypoglycemia can be a life-threatening condition. Symptoms of hypoglycemia, such as sleepiness, dizziness, and confusion, are similar to symptoms of having too much alcohol. Do not drink alcohol if: Your health care  provider tells you not to drink. You are pregnant, may be pregnant, or are planning to become pregnant. If you drink alcohol: Limit how much you have to: 0-1 drink a day for women. 0-2 drinks a day for men. Know how much alcohol is in your drink. In the [place] one drink equals one 12 oz bottle of beer (355 mL), one 5 oz glass of wine  (148 mL), or one 1 oz glass of hard liquor (44 mL). Keep yourself hydrated with water, diet soda, or unsweetened iced tea. Keep in mind that regular soda, juice, and other mixers may contain a lot of sugar and must be counted as carbs. What are tips for following this plan?  Reading food labels Start by checking the serving size on the Nutrition Facts label of packaged foods and drinks. The number of calories and the amount of carbs, fats, and other nutrients listed on the label are based on one serving of the item. Many items contain more than one serving per package. Check the total grams (g) of carbs in one serving. Check the number of grams of saturated fats and trans fats in one serving. Choose foods that have a low amount or none of these fats. Check the number of milligrams (mg) of salt (sodium) in one serving. Most people should limit total sodium intake to less than 2,300 mg per day. Always check the nutrition information of foods labeled as "low-fat" or "nonfat." These foods may be higher in added sugar or refined carbs and should be avoided. Talk to your dietitian to identify your daily goals for nutrients listed on the label. Shopping Avoid buying canned, pre-made, or processed foods. These foods tend to be high in fat, sodium, and added sugar. Shop around the outside edge of the grocery store. This is where you will most often find fresh fruits and vegetables, bulk grains, fresh meats, and fresh dairy products. Cooking Use low-heat cooking methods, such as baking, instead of high-heat cooking methods, such as deep frying. Cook using healthy oils, such as olive, canola, or sunflower oil. Avoid cooking with butter, cream, or high-fat meats. Meal planning Eat meals and snacks regularly, preferably at the same times every day. Avoid going long periods of time without eating. Eat foods that are high in fiber, such as fresh fruits, vegetables, beans, and whole grains. Eat 4-6 oz (112-168  g) of lean protein each day, such as lean meat, chicken, fish, eggs, or tofu. One ounce (oz) (28 g) of lean protein is equal to: 1 oz (28 g) of meat, chicken, or fish. 1 egg.  cup (62 g) of tofu. Eat some foods each day that contain healthy fats, such as avocado, nuts, seeds, and fish. What foods should I eat? Fruits Berries. Apples. Oranges. Peaches. Apricots. Plums. Grapes. Mangoes. Papayas. Pomegranates. Kiwi. Cherries. Vegetables Leafy greens, including lettuce, spinach, kale, chard, collard greens, mustard greens, and cabbage. Beets. Cauliflower. Broccoli. Carrots. Green beans. Tomatoes. Peppers. Onions. Cucumbers. Brussels sprouts. Grains Whole grains, such as whole-wheat or whole-grain bread, crackers, tortillas, cereal, and pasta. Unsweetened oatmeal. Quinoa. Brown or wild rice. Meats and other proteins Seafood. Poultry without skin. Lean cuts of poultry and beef. Tofu. Nuts. Seeds. Dairy Low-fat or fat-free dairy products such as milk, yogurt, and cheese. The items listed above may not be a complete list of foods and beverages you can eat and drink. Contact a dietitian for more information. What foods should I avoid? Fruits Fruits canned with syrup. Vegetables Canned vegetables. Frozen vegetables  with butter or cream sauce. Grains Refined white flour and flour products such as bread, pasta, snack foods, and cereals. Avoid all processed foods. Meats and other proteins Fatty cuts of meat. Poultry with skin. Breaded or fried meats. Processed meat. Avoid saturated fats. Dairy Full-fat yogurt, cheese, or milk. Beverages Sweetened drinks, such as soda or iced tea. The items listed above may not be a complete list of foods and beverages you should avoid. Contact a dietitian for more information. Questions to ask a health care provider Do I need to meet with a certified diabetes care and education specialist? Do I need to meet with a dietitian? What number can I call if I have  questions? When are the best times to check my blood glucose? Where to find more information: American Diabetes Association: diabetes.org Academy of Nutrition and Dietetics: eatright.[employer] of Diabetes and Digestive and Kidney Diseases: StageSync.si Association of Diabetes Care & Education Specialists: diabeteseducator.org Summary It is important to have healthy eating habits because your blood sugar (glucose) levels are greatly affected by what you eat and drink. It is important to use alcohol carefully. A healthy meal plan will help you manage your blood glucose and lower your risk of heart disease. Your health care provider may recommend that you work with a dietitian to make a meal plan that is best for you. This information is not intended to replace advice given to you by your health care provider. Make sure you discuss any questions you have with your health care provider. Document Revised: 12/14/2019 Document Reviewed: 12/14/2019 Elsevier Patient Education  2023 Elsevier Inc.  Important Information About Sugar

## 2021-12-13 ENCOUNTER — Ambulatory Visit (INDEPENDENT_AMBULATORY_CARE_PROVIDER_SITE_OTHER): Payer: Medicaid Other | Admitting: Obstetrics and Gynecology

## 2021-12-13 VITALS — BP 122/81 | HR 109 | Wt 313.4 lb

## 2021-12-13 DIAGNOSIS — O99210 Obesity complicating pregnancy, unspecified trimester: Secondary | ICD-10-CM

## 2021-12-13 DIAGNOSIS — Z8632 Personal history of gestational diabetes: Secondary | ICD-10-CM

## 2021-12-13 DIAGNOSIS — O09299 Supervision of pregnancy with other poor reproductive or obstetric history, unspecified trimester: Secondary | ICD-10-CM

## 2021-12-13 DIAGNOSIS — Z8673 Personal history of transient ischemic attack (TIA), and cerebral infarction without residual deficits: Secondary | ICD-10-CM | POA: Diagnosis not present

## 2021-12-13 DIAGNOSIS — Z6841 Body Mass Index (BMI) 40.0 and over, adult: Secondary | ICD-10-CM

## 2021-12-13 DIAGNOSIS — O0990 Supervision of high risk pregnancy, unspecified, unspecified trimester: Secondary | ICD-10-CM

## 2021-12-13 DIAGNOSIS — O99119 Other diseases of the blood and blood-forming organs and certain disorders involving the immune mechanism complicating pregnancy, unspecified trimester: Secondary | ICD-10-CM | POA: Diagnosis not present

## 2021-12-13 DIAGNOSIS — D6859 Other primary thrombophilia: Secondary | ICD-10-CM | POA: Diagnosis not present

## 2021-12-13 DIAGNOSIS — Z3A24 24 weeks gestation of pregnancy: Secondary | ICD-10-CM

## 2021-12-13 MED ORDER — ENOXAPARIN SODIUM 150 MG/ML IJ SOSY: 150.0000 mg | PREFILLED_SYRINGE | Freq: Two times a day (BID) | INTRAMUSCULAR | 6 refills | Status: DC

## 2021-12-13 NOTE — Progress Notes (Signed)
HROB [redacted]w[redacted]d. Patient has no questions or concerns at this time.

## 2021-12-13 NOTE — Progress Notes (Signed)
PRENATAL VISIT NOTE  Subjective:  Angel [name] is a 31 y.o. [phone] at [redacted]w[redacted]d being seen today for ongoing prenatal care.  She is currently monitored for the following issues for this high-risk pregnancy and has Migraine; Morbid obesity (HCC); red chart,History of ischemic stroke x 2 in 2015; Cerebral infarction (HCC); Anemia; Protein S deficiency (HCC); Protein S deficiency affecting pregnancy, antepartum (HCC); History of pre-eclampsia in prior pregnancy, currently pregnant; Maternal morbid obesity, antepartum (HCC); H/O insulin controlled gestational diabetes in prior pregnancy, currently pregnant; Supervision of high risk pregnancy, antepartum; Prediabetes; BMI 45.0-49.9, adult (HCC); Basilic vein thrombosis; Vitamin D deficiency; Panic attacks; Anxiety state; and Frequent headaches on their problem list.  Patient doing well with no acute concerns today. She reports no complaints.  Contractions: Not present. Vag. Bleeding: None.  Movement: Present. Denies leaking of fluid.   The following portions of the patient's history were reviewed and updated as appropriate: allergies, current medications, past family history, past medical history, past social history, past surgical history and problem list. Problem list updated.  Objective:   Vitals:   12/13/21 1123  BP: 122/81  Pulse: (!) 109  Weight: (!) 313 lb 6.4 oz (142.2 kg)    Fetal Status: Fetal Heart Rate (bpm): 154 Fundal Height: 24 cm Movement: Present     General:  Alert, oriented and cooperative. Patient is in no acute distress.  Skin: Skin is warm and dry. No rash noted.   Cardiovascular: Normal heart rate noted  Respiratory: Normal respiratory effort, no problems with respiration noted  Abdomen: Soft, gravid, appropriate for gestational age.  Pain/Pressure: Absent     Pelvic: Cervical exam deferred        Extremities: Normal range of motion.  Edema: None  Mental Status:  Normal mood and affect. Normal behavior. Normal  judgment and thought content.   Assessment and Plan:  Pregnancy: G4P3003 at [redacted]w[redacted]d  1. [redacted] weeks gestation of pregnancy   2. red chart,History of ischemic stroke x 2 in 2015 Pt continues with lovenox 150 mg BID Factor Xa level drawn today roughly 4 hours after administration - Heparin Anti-Xa - enoxaparin (LOVENOX) 150 MG/ML injection; Inject 1 mL (150 mg total) into the skin every 12 (twelve) hours.  Dispense: 30 mL; Refill: 6  3. Protein S deficiency affecting pregnancy, antepartum (HCC)  - Heparin Anti-Xa - enoxaparin (LOVENOX) 150 MG/ML injection; Inject 1 mL (150 mg total) into the skin every 12 (twelve) hours.  Dispense: 30 mL; Refill: 6  4. Maternal morbid obesity, antepartum (HCC)   5. BMI 45.0-49.9, adult (HCC)   6. Supervision of high risk pregnancy, antepartum Continue routine prenatal care - enoxaparin (LOVENOX) 150 MG/ML injection; Inject 1 mL (150 mg total) into the skin every 12 (twelve) hours.  Dispense: 30 mL; Refill: 6  7. H/O insulin controlled gestational diabetes in prior pregnancy, currently pregnant 2 hour GTT at next visit  8. History of ischemic stroke Pt has been seen by OB cards, appreciate their assistance  - Heparin Anti-Xa - enoxaparin (LOVENOX) 150 MG/ML injection; Inject 1 mL (150 mg total) into the skin every 12 (twelve) hours.  Dispense: 30 mL; Refill: 6  Preterm labor symptoms and general obstetric precautions including but not limited to vaginal bleeding, contractions, leaking of fluid and fetal movement were reviewed in detail with the patient.  Please refer to After Visit Summary for other counseling recommendations.   Return in about 4 weeks (around 01/10/2022) for 2 hr GTT, 3rd trim labs.  Lynnda Shields, MD Faculty Attending [facility]

## 2021-12-15 LAB — HEPARIN ANTI-XA: Heparin Anti-Xa: 0.94 IU/mL

## 2021-12-20 ENCOUNTER — Ambulatory Visit: Payer: Medicaid Other | Admitting: Cardiology

## 2021-12-25 ENCOUNTER — Ambulatory Visit: Payer: Medicaid Other | Admitting: *Deleted

## 2021-12-25 ENCOUNTER — Other Ambulatory Visit: Payer: Self-pay | Admitting: *Deleted

## 2021-12-25 ENCOUNTER — Ambulatory Visit: Payer: Medicaid Other | Attending: Maternal & Fetal Medicine

## 2021-12-25 VITALS — BP 132/80 | HR 91

## 2021-12-25 DIAGNOSIS — O280 Abnormal hematological finding on antenatal screening of mother: Secondary | ICD-10-CM | POA: Insufficient documentation

## 2021-12-25 DIAGNOSIS — O99891 Other specified diseases and conditions complicating pregnancy: Secondary | ICD-10-CM | POA: Diagnosis not present

## 2021-12-25 DIAGNOSIS — O99213 Obesity complicating pregnancy, third trimester: Secondary | ICD-10-CM

## 2021-12-25 DIAGNOSIS — O99212 Obesity complicating pregnancy, second trimester: Secondary | ICD-10-CM | POA: Diagnosis not present

## 2021-12-25 DIAGNOSIS — O09293 Supervision of pregnancy with other poor reproductive or obstetric history, third trimester: Secondary | ICD-10-CM | POA: Diagnosis not present

## 2021-12-25 DIAGNOSIS — R638 Other symptoms and signs concerning food and fluid intake: Secondary | ICD-10-CM

## 2021-12-25 DIAGNOSIS — E669 Obesity, unspecified: Secondary | ICD-10-CM | POA: Diagnosis not present

## 2021-12-25 DIAGNOSIS — Z8673 Personal history of transient ischemic attack (TIA), and cerebral infarction without residual deficits: Secondary | ICD-10-CM | POA: Insufficient documentation

## 2021-12-25 DIAGNOSIS — O0990 Supervision of high risk pregnancy, unspecified, unspecified trimester: Secondary | ICD-10-CM | POA: Diagnosis not present

## 2021-12-25 DIAGNOSIS — Z3A35 35 weeks gestation of pregnancy: Secondary | ICD-10-CM | POA: Diagnosis not present

## 2022-01-07 ENCOUNTER — Telehealth: Payer: Self-pay | Admitting: *Deleted

## 2022-01-07 ENCOUNTER — Encounter: Payer: Self-pay | Admitting: Obstetrics and Gynecology

## 2022-01-07 NOTE — Telephone Encounter (Signed)
TC to f/u with pt on elevated BP reading in Babyscripts and report of HA and SOB. Pt reports BP 130/91, HA improved with rest and tylenol, not SOB. Consulted with Dr. Donavan Foil. Plans to have pt repeat BP and cont to monitor. Pt advised. Pt verbalized understanding. Advised to seek care immediately HA unrelieved by Tylenol, visual changes, dizziness, RUQ pain.

## 2022-01-07 NOTE — Telephone Encounter (Signed)
Elevated BP's  X 2 further readings Babyscripts 154/87. Advised pt of Dr. Laverna Peace recommendation to seek care in [facility] if she has another SBP of 150 or above. Pt verbalized understanding.

## 2022-01-08 ENCOUNTER — Encounter: Payer: Self-pay | Admitting: Obstetrics and Gynecology

## 2022-01-08 ENCOUNTER — Other Ambulatory Visit: Payer: Medicaid Other

## 2022-01-08 ENCOUNTER — Ambulatory Visit (INDEPENDENT_AMBULATORY_CARE_PROVIDER_SITE_OTHER): Payer: Medicaid Other | Admitting: Obstetrics and Gynecology

## 2022-01-08 VITALS — BP 121/79 | HR 98

## 2022-01-08 DIAGNOSIS — O99119 Other diseases of the blood and blood-forming organs and certain disorders involving the immune mechanism complicating pregnancy, unspecified trimester: Secondary | ICD-10-CM

## 2022-01-08 DIAGNOSIS — O0992 Supervision of high risk pregnancy, unspecified, second trimester: Secondary | ICD-10-CM

## 2022-01-08 DIAGNOSIS — O09299 Supervision of pregnancy with other poor reproductive or obstetric history, unspecified trimester: Secondary | ICD-10-CM

## 2022-01-08 DIAGNOSIS — Z8673 Personal history of transient ischemic attack (TIA), and cerebral infarction without residual deficits: Secondary | ICD-10-CM

## 2022-01-08 DIAGNOSIS — Z3A27 27 weeks gestation of pregnancy: Secondary | ICD-10-CM

## 2022-01-08 DIAGNOSIS — Z8632 Personal history of gestational diabetes: Secondary | ICD-10-CM

## 2022-01-08 DIAGNOSIS — O09292 Supervision of pregnancy with other poor reproductive or obstetric history, second trimester: Secondary | ICD-10-CM

## 2022-01-08 DIAGNOSIS — D6859 Other primary thrombophilia: Secondary | ICD-10-CM

## 2022-01-08 DIAGNOSIS — O0990 Supervision of high risk pregnancy, unspecified, unspecified trimester: Secondary | ICD-10-CM | POA: Diagnosis not present

## 2022-01-08 NOTE — Patient Instructions (Signed)

## 2022-01-08 NOTE — Progress Notes (Signed)
Subjective:  Angel [name] is a 31 y.o. [phone] at [redacted]w[redacted]d being seen today for ongoing prenatal care.  She is currently monitored for the following issues for this high-risk pregnancy and has Migraine; Morbid obesity (HCC); red chart,History of ischemic stroke x 2 in 2015; Cerebral infarction (HCC); Anemia; Protein S deficiency (HCC); Protein S deficiency affecting pregnancy, antepartum (HCC); History of pre-eclampsia in prior pregnancy, currently pregnant; Maternal morbid obesity, antepartum (HCC); H/O insulin controlled gestational diabetes in prior pregnancy, currently pregnant; Supervision of high risk pregnancy, antepartum; Prediabetes; BMI 45.0-49.9, adult (HCC); Vitamin D deficiency; Panic attacks; Anxiety state; and Frequent headaches on their problem list.  Patient reports general discomforts of pregnancy.  Contractions: Not present. Vag. Bleeding: None.  Movement: Present. Denies leaking of fluid.   The following portions of the patient's history were reviewed and updated as appropriate: allergies, current medications, past family history, past medical history, past social history, past surgical history and problem list. Problem list updated.  Objective:   Vitals:   01/08/22 0847  BP: 121/79  Pulse: 98    Fetal Status: Fetal Heart Rate (bpm): 150   Movement: Present     General:  Alert, oriented and cooperative. Patient is in no acute distress.  Skin: Skin is warm and dry. No rash noted.   Cardiovascular: Normal heart rate noted  Respiratory: Normal respiratory effort, no problems with respiration noted  Abdomen: Soft, gravid, appropriate for gestational age. Pain/Pressure: Absent     Pelvic:  Cervical exam deferred        Extremities: Normal range of motion.     Mental Status: Normal mood and affect. Normal behavior. Normal judgment and thought content.   Urinalysis:      Assessment and Plan:  Pregnancy: G4P3003 at [redacted]w[redacted]d  1. Supervision of high risk pregnancy,  antepartum Stable - Glucose Tolerance, 2 Hours w/1 Hour - CBC - HIV Antibody (routine testing w rflx) - RPR  2. Protein S deficiency affecting pregnancy, antepartum (HCC) Stable  Continue with Lovenox Check Anti Xa levels monthly Serial growth scans and antenatal testing per MF - Heparin Anti-Xa; Future  3. History of pre-eclampsia in prior pregnancy, currently pregnant BP Stable No S/Sx of PEC at present  4. H/O insulin controlled gestational diabetes in prior pregnancy, currently pregnant Glucola today  5. red chart,History of ischemic stroke x 2 in 2015 See above - Heparin Anti-Xa; Future  Preterm labor symptoms and general obstetric precautions including but not limited to vaginal bleeding, contractions, leaking of fluid and fetal movement were reviewed in detail with the patient. Please refer to After Visit Summary for other counseling recommendations.  Return in about 2 weeks (around 01/22/2022) for OB visit, face to face, MD only.   Hermina Staggers, MD

## 2022-01-09 ENCOUNTER — Other Ambulatory Visit: Payer: Self-pay | Admitting: *Deleted

## 2022-01-09 ENCOUNTER — Encounter: Payer: Self-pay | Admitting: Obstetrics and Gynecology

## 2022-01-09 DIAGNOSIS — O24410 Gestational diabetes mellitus in pregnancy, diet controlled: Secondary | ICD-10-CM

## 2022-01-09 LAB — CBC
Hematocrit: 32.2 % — ABNORMAL LOW (ref 34.0–46.6)
Hemoglobin: 10.3 g/dL — ABNORMAL LOW (ref 11.1–15.9)
MCH: 25.8 pg — ABNORMAL LOW (ref 26.6–33.0)
MCHC: 32 g/dL (ref 31.5–35.7)
MCV: 81 fL (ref 79–97)
Platelets: 350 10*3/uL (ref 150–450)
RBC: 4 x10E6/uL (ref 3.77–5.28)
RDW: 13.9 % (ref 11.7–15.4)
WBC: 10.1 10*3/uL (ref 3.4–10.8)

## 2022-01-09 LAB — GLUCOSE TOLERANCE, 2 HOURS W/ 1HR
Glucose, 1 hour: 219 mg/dL — ABNORMAL HIGH (ref 70–179)
Glucose, 2 hour: 121 mg/dL (ref 70–152)
Glucose, Fasting: 138 mg/dL — ABNORMAL HIGH (ref 70–91)

## 2022-01-09 LAB — HIV ANTIBODY (ROUTINE TESTING W REFLEX): HIV Screen 4th Generation wRfx: NONREACTIVE

## 2022-01-09 LAB — RPR: RPR Ser Ql: NONREACTIVE

## 2022-01-09 MED ORDER — ACCU-CHEK GUIDE W/DEVICE KIT: 1.0000 | PACK | Freq: Four times a day (QID) | 0 refills | Status: DC

## 2022-01-09 MED ORDER — ACCU-CHEK SOFTCLIX LANCETS MISC: 1.0000 | Freq: Four times a day (QID) | 12 refills | Status: DC

## 2022-01-09 MED ORDER — ACCU-CHEK GUIDE VI STRP: ORAL_STRIP | 12 refills | Status: DC

## 2022-01-09 NOTE — Progress Notes (Signed)
TC to pt. No answer. No VM available. Pt has been communicating via MyChart. MyChart message including info on referral to DM educator, meter and supplies, and GDM education sent.

## 2022-01-12 ENCOUNTER — Encounter: Payer: Self-pay | Admitting: Family Medicine

## 2022-01-13 ENCOUNTER — Encounter: Payer: Self-pay | Admitting: Obstetrics and Gynecology

## 2022-01-14 ENCOUNTER — Telehealth: Payer: Self-pay | Admitting: Emergency Medicine

## 2022-01-14 NOTE — Telephone Encounter (Signed)
TC to patient.  Patient instructed to take blood pressure on phone.  Pt unable to, states she is dropping off her children at daycare. This RN asked patient to send MyChart message with BP reading and any accompanying symptoms. Pt agreed.

## 2022-01-15 ENCOUNTER — Ambulatory Visit: Payer: Medicaid Other | Admitting: Registered"

## 2022-01-22 ENCOUNTER — Other Ambulatory Visit: Payer: Medicaid Other

## 2022-01-22 ENCOUNTER — Encounter: Payer: Self-pay | Admitting: Obstetrics and Gynecology

## 2022-01-22 ENCOUNTER — Ambulatory Visit: Payer: Medicaid Other | Attending: Obstetrics

## 2022-01-22 ENCOUNTER — Ambulatory Visit: Payer: Medicaid Other | Admitting: *Deleted

## 2022-01-22 ENCOUNTER — Ambulatory Visit (HOSPITAL_BASED_OUTPATIENT_CLINIC_OR_DEPARTMENT_OTHER): Payer: Medicaid Other | Admitting: Obstetrics

## 2022-01-22 ENCOUNTER — Other Ambulatory Visit: Payer: Self-pay | Admitting: *Deleted

## 2022-01-22 ENCOUNTER — Ambulatory Visit (INDEPENDENT_AMBULATORY_CARE_PROVIDER_SITE_OTHER): Payer: Medicaid Other | Admitting: Obstetrics and Gynecology

## 2022-01-22 VITALS — BP 132/78 | HR 93

## 2022-01-22 VITALS — BP 135/82 | HR 128 | Wt 322.0 lb

## 2022-01-22 DIAGNOSIS — E669 Obesity, unspecified: Secondary | ICD-10-CM

## 2022-01-22 DIAGNOSIS — O09293 Supervision of pregnancy with other poor reproductive or obstetric history, third trimester: Secondary | ICD-10-CM

## 2022-01-22 DIAGNOSIS — O24410 Gestational diabetes mellitus in pregnancy, diet controlled: Secondary | ICD-10-CM

## 2022-01-22 DIAGNOSIS — Z8673 Personal history of transient ischemic attack (TIA), and cerebral infarction without residual deficits: Secondary | ICD-10-CM | POA: Insufficient documentation

## 2022-01-22 DIAGNOSIS — O0993 Supervision of high risk pregnancy, unspecified, third trimester: Secondary | ICD-10-CM

## 2022-01-22 DIAGNOSIS — Z3A29 29 weeks gestation of pregnancy: Secondary | ICD-10-CM | POA: Diagnosis not present

## 2022-01-22 DIAGNOSIS — O99113 Other diseases of the blood and blood-forming organs and certain disorders involving the immune mechanism complicating pregnancy, third trimester: Secondary | ICD-10-CM | POA: Diagnosis not present

## 2022-01-22 DIAGNOSIS — O99213 Obesity complicating pregnancy, third trimester: Secondary | ICD-10-CM | POA: Diagnosis not present

## 2022-01-22 DIAGNOSIS — O0990 Supervision of high risk pregnancy, unspecified, unspecified trimester: Secondary | ICD-10-CM | POA: Insufficient documentation

## 2022-01-22 DIAGNOSIS — O99212 Obesity complicating pregnancy, second trimester: Secondary | ICD-10-CM | POA: Diagnosis not present

## 2022-01-22 DIAGNOSIS — O24419 Gestational diabetes mellitus in pregnancy, unspecified control: Secondary | ICD-10-CM | POA: Insufficient documentation

## 2022-01-22 DIAGNOSIS — O99119 Other diseases of the blood and blood-forming organs and certain disorders involving the immune mechanism complicating pregnancy, unspecified trimester: Secondary | ICD-10-CM | POA: Diagnosis not present

## 2022-01-22 DIAGNOSIS — R638 Other symptoms and signs concerning food and fluid intake: Secondary | ICD-10-CM | POA: Diagnosis not present

## 2022-01-22 DIAGNOSIS — O99891 Other specified diseases and conditions complicating pregnancy: Secondary | ICD-10-CM

## 2022-01-22 DIAGNOSIS — D6859 Other primary thrombophilia: Secondary | ICD-10-CM

## 2022-01-22 DIAGNOSIS — O09299 Supervision of pregnancy with other poor reproductive or obstetric history, unspecified trimester: Secondary | ICD-10-CM

## 2022-01-22 NOTE — Progress Notes (Signed)
ROB 29.[redacted] wks GA  Saw MFM today Antifactor X-A today drawn today For GDM teaching next week (No show 01/15/22)

## 2022-01-22 NOTE — Progress Notes (Signed)
MFM Note  Angel [name] was seen for a follow up exam due to a history of ischemic strokes and maternal obesity with a BMI of 45.  She is currently treated with therapeutic Lovenox.  She is scheduled to have her antifactor X-A level measured again later this morning.  She was recently diagnosed with gestational diabetes.  She is scheduled for diabetic teaching next week.  She was informed that the fetal growth and amniotic fluid level appears appropriate for her gestational age.  The following were discussed during our consultation today:  Gestational diabetes and pregnancy  The implications and management of diabetes in pregnancy was discussed in detail with the patient.    She was advised to monitor her fingersticks 4 times daily (fasting and 2 hours after each meal).    She was advised that our goals for her fingerstick values are fasting values of 90-95 or less and two-hour postprandial values of 120 or less.    Should the majority of her fingerstick results be above these values, she may have to be started on insulin or metformin to help her achieve better glycemic control.   The patient was advised that getting her fingerstick values as close to these goals as possible would provide her with the most optimal obstetrical outcome.  The increased risk of polyhydramnios, fetal macrosomia, and preeclampsia associated with diabetes was also discussed.    Due to gestational diabetes, we will continue to follow her with monthly growth ultrasounds.    The patient was advised that delivery for well-controlled diabetes in pregnancy is usually recommended at around 39 weeks.    Delivery at 37 weeks may be considered should her glycemic control be poor.  Due to maternal obesity and her underlying medical conditions, we will start weekly BPP's at 32 weeks.  She will return for BPP in 3 weeks.  The patient stated that all of her questions have been answered to her satisfaction.     A total of 20 minutes was spent counseling and coordinating the care for this patient.  Greater than 50% of the time was spent in direct face-to-face contact.

## 2022-01-22 NOTE — Progress Notes (Signed)
Subjective:  Angel [name] is a 31 y.o. [phone] at [redacted]w[redacted]d being seen today for ongoing prenatal care.  She is currently monitored for the following issues for this high-risk pregnancy and has Migraine; Morbid obesity (HCC); red chart,History of ischemic stroke x 2 in 2015; Cerebral infarction (HCC); Anemia; Protein S deficiency (HCC); Protein S deficiency affecting pregnancy, antepartum (HCC); History of pre-eclampsia in prior pregnancy, currently pregnant; Maternal morbid obesity, antepartum (HCC); Supervision of high risk pregnancy, antepartum; Prediabetes; BMI 45.0-49.9, adult (HCC); Vitamin D deficiency; Panic attacks; Anxiety state; Frequent headaches; and Gestational diabetes on their problem list.  Patient reports general discomforts of pregnancy.  Contractions: Not present. Vag. Bleeding: None.  Movement: Present. Denies leaking of fluid.   The following portions of the patient's history were reviewed and updated as appropriate: allergies, current medications, past family history, past medical history, past social history, past surgical history and problem list. Problem list updated.  Objective:   Vitals:   01/22/22 1121  BP: 135/82  Pulse: (!) 128  Weight: (!) 322 lb (146.1 kg)    Fetal Status: Fetal Heart Rate (bpm): 157   Movement: Present     General:  Alert, oriented and cooperative. Patient is in no acute distress.  Skin: Skin is warm and dry. No rash noted.   Cardiovascular: Normal heart rate noted  Respiratory: Normal respiratory effort, no problems with respiration noted  Abdomen: Soft, gravid, appropriate for gestational age. Pain/Pressure: Absent     Pelvic:  Cervical exam deferred        Extremities: Normal range of motion.  Edema: None  Mental Status: Normal mood and affect. Normal behavior. Normal judgment and thought content.   Urinalysis:      Assessment and Plan:  Pregnancy: G4P3003 at [redacted]w[redacted]d  1. Supervision of high risk pregnancy,  antepartum Stable TDAP information provided  2. red chart,History of ischemic stroke x 2 in 2015 Stable  3. Protein S deficiency affecting pregnancy, antepartum (HCC) Stable Continue with Lovenox. AntiXa levels today Serial growth scans and antenatal testing as per MFM  4. History of pre-eclampsia in prior pregnancy, currently pregnant BP Stable No S/Sx at present  5. Diet controlled gestational diabetes mellitus (GDM) in third trimester Reviewed with pt CBG goals discussed Risk reduction with pregnancy with good glycemic control reviewed To see DM education tomorrow  Preterm labor symptoms and general obstetric precautions including but not limited to vaginal bleeding, contractions, leaking of fluid and fetal movement were reviewed in detail with the patient. Please refer to After Visit Summary for other counseling recommendations.  Return in about 2 weeks (around 02/05/2022) for OB visit, face to face, MD only.   Hermina Staggers, MD

## 2022-01-22 NOTE — Patient Instructions (Signed)

## 2022-01-23 LAB — HEPARIN ANTI-XA: Heparin Anti-Xa: 0.99 IU/mL

## 2022-01-28 ENCOUNTER — Other Ambulatory Visit: Payer: Self-pay

## 2022-01-28 ENCOUNTER — Ambulatory Visit (INDEPENDENT_AMBULATORY_CARE_PROVIDER_SITE_OTHER): Payer: Medicaid Other | Admitting: Registered"

## 2022-01-28 ENCOUNTER — Encounter: Payer: Medicaid Other | Attending: Family Medicine | Admitting: Registered"

## 2022-01-28 DIAGNOSIS — O24410 Gestational diabetes mellitus in pregnancy, diet controlled: Secondary | ICD-10-CM | POA: Diagnosis not present

## 2022-01-28 DIAGNOSIS — Z3A00 Weeks of gestation of pregnancy not specified: Secondary | ICD-10-CM | POA: Insufficient documentation

## 2022-01-28 NOTE — Progress Notes (Signed)
Patient was seen for Gestational Diabetes self-management on 01/28/2022  Start time 0815 and End time 0914   Estimated due date: 04/03/22; [redacted]w[redacted]d  This patient is accompanied in the office by her spouse.  Clinical: Medications: reviewed Medical History: Insulin controlled GDM in prior pregnancy, Vit D deficiency, anemia, ischemic stroke 2015, prediabetes, pre-clampsia in prior pregnancy Labs: OGTT 138(H)-219(H)-121, A1c 6.2%   Dietary and Lifestyle History: Pt states she doesn't drink milk because it bothers stomach, but is able to eat yogurt. Pt reports she eats yoplait daily either at breakfast or lunch. Pt states she does not snack much. Pt states she eats breakfast because she knows she should. Pt states she likes a limited variety of vegetables and eats beans when part of chili.  Pt states she stopped drinking sweetened beverages when she was told about the diagnosis. Pt states she drinks orange juice on the weekends with her kids.  Pt states she used insulin pens to control diabetes in pregnancy before and is interested in using an insulin pump if insulin is required again.  Pt states she doesn't get good sleep, often wakes up 2-3 am and is not able to go back to sleep.   Physical Activity: ADL Stress: not assessed Sleep: interrupted sleep  24 hr Recall:  First Meal: (9-10:30) bagel, fruit Snack: none Second meal: (12:30-1) hotdogs and beans with kids OR burger OR pizza Snack: none Third meal: jumbalia with sausage, peppers, rice  Snack: none Beverages: water, juice   NUTRITION INTERVENTION  Nutrition education (E-1) on the following topics:   Initial Follow-up  [x]  []  Definition of Gestational Diabetes [x]  []  Why dietary management is important in controlling blood glucose [x]  []  Effects each nutrient has on blood glucose levels []  []  Simple carbohydrates vs complex carbohydrates []  []  Fluid intake [x]  []  Creating a balanced meal plan [x]  []  Carbohydrate counting   [x]  []  When to check blood glucose levels [x]  []  Proper blood glucose monitoring techniques [x]  []  Effect of stress and stress reduction techniques  [x]  []  Exercise effect on blood glucose levels, appropriate exercise during pregnancy [x]  []  Importance of limiting caffeine and abstaining from alcohol and smoking [x]  []  Medications used for blood sugar control during pregnancy [x]  []  Hypoglycemia and rule of 15 [x]  []  Postpartum self care  Patient has a meter prior to visit. Patient is testing pre breakfast and 2 hours after each meal. FBS: 100-110 mg/dL Postprandial: testing 2 hrs after completing meal, 120-130 mg/dL  Patient instructed to monitor glucose levels: FBS: 60 - ? 95 mg/dL 2 hour: ? mg/dL  Patient received handouts: Nutrition Diabetes and Pregnancy Carbohydrate Counting List Sleep hygiene A1c chart  Patient will be seen for follow-up as needed.

## 2022-02-04 ENCOUNTER — Encounter: Payer: Self-pay | Admitting: Obstetrics and Gynecology

## 2022-02-04 ENCOUNTER — Ambulatory Visit (INDEPENDENT_AMBULATORY_CARE_PROVIDER_SITE_OTHER): Payer: Medicaid Other | Admitting: Obstetrics and Gynecology

## 2022-02-04 ENCOUNTER — Encounter: Payer: Medicaid Other | Admitting: Obstetrics & Gynecology

## 2022-02-04 VITALS — BP 124/82 | HR 109 | Wt 321.0 lb

## 2022-02-04 DIAGNOSIS — O99119 Other diseases of the blood and blood-forming organs and certain disorders involving the immune mechanism complicating pregnancy, unspecified trimester: Secondary | ICD-10-CM

## 2022-02-04 DIAGNOSIS — Z3A31 31 weeks gestation of pregnancy: Secondary | ICD-10-CM

## 2022-02-04 DIAGNOSIS — O0990 Supervision of high risk pregnancy, unspecified, unspecified trimester: Secondary | ICD-10-CM

## 2022-02-04 DIAGNOSIS — O24410 Gestational diabetes mellitus in pregnancy, diet controlled: Secondary | ICD-10-CM

## 2022-02-04 DIAGNOSIS — O0993 Supervision of high risk pregnancy, unspecified, third trimester: Secondary | ICD-10-CM

## 2022-02-04 DIAGNOSIS — O09299 Supervision of pregnancy with other poor reproductive or obstetric history, unspecified trimester: Secondary | ICD-10-CM

## 2022-02-04 DIAGNOSIS — Z8673 Personal history of transient ischemic attack (TIA), and cerebral infarction without residual deficits: Secondary | ICD-10-CM

## 2022-02-04 DIAGNOSIS — O99213 Obesity complicating pregnancy, third trimester: Secondary | ICD-10-CM

## 2022-02-04 DIAGNOSIS — O09293 Supervision of pregnancy with other poor reproductive or obstetric history, third trimester: Secondary | ICD-10-CM

## 2022-02-04 DIAGNOSIS — D6859 Other primary thrombophilia: Secondary | ICD-10-CM

## 2022-02-04 MED ORDER — HUMULIN N KWIKPEN 100 UNIT/ML ~~LOC~~ SUPN: PEN_INJECTOR | SUBCUTANEOUS | 3 refills | Status: DC

## 2022-02-04 MED ORDER — INSULIN ASPART 100 UNIT/ML FLEXPEN: PEN_INJECTOR | SUBCUTANEOUS | 11 refills | Status: DC

## 2022-02-04 NOTE — Progress Notes (Signed)
Pt states her sugars have been running higher than normal.

## 2022-02-04 NOTE — Progress Notes (Signed)
PRENATAL VISIT NOTE  Subjective:  Angel [name] is a 31 y.o. [phone] at [redacted]w[redacted]d being seen today for ongoing prenatal care.  She is currently monitored for the following issues for this high-risk pregnancy and has Migraine; Morbid obesity (HCC); red chart,History of ischemic stroke x 2 in 2015; Cerebral infarction (HCC); Anemia; Protein S deficiency (HCC); Protein S deficiency affecting pregnancy, antepartum (HCC); History of pre-eclampsia in prior pregnancy, currently pregnant; Maternal morbid obesity, antepartum (HCC); Supervision of high risk pregnancy, antepartum; Prediabetes; BMI 45.0-49.9, adult (HCC); Vitamin D deficiency; Panic attacks; Anxiety state; Frequent headaches; and Gestational diabetes on their problem list.  Patient reports no complaints.  Contractions: Not present. Vag. Bleeding: None.  Movement: Absent. Denies leaking of fluid.   The following portions of the patient's history were reviewed and updated as appropriate: allergies, current medications, past family history, past medical history, past social history, past surgical history and problem list.   Objective:   Vitals:   02/04/22 0815  BP: 124/82  Pulse: (!) 109  Weight: (!) 321 lb (145.6 kg)    Fetal Status: Fetal Heart Rate (bpm): 150 Fundal Height: 33 cm Movement: Absent     General:  Alert, oriented and cooperative. Patient is in no acute distress.  Skin: Skin is warm and dry. No rash noted.   Cardiovascular: Normal heart rate noted  Respiratory: Normal respiratory effort, no problems with respiration noted  Abdomen: Soft, gravid, appropriate for gestational age.  Pain/Pressure: Absent     Pelvic: Cervical exam deferred        Extremities: Normal range of motion.  Edema: None  Mental Status: Normal mood and affect. Normal behavior. Normal judgment and thought content.   Assessment and Plan:  Pregnancy: G4P3003 at [redacted]w[redacted]d 1. Supervision of high risk pregnancy, antepartum Patient is doing well  without complaints PLans vasectomy for contraception  2. Diet controlled gestational diabetes mellitus (GDM) in third trimester Seen by DM educator on 9/5 Elevated CBGs all day despite adjustment to diet. Patient states she has taken insulin in previous pregnancies and is ready to start again Rx insulin e-prescribed  3. red chart,History of ischemic stroke x 2 in 2015 Continue lovenox   4. Protein S deficiency affecting pregnancy, antepartum (HCC) Continue lovenox Follow up growth ultrasound  5. Maternal morbid obesity, antepartum (HCC) Continue ASA  6. History of pre-eclampsia in prior pregnancy, currently pregnant Asymptomatic and stable  Preterm labor symptoms and general obstetric precautions including but not limited to vaginal bleeding, contractions, leaking of fluid and fetal movement were reviewed in detail with the patient. Please refer to After Visit Summary for other counseling recommendations.   Return in about 2 weeks (around 02/18/2022) for in person, ROB, High risk.  Future Appointments  Date Time Provider Department Center  02/12/2022 10:30 AM [facility] NURSE [facility]  02/12/2022 10:45 AM [facility] US4 [facility]  02/17/2022  8:35 AM Teiana Hajduk, MD [facility] None  02/19/2022 10:30 AM [facility] NURSE [facility]  02/19/2022 10:45 AM [facility] US6 [facility]  02/26/2022 10:30 AM [facility] NURSE [facility]  02/26/2022 10:45 AM [facility] US5 [facility]  03/04/2022  8:35 AM Anyanwu, Jethro Bastos, MD [facility] None  03/07/2022  1:20 PM Meriam Sprague, MD [facility] None  03/11/2022  8:55 AM Bianco Cange, Gigi Gin, MD [facility] None  03/18/2022  8:35 AM Adam Phenix, MD [facility] None  03/26/2022  8:35 AM Warden Fillers, MD [facility] None  04/01/2022  8:35 AM Hermina Staggers, MD [facility] None  Mora Bellman, MD

## 2022-02-06 ENCOUNTER — Encounter: Payer: Self-pay | Admitting: Obstetrics and Gynecology

## 2022-02-06 ENCOUNTER — Other Ambulatory Visit: Payer: Self-pay | Admitting: *Deleted

## 2022-02-06 ENCOUNTER — Other Ambulatory Visit: Payer: Self-pay | Admitting: Emergency Medicine

## 2022-02-06 DIAGNOSIS — O24414 Gestational diabetes mellitus in pregnancy, insulin controlled: Secondary | ICD-10-CM

## 2022-02-06 MED ORDER — "INSULIN SYRINGE-NEEDLE U-100 27G X 1/2"" 0.5 ML MISC": 1.0000 | Freq: Two times a day (BID) | 1 refills | Status: DC

## 2022-02-06 MED ORDER — INSULIN NPH (HUMAN) (ISOPHANE) 100 UNIT/ML ~~LOC~~ SUSP: SUBCUTANEOUS | 3 refills | Status: DC

## 2022-02-06 NOTE — Progress Notes (Signed)
TC to pt to discuss prior authorization needs for Insulin Aspart FlexPen and Humulin N KwikPen. Advised insurance will cover name brand only for Insulin Aspart and vial only/ not KwikPen for Humulin. Advised RX will be converted to name brand for Insulin Aspart. Shared decision making for Humulin N. Pt states she will be OK with trying the Humulin N vial and drawing up doses and administering with needle and syringe. Order will be converted to Humulin N vial. Dr. Jolayne Panther was consulted and is in agreement. TC to [facility]. Insulin Aspart FlexPen to be run as brand name. RX Humulin N vial and RX insulin syringes sent.

## 2022-02-12 ENCOUNTER — Other Ambulatory Visit: Payer: Self-pay | Admitting: *Deleted

## 2022-02-12 ENCOUNTER — Ambulatory Visit: Payer: Medicaid Other | Admitting: *Deleted

## 2022-02-12 ENCOUNTER — Encounter: Payer: Self-pay | Admitting: *Deleted

## 2022-02-12 ENCOUNTER — Ambulatory Visit: Payer: Medicaid Other | Attending: Obstetrics

## 2022-02-12 VITALS — BP 136/72 | HR 94

## 2022-02-12 DIAGNOSIS — O09293 Supervision of pregnancy with other poor reproductive or obstetric history, third trimester: Secondary | ICD-10-CM | POA: Diagnosis not present

## 2022-02-12 DIAGNOSIS — D6859 Other primary thrombophilia: Secondary | ICD-10-CM

## 2022-02-12 DIAGNOSIS — Z6841 Body Mass Index (BMI) 40.0 and over, adult: Secondary | ICD-10-CM

## 2022-02-12 DIAGNOSIS — Z3A32 32 weeks gestation of pregnancy: Secondary | ICD-10-CM

## 2022-02-12 DIAGNOSIS — E669 Obesity, unspecified: Secondary | ICD-10-CM | POA: Diagnosis not present

## 2022-02-12 DIAGNOSIS — Z8673 Personal history of transient ischemic attack (TIA), and cerebral infarction without residual deficits: Secondary | ICD-10-CM | POA: Insufficient documentation

## 2022-02-12 DIAGNOSIS — O24414 Gestational diabetes mellitus in pregnancy, insulin controlled: Secondary | ICD-10-CM

## 2022-02-12 DIAGNOSIS — O0990 Supervision of high risk pregnancy, unspecified, unspecified trimester: Secondary | ICD-10-CM | POA: Diagnosis not present

## 2022-02-12 DIAGNOSIS — Z8632 Personal history of gestational diabetes: Secondary | ICD-10-CM | POA: Insufficient documentation

## 2022-02-12 DIAGNOSIS — O99213 Obesity complicating pregnancy, third trimester: Secondary | ICD-10-CM

## 2022-02-12 DIAGNOSIS — O09299 Supervision of pregnancy with other poor reproductive or obstetric history, unspecified trimester: Secondary | ICD-10-CM | POA: Diagnosis not present

## 2022-02-17 ENCOUNTER — Encounter: Payer: Self-pay | Admitting: Obstetrics and Gynecology

## 2022-02-17 ENCOUNTER — Ambulatory Visit (INDEPENDENT_AMBULATORY_CARE_PROVIDER_SITE_OTHER): Payer: Medicaid Other | Admitting: Obstetrics and Gynecology

## 2022-02-17 VITALS — BP 111/73 | HR 120 | Wt 326.0 lb

## 2022-02-17 DIAGNOSIS — Z3A33 33 weeks gestation of pregnancy: Secondary | ICD-10-CM

## 2022-02-17 DIAGNOSIS — O0990 Supervision of high risk pregnancy, unspecified, unspecified trimester: Secondary | ICD-10-CM

## 2022-02-17 DIAGNOSIS — O24410 Gestational diabetes mellitus in pregnancy, diet controlled: Secondary | ICD-10-CM

## 2022-02-17 DIAGNOSIS — D6859 Other primary thrombophilia: Secondary | ICD-10-CM

## 2022-02-17 DIAGNOSIS — O0993 Supervision of high risk pregnancy, unspecified, third trimester: Secondary | ICD-10-CM

## 2022-02-17 DIAGNOSIS — O09293 Supervision of pregnancy with other poor reproductive or obstetric history, third trimester: Secondary | ICD-10-CM

## 2022-02-17 DIAGNOSIS — O99119 Other diseases of the blood and blood-forming organs and certain disorders involving the immune mechanism complicating pregnancy, unspecified trimester: Secondary | ICD-10-CM

## 2022-02-17 DIAGNOSIS — Z8673 Personal history of transient ischemic attack (TIA), and cerebral infarction without residual deficits: Secondary | ICD-10-CM

## 2022-02-17 DIAGNOSIS — O99213 Obesity complicating pregnancy, third trimester: Secondary | ICD-10-CM

## 2022-02-17 DIAGNOSIS — O09299 Supervision of pregnancy with other poor reproductive or obstetric history, unspecified trimester: Secondary | ICD-10-CM

## 2022-02-17 NOTE — Progress Notes (Signed)
PRENATAL VISIT NOTE  Subjective:  Angel [name] is a 31 y.o. [phone] at [redacted]w[redacted]d being seen today for ongoing prenatal care.  She is currently monitored for the following issues for this high-risk pregnancy and has Migraine; Morbid obesity ([place]); red chart,History of ischemic stroke x 2 in 2015; Cerebral infarction ([place]); Anemia; Protein S deficiency ([place]); Protein S deficiency affecting pregnancy, antepartum ([place]); History of pre-eclampsia in prior pregnancy, currently pregnant; Maternal morbid obesity, antepartum ([place]); Supervision of high risk pregnancy, antepartum; Prediabetes; BMI 45.0-49.9, adult ([place]); Vitamin D deficiency; Panic attacks; Anxiety state; Frequent headaches; and Gestational diabetes on their problem list.  Patient reports backache.  Contractions: Not present. Vag. Bleeding: None.  Movement: Present. Denies leaking of fluid.   The following portions of the patient's history were reviewed and updated as appropriate: allergies, current medications, past family history, past medical history, past social history, past surgical history and problem list.   Objective:   Vitals:   02/17/22 0825  BP: 111/73  Pulse: (!) 120  Weight: (!) 326 lb (147.9 kg)    Fetal Status: Fetal Heart Rate (bpm): 145 Fundal Height: 35 cm Movement: Present     General:  Alert, oriented and cooperative. Patient is in no acute distress.  Skin: Skin is warm and dry. No rash noted.   Cardiovascular: Normal heart rate noted  Respiratory: Normal respiratory effort, no problems with respiration noted  Abdomen: Soft, gravid, appropriate for gestational age.  Pain/Pressure: Absent     Pelvic: Cervical exam deferred        Extremities: Normal range of motion.     Mental Status: Normal mood and affect. Normal behavior. Normal judgment and thought content.   Assessment and Plan:  Pregnancy: [id number] at [redacted]w[redacted]d 1. Supervision of high risk pregnancy, antepartum Patient is doing well Encourage back  exercises  2. Diet controlled gestational diabetes mellitus (GDM) in third trimester CBGs reviewed and continue to remain elevated Long acting insulin increased by 2 units in AM and PM- 16 and 8 units respectively Encouraged patient to increase physical activity by incorporating a daily walk Follow up ultrasound on 9/27  3. red chart,History of ischemic stroke x 2 in 2015 Continue lovenox  4. Maternal morbid obesity, antepartum ([facility]) Continue ASA  5. History of pre-eclampsia in prior pregnancy, currently pregnant Normotensive and asymptomatic  6. Protein S deficiency affecting pregnancy, antepartum ([place])   Preterm labor symptoms and general obstetric precautions including but not limited to vaginal bleeding, contractions, leaking of fluid and fetal movement were reviewed in detail with the patient. Please refer to After Visit Summary for other counseling recommendations.   Return in about 1 week (around 02/24/2022) for in person, ROB, High risk.  Future Appointments  Date Time Provider [place]  02/19/2022 10:30 AM [facility] NURSE [facility]  02/19/2022 10:45 AM [facility] US6 [facility]  02/26/2022 10:30 AM [facility] NURSE [facility]  02/26/2022 10:45 AM [facility] US5 [facility]  03/04/2022  8:35 AM Anyanwu, Sallyanne Havers, MD [facility] None  03/05/2022  1:30 PM [facility] NURSE [facility]  03/05/2022  1:45 PM [facility] US4 [facility]  03/07/2022  1:20 PM Johney Frame, Greer Ee, MD [facility] None  03/11/2022  8:55 AM Arina Torry, MD [facility] None  03/12/2022  7:45 AM [facility] NURSE [facility]  03/12/2022  8:00 AM [facility] US1 [facility]  03/18/2022  8:35 AM Truett Mainland, DO [facility] None  03/19/2022  7:45 AM [facility] NURSE [facility]  03/19/2022  8:00 AM [facility] US1 [facility]  [facility]  03/26/2022  8:35 AM Griffin Basil, MD [facility] None  04/01/2022  8:35 AM Chancy Milroy, MD [facility] None    Mora Bellman, MD

## 2022-02-19 ENCOUNTER — Ambulatory Visit: Payer: Medicaid Other | Attending: Obstetrics

## 2022-02-19 ENCOUNTER — Ambulatory Visit: Payer: Medicaid Other | Admitting: *Deleted

## 2022-02-19 VITALS — BP 135/68 | HR 78

## 2022-02-19 DIAGNOSIS — O24414 Gestational diabetes mellitus in pregnancy, insulin controlled: Secondary | ICD-10-CM

## 2022-02-19 DIAGNOSIS — Z3A33 33 weeks gestation of pregnancy: Secondary | ICD-10-CM | POA: Diagnosis not present

## 2022-02-19 DIAGNOSIS — O0990 Supervision of high risk pregnancy, unspecified, unspecified trimester: Secondary | ICD-10-CM | POA: Diagnosis not present

## 2022-02-19 DIAGNOSIS — Z8673 Personal history of transient ischemic attack (TIA), and cerebral infarction without residual deficits: Secondary | ICD-10-CM | POA: Insufficient documentation

## 2022-02-19 DIAGNOSIS — O09299 Supervision of pregnancy with other poor reproductive or obstetric history, unspecified trimester: Secondary | ICD-10-CM | POA: Insufficient documentation

## 2022-02-19 DIAGNOSIS — O3660X0 Maternal care for excessive fetal growth, unspecified trimester, not applicable or unspecified: Secondary | ICD-10-CM | POA: Diagnosis not present

## 2022-02-19 DIAGNOSIS — E669 Obesity, unspecified: Secondary | ICD-10-CM | POA: Diagnosis not present

## 2022-02-19 DIAGNOSIS — O99213 Obesity complicating pregnancy, third trimester: Secondary | ICD-10-CM | POA: Insufficient documentation

## 2022-02-19 DIAGNOSIS — Z8632 Personal history of gestational diabetes: Secondary | ICD-10-CM | POA: Diagnosis not present

## 2022-02-20 ENCOUNTER — Encounter: Payer: Self-pay | Admitting: Obstetrics and Gynecology

## 2022-02-23 ENCOUNTER — Other Ambulatory Visit (HOSPITAL_COMMUNITY)
Admission: RE | Admit: 2022-02-23 | Discharge: 2022-02-23 | Disposition: A | Discharge status: Home / Self Care | Payer: Medicaid Other | Source: Ambulatory Visit | Attending: Obstetrics & Gynecology | Admitting: Obstetrics & Gynecology

## 2022-02-23 DIAGNOSIS — Z3A36 36 weeks gestation of pregnancy: Secondary | ICD-10-CM | POA: Insufficient documentation

## 2022-02-23 DIAGNOSIS — O0990 Supervision of high risk pregnancy, unspecified, unspecified trimester: Secondary | ICD-10-CM | POA: Insufficient documentation

## 2022-02-26 ENCOUNTER — Ambulatory Visit: Payer: Medicaid Other | Attending: Obstetrics

## 2022-02-26 ENCOUNTER — Encounter: Payer: Self-pay | Admitting: Maternal & Fetal Medicine

## 2022-02-26 ENCOUNTER — Encounter: Payer: Self-pay | Admitting: Obstetrics and Gynecology

## 2022-02-26 ENCOUNTER — Ambulatory Visit: Payer: Medicaid Other | Admitting: *Deleted

## 2022-02-26 VITALS — BP 114/62 | HR 124

## 2022-02-26 DIAGNOSIS — O24414 Gestational diabetes mellitus in pregnancy, insulin controlled: Secondary | ICD-10-CM

## 2022-02-26 DIAGNOSIS — Z3A34 34 weeks gestation of pregnancy: Secondary | ICD-10-CM

## 2022-02-26 DIAGNOSIS — E669 Obesity, unspecified: Secondary | ICD-10-CM

## 2022-02-26 DIAGNOSIS — O09293 Supervision of pregnancy with other poor reproductive or obstetric history, third trimester: Secondary | ICD-10-CM

## 2022-02-26 DIAGNOSIS — O3663X0 Maternal care for excessive fetal growth, third trimester, not applicable or unspecified: Secondary | ICD-10-CM | POA: Diagnosis not present

## 2022-02-26 DIAGNOSIS — O99213 Obesity complicating pregnancy, third trimester: Secondary | ICD-10-CM | POA: Diagnosis not present

## 2022-02-26 DIAGNOSIS — Z8673 Personal history of transient ischemic attack (TIA), and cerebral infarction without residual deficits: Secondary | ICD-10-CM | POA: Insufficient documentation

## 2022-02-26 DIAGNOSIS — O0990 Supervision of high risk pregnancy, unspecified, unspecified trimester: Secondary | ICD-10-CM

## 2022-02-26 DIAGNOSIS — O09299 Supervision of pregnancy with other poor reproductive or obstetric history, unspecified trimester: Secondary | ICD-10-CM | POA: Insufficient documentation

## 2022-02-26 DIAGNOSIS — Z8632 Personal history of gestational diabetes: Secondary | ICD-10-CM | POA: Diagnosis not present

## 2022-02-27 ENCOUNTER — Encounter: Payer: Self-pay | Admitting: Obstetrics and Gynecology

## 2022-02-27 ENCOUNTER — Other Ambulatory Visit: Payer: Self-pay | Admitting: Obstetrics & Gynecology

## 2022-02-27 DIAGNOSIS — O24419 Gestational diabetes mellitus in pregnancy, unspecified control: Secondary | ICD-10-CM

## 2022-02-27 NOTE — Progress Notes (Signed)
Patient was discussed at [place] with MFM, the recommendation if for IOL at 37 weeks due to macrosomia with GDM. Patient will be informed of this plan. IOL orders signed and held.   Verita Schneiders, MD

## 2022-03-03 ENCOUNTER — Encounter: Payer: Self-pay | Admitting: Obstetrics and Gynecology

## 2022-03-04 ENCOUNTER — Ambulatory Visit (INDEPENDENT_AMBULATORY_CARE_PROVIDER_SITE_OTHER): Payer: Medicaid Other | Admitting: Obstetrics & Gynecology

## 2022-03-04 ENCOUNTER — Telehealth: Payer: Self-pay | Admitting: *Deleted

## 2022-03-04 ENCOUNTER — Encounter: Payer: Self-pay | Admitting: Obstetrics & Gynecology

## 2022-03-04 VITALS — BP 127/81 | HR 114 | Wt 327.8 lb

## 2022-03-04 DIAGNOSIS — O24414 Gestational diabetes mellitus in pregnancy, insulin controlled: Secondary | ICD-10-CM

## 2022-03-04 DIAGNOSIS — O0990 Supervision of high risk pregnancy, unspecified, unspecified trimester: Secondary | ICD-10-CM

## 2022-03-04 DIAGNOSIS — O3663X0 Maternal care for excessive fetal growth, third trimester, not applicable or unspecified: Secondary | ICD-10-CM

## 2022-03-04 DIAGNOSIS — O99213 Obesity complicating pregnancy, third trimester: Secondary | ICD-10-CM

## 2022-03-04 DIAGNOSIS — O99119 Other diseases of the blood and blood-forming organs and certain disorders involving the immune mechanism complicating pregnancy, unspecified trimester: Secondary | ICD-10-CM

## 2022-03-04 DIAGNOSIS — O0993 Supervision of high risk pregnancy, unspecified, third trimester: Secondary | ICD-10-CM

## 2022-03-04 DIAGNOSIS — D6859 Other primary thrombophilia: Secondary | ICD-10-CM

## 2022-03-04 DIAGNOSIS — Z3A36 36 weeks gestation of pregnancy: Secondary | ICD-10-CM

## 2022-03-04 DIAGNOSIS — Z8673 Personal history of transient ischemic attack (TIA), and cerebral infarction without residual deficits: Secondary | ICD-10-CM

## 2022-03-04 MED ORDER — INSULIN NPH (HUMAN) (ISOPHANE) 100 UNIT/ML ~~LOC~~ SUSP: SUBCUTANEOUS | 3 refills | Status: DC

## 2022-03-04 MED ORDER — INSULIN ASPART 100 UNIT/ML FLEXPEN: PEN_INJECTOR | SUBCUTANEOUS | 11 refills | Status: DC

## 2022-03-04 NOTE — Telephone Encounter (Signed)
TC to follow up on need for work accommodations form. Husband answered. LM for pt to call the office.

## 2022-03-04 NOTE — Progress Notes (Signed)
PRENATAL VISIT NOTE  Subjective:  Angel [name] is a 31 y.o. [phone] at [redacted]w[redacted]d being seen today for ongoing prenatal care.  She is currently monitored for the following issues for this high-risk pregnancy and has Migraine; Morbid obesity ([place]); red chart,History of ischemic stroke x 2 in 2015; Cerebral infarction ([place]); Anemia; Protein S deficiency ([place]); Protein S deficiency affecting pregnancy, antepartum ([place]); History of pre-eclampsia in prior pregnancy, currently pregnant; Maternal morbid obesity, antepartum ([place]); Supervision of high risk pregnancy, antepartum; Prediabetes; BMI 45.0-49.9, adult ([place]); Vitamin D deficiency; Panic attacks; Anxiety state; Frequent headaches; and Gestational diabetes on their problem list.  Patient reports no complaints.  Contractions: Not present. Vag. Bleeding: None.  Movement: Present. Denies leaking of fluid.   The following portions of the patient's history were reviewed and updated as appropriate: allergies, current medications, past family history, past medical history, past social history, past surgical history and problem list.   Objective:   Vitals:   03/04/22 0830  BP: 127/81  Pulse: (!) 114  Weight: (!) 327 lb 12.8 oz (148.7 kg)    Fetal Status: Fetal Heart Rate (bpm): 147   Movement: Present     General:  Alert, oriented and cooperative. Patient is in no acute distress.  Skin: Skin is warm and dry. No rash noted.   Cardiovascular: Normal heart rate noted  Respiratory: Normal respiratory effort, no problems with respiration noted  Abdomen: Soft, gravid, appropriate for gestational age.  Pain/Pressure: Absent     Pelvic: Cervical exam performed in the presence of a chaperone Dilation: Closed Effacement (%): Thick Station: Ballotable  Extremities: Normal range of motion.  Edema: None  Mental Status: Normal mood and affect. Normal behavior. Normal judgment and thought content.   [place] MFM FETAL BPP WO NON STRESS  Result Date:  02/26/2022 ----------------------------------------------------------------------  OBSTETRICS REPORT                       (Signed Final 02/26/2022 11:21 am) ---------------------------------------------------------------------- Patient Info  ID #:       [id number]                          D.O.B.:  06-29-1990 (31 yrs)  Name:       [name]                  Visit Date: 02/26/2022 11:09 am              [name] ---------------------------------------------------------------------- Performed By  Attending:        Johnell Comings MD         Ref. Address:     1st floor                                                             [address]                                                             [place], [place]  [id number]  Performed By:     Benson Norway          [facility]                    RD[facility]                                     [facility] at                                                             [place] for                                                             Women  Referred By:      Woodroe Mode                    MD ---------------------------------------------------------------------- Orders  #  Description                           Code        Ordered By  1  [place] MFM FETAL BPP WO NON               76819.01    YU FANG     STRESS ----------------------------------------------------------------------  #  Order #                     Accession #                Episode #  1  [id number]                   [id number]                 [id number] ---------------------------------------------------------------------- Indications  Gestational diabetes in pregnancy, insulin     O24.414  controlled  Large for gestational age fetus affecting      O55.60X0  management of mother  Obesity complicating pregnancy, second         O99.212  trimester (BMI 107)  Medical complication of pregnancy              O26.90  (Ischemic stroke (x2) on  Lovenox), Protein S  deficiency)  Poor obstetric history: Previous gestational   O09.299  diabetes (A1c)  LR NIPS  [redacted] weeks gestation of pregnancy                Z3A.34 ---------------------------------------------------------------------- Vital Signs  BP:          114/62 ---------------------------------------------------------------------- Fetal Evaluation  Num Of Fetuses:         1  Fetal Heart Rate(bpm):  132  Cardiac Activity:       Observed  Presentation:           Cephalic  Placenta:               Posterior  P. Cord Insertion:      Previously Visualized  Amniotic Fluid  AFI FV:      Within normal limits  AFI Sum(cm)     %Tile       Largest Pocket(cm)  17.4            64          6.3  RUQ(cm)       RLQ(cm)       LUQ(cm)        LLQ(cm)  6.3           5.9           2.9            2.3 ---------------------------------------------------------------------- Biophysical Evaluation  Amniotic F.V:   Pocket => 2 cm             F. Tone:        Observed  F. Movement:    Observed                   Score:          8/8  F. Breathing:   Observed ---------------------------------------------------------------------- OB History  Gravidity:    4         Term:   3  Living:       3 ---------------------------------------------------------------------- Gestational Age  LMP:           38w 1d        Date:  06/04/21                  EDD:   03/11/22  Best:          34w 6d     Det. By:  U/S C R L  (08/27/21)    EDD:   04/03/22 ---------------------------------------------------------------------- Comments  This patient was seen for a BPP due to maternal obesity with  a BMI of 45.  Her pregnancy has also been complicated by  gestational diabetes that is treated with insulin.  She denies  any problems since her last exam.  A biophysical profile performed today was 8/8.  The AFI was 17.4 cm (within normal limits).  She will return in 1 week for another BPP. ----------------------------------------------------------------------                    Johnell Comings, MD Electronically Signed Final Report   02/26/2022 11:21 am ----------------------------------------------------------------------  [place] MFM FETAL BPP WO NON STRESS  Result Date: 02/19/2022 ----------------------------------------------------------------------  OBSTETRICS REPORT                       (Signed Final 02/19/2022 11:46 am) ---------------------------------------------------------------------- Patient Info  ID #:       [id number]                          D.O.B.:  [birth year] (31 yrs)  Name:       [facility] Angel                  Visit Date: 02/19/2022 11:25 am              [name] ---------------------------------------------------------------------- Performed By  Attending:        Sander Nephew      Ref. Address:     1st floor                    MD  [place], [place]  Performed By:     Jeanene Erb BS,      Location[facility]                    RDMS                                     Fetal Care at                                                             [place] for                                                             Women  Referred By:      Woodroe Mode                    MD ---------------------------------------------------------------------- Orders  #  Description                           Code        Ordered By  1  [place] MFM FETAL BPP WO NON               76819.01    YU FANG     STRESS  2  [place] MFM OB FOLLOW UP                   [id number]    YU FANG ----------------------------------------------------------------------  #  Order #                     Accession #                Episode #  1  [id number]                   [id number]                 [id number]  2  [id number]                   [id number]                 [id number]  ----------------------------------------------------------------------  Indications  Gestational diabetes in pregnancy, insulin     O24.414  controlled  Large for gestational age fetus affecting      O72.60X0  management of mother  Obesity complicating pregnancy, second         O99.212  trimester (BMI 27)  Medical complication of pregnancy              O26.90  (Ischemic stroke (x2) on Lovenox), Protein S  deficiency)  Poor obstetric history: Previous gestational   O09.299  diabetes (A1c)  LR NIPS  Encounter for other antenatal screening        Z36.2  follow-up  [redacted] weeks gestation of pregnancy                Z3A.33 ---------------------------------------------------------------------- Vital Signs  BP:          135/68 ---------------------------------------------------------------------- Fetal Evaluation  Num Of Fetuses:         1  Fetal Heart Rate(bpm):  164  Cardiac Activity:       Observed  Presentation:           Cephalic  Placenta:               Posterior  P. Cord Insertion:      Previously Visualized  Amniotic Fluid  AFI FV:      Within normal limits  AFI Sum(cm)     %Tile       Largest Pocket(cm)  14.83           53          6.58  RUQ(cm)       RLQ(cm)       LUQ(cm)        LLQ(cm)  1.97          2.12          4.16           6.58 ---------------------------------------------------------------------- Biophysical Evaluation  Amniotic F.V:   Pocket => 2 cm             F. Tone:        Observed  F. Movement:    Observed                   Score:          8/8  F. Breathing:   Observed ---------------------------------------------------------------------- Biometry  BPD:     89.95  mm     G. Age:  36w 3d         97  %    CI:        75.13   %    70 - 86                                                          FL/HC:      21.9   %    19.4 - 21.8  HC:    329.19   mm     G. Age:  37w 3d         93  %    HC/AC:      1.02        0.96 - 1.11  AC:    324.13   mm     G. Age:  33w 2d  98  %    FL/BPD:     80.0   %    71 - 87   FL:      71.96  mm     G. Age:  36w 6d         97  %    FL/AC:      22.2   %    20 - 24  Est. FW:    2980  gm      6 lb 9 oz     98  % ---------------------------------------------------------------------- OB History  Gravidity:    4         Term:   3  Living:       3 ---------------------------------------------------------------------- Gestational Age  LMP:           37w 1d        Date:  06/04/21                  EDD:   03/11/22  U/S Today:     36w 5d                                        EDD:   03/14/22  Best:          33w 6d     Det. By:  U/S C R L  (08/27/21)    EDD:   04/03/22 ---------------------------------------------------------------------- Anatomy  Cranium:               Appears normal         LVOT:                   Previously seen  Cavum:                 Appears normal         Aortic Arch:            Previously seen  Ventricles:            Appears normal         Ductal Arch:            Previously seen  Choroid Plexus:        Previously seen        Diaphragm:              Appears normal  Cerebellum:            Previously seen        Stomach:                Appears normal, left                                                                        sided  Posterior Fossa:       Previously seen        Abdomen:                Previously seen  Nuchal Fold:           Not applicable (Q000111Q    Abdominal Wall:  Previously seen                         wks GA)  Face:                  Orbits and profile     Cord Vessels:           Previously seen                         previously seen  Lips:                  Appears normal         Kidneys:                Appear normal  Palate:                Not well visualized    Bladder:                Appears normal  Thoracic:              Appears normal         Spine:                  Previously seen  Heart:                 Appears normal         Upper Extremities:      Previously seen                         (4CH, axis, and                         situs)  RVOT:                   Previously seen        Lower Extremities:      Previously seen  Other:  Female gender previously seen. Heels/feet and open hands/5th digits          previously visualized. ---------------------------------------------------------------------- Targeted Anatomy  Head/Neck  Nasal Bone:            Previously seen  Thorax  3 Vessel View:         Previously seen        IVC:                    Previously seen  3 V Trachea View:      Previously seen ---------------------------------------------------------------------- Cervix Uterus Adnexa  Cervix  Not visualized (advanced GA >24wks) ---------------------------------------------------------------------- Impression  Follow up growth due to A2GDM and elevated BMI  Normal interval growth with measurements consistent with  large for gestational age.  Good fetal movement and amniotic fluid volume  Biophysical profile is 8/8  Ms. Roeder was recently seen on 9/25 with an increase in  insulin. ---------------------------------------------------------------------- Recommendations  Continue weekly testing repeat growth in 4 weeks. ----------------------------------------------------------------------              Sander Nephew, MD Electronically Signed Final Report   02/19/2022 11:46 am ----------------------------------------------------------------------  [place] MFM OB FOLLOW UP  Result Date: 02/19/2022 ----------------------------------------------------------------------  OBSTETRICS REPORT                       (Signed Final 02/19/2022 11:46 am) ---------------------------------------------------------------------- Patient  Info  ID #:       [id number]                          D.O.B.:  19-Aug-1990 (31 yrs)  Name:       [facility] Angel                  Visit Date: 02/19/2022 11:25 am              [name] ---------------------------------------------------------------------- Performed By  Attending:        Sander Nephew      Ref. Address:     1st floor                    MD                                                              [address]                                                             [place], [place]  Performed By:     Jeanene Erb BS,      Location[facility]                    RD[facility]                                     Fetal [facility] at                                                             [place] for                                                             Women  Referred By:      Woodroe Mode                    MD ---------------------------------------------------------------------- Orders  #  Description                           Code  Ordered By  1  [place] MFM FETAL BPP WO NON               76819.01    YU FANG     STRESS  2  [place] MFM OB FOLLOW UP                   [id number]    YU FANG ----------------------------------------------------------------------  #  Order #                     Accession #                Episode #  1  [id number]                   [id number]                 [id number]  2  [id number]                   [id number]                 [id number] ---------------------------------------------------------------------- Indications  Gestational diabetes in pregnancy, insulin     O24.414  controlled  Large for gestational age fetus affecting      O1.60X0  management of mother  Obesity complicating pregnancy, second         O99.212  trimester (BMI 36)  Medical complication of pregnancy              O26.90  (Ischemic stroke (x2) on Lovenox), Protein S  deficiency)  Poor obstetric history: Previous gestational   O09.299  diabetes (A1c)  LR NIPS  Encounter for other antenatal screening        Z36.2  follow-up  [redacted] weeks gestation of pregnancy                Z3A.33 ---------------------------------------------------------------------- Vital Signs  BP:          135/68 ---------------------------------------------------------------------- Fetal Evaluation  Num Of Fetuses:          1  Fetal Heart Rate(bpm):  164  Cardiac Activity:       Observed  Presentation:           Cephalic  Placenta:               Posterior  P. Cord Insertion:      Previously Visualized  Amniotic Fluid  AFI FV:      Within normal limits  AFI Sum(cm)     %Tile       Largest Pocket(cm)  14.83           53          6.58  RUQ(cm)       RLQ(cm)       LUQ(cm)        LLQ(cm)  1.97          2.12          4.16           6.58 ---------------------------------------------------------------------- Biophysical Evaluation  Amniotic F.V:   Pocket => 2 cm             F. Tone:        Observed  F. Movement:    Observed                   Score:          8/8  F. Breathing:   Observed ----------------------------------------------------------------------  Biometry  BPD:     89.95  mm     G. Age:  36w 3d         97  %    CI:        75.13   %    70 - 86                                                          FL/HC:      21.9   %    19.4 - 21.8  HC:    329.19   mm     G. Age:  37w 3d         93  %    HC/AC:      1.02        0.96 - 1.11  AC:    324.13   mm     G. Age:  36w 2d         98  %    FL/BPD:     80.0   %    71 - 87  FL:      71.96  mm     G. Age:  36w 6d         97  %    FL/AC:      22.2   %    20 - 24  Est. FW:    2980  gm      6 lb 9 oz     98  % ---------------------------------------------------------------------- OB History  Gravidity:    4         Term:   3  Living:       3 ---------------------------------------------------------------------- Gestational Age  LMP:           37w 1d        Date:  06/04/21                  EDD:   03/11/22  U/S Today:     36w 5d                                        EDD:   03/14/22  Best:          33w 6d     Det. By:  U/S C R L  (08/27/21)    EDD:   04/03/22 ---------------------------------------------------------------------- Anatomy  Cranium:               Appears normal         LVOT:                   Previously seen  Cavum:                 Appears normal         Aortic Arch:             Previously seen  Ventricles:            Appears normal         Ductal Arch:            Previously seen  Choroid Plexus:        Previously seen  Diaphragm:              Appears normal  Cerebellum:            Previously seen        Stomach:                Appears normal, left                                                                        sided  Posterior Fossa:       Previously seen        Abdomen:                Previously seen  Nuchal Fold:           Not applicable (Q000111Q    Abdominal Wall:         Previously seen                         wks GA)  Face:                  Orbits and profile     Cord Vessels:           Previously seen                         previously seen  Lips:                  Appears normal         Kidneys:                Appear normal  Palate:                Not well visualized    Bladder:                Appears normal  Thoracic:              Appears normal         Spine:                  Previously seen  Heart:                 Appears normal         Upper Extremities:      Previously seen                         (4CH, axis, and                         situs)  RVOT:                  Previously seen        Lower Extremities:      Previously seen  Other:  Female gender previously seen. Heels/feet and open hands/5th digits          previously visualized. ---------------------------------------------------------------------- Targeted Anatomy  Head/Neck  Nasal Bone:            Previously seen  Thorax  3 Vessel View:  Previously seen        IVC:                    Previously seen  3 V Trachea View:      Previously seen ---------------------------------------------------------------------- Cervix Uterus Adnexa  Cervix  Not visualized (advanced GA >24wks) ---------------------------------------------------------------------- Impression  Follow up growth due to A2GDM and elevated BMI  Normal interval growth with measurements consistent with  large for gestational age.  Good fetal  movement and amniotic fluid volume  Biophysical profile is 8/8  Ms. Kimes was recently seen on 9/25 with an increase in  insulin. ---------------------------------------------------------------------- Recommendations  Continue weekly testing repeat growth in 4 weeks. ----------------------------------------------------------------------              Sander Nephew, MD Electronically Signed Final Report   02/19/2022 11:46 am ----------------------------------------------------------------------  [place] MFM FETAL BPP WO NON STRESS  Result Date: 02/12/2022 ----------------------------------------------------------------------  OBSTETRICS REPORT                       (Signed Final 02/12/2022 11:28 am) ---------------------------------------------------------------------- Patient Info  ID #:       [id number]                          D.O.B.:  09/15/1990 (31 yrs)  Name:       [facility] Angel                  Visit Date: 02/12/2022 10:27 am              [name] ---------------------------------------------------------------------- Performed By  Attending:        Sander Nephew      Ref. Address:     1st floor                    MD                                                             [address]                                                             [place], [place]  Performed By:     Germain Osgood            [facility]                    RD[facility]                                     Fetal  Care at                                                             [place] for                                                             Women  Referred By:      Woodroe Mode                    MD ---------------------------------------------------------------------- Orders  #  Description                           Code        Ordered By  1  [place] MFM FETAL BPP WO NON               76819.01    YU FANG     STRESS  ----------------------------------------------------------------------  #  Order #                     Accession #                Episode #  1  [id number]                   [id number]                 [id number] ---------------------------------------------------------------------- Indications  Gestational diabetes in pregnancy, insulin     O24.414  controlled  Obesity complicating pregnancy, second         O99.212  trimester (BMI 52)  Medical complication of pregnancy              O26.90  (Ischemic stroke (x2) on Lovenox), Protein S  deficiency)  Poor obstetric history: Previous gestational   O09.299  diabetes (A1c)  [redacted] weeks gestation of pregnancy                Z3A.32  LR NIPS ---------------------------------------------------------------------- Fetal Evaluation  Num Of Fetuses:         1  Fetal Heart Rate(bpm):  145  Cardiac Activity:       Observed  Presentation:           Cephalic  Placenta:               Posterior  P. Cord Insertion:      Previously Visualized  Amniotic Fluid  AFI FV:      Within normal limits  AFI Sum(cm)     %Tile       Largest Pocket(cm)  18.84           70          8.99  RUQ(cm)       RLQ(cm)       LUQ(cm)        LLQ(cm)  8.99          1.64          3.19           5.02 ----------------------------------------------------------------------  Biophysical Evaluation  Amniotic F.V:   Within normal limits       F. Tone:        Observed  F. Movement:    Observed                   Score:          8/8  F. Breathing:   Observed ---------------------------------------------------------------------- Biometry  LV:        6.6  mm ---------------------------------------------------------------------- OB History  Gravidity:    4         Term:   3  Living:       3 ---------------------------------------------------------------------- Gestational Age  LMP:           36w 1d        Date:  06/04/21                  EDD:   03/11/22  Best:          Milderd Meager 6d     Det. By:  U/S C R L  (08/27/21)    EDD:   04/03/22  ---------------------------------------------------------------------- Anatomy  Cranium:               Appears normal         Stomach:                Appears normal, left                                                                        sided  Cavum:                 Appears normal         Kidneys:                Appear normal  Ventricles:            Appears normal         Bladder:                Appears normal  Diaphragm:             Appears normal ---------------------------------------------------------------------- Cervix Uterus Adnexa  Cervix  Not visualized (advanced GA >24wks)  Uterus  No abnormality visualized.  Right Ovary  Not visualized.  Left Ovary  Not visualized.  Cul De Sac  No free fluid seen.  Adnexa  No adnexal mass visualized. ---------------------------------------------------------------------- Impression  Antenatal testing performed given maternal A2GDM, history  of ischemic stroke (on Lovenox) and elevated BMI  The biophysical profile was 8/8 with good fetal movement and  amniotic fluid volume. ---------------------------------------------------------------------- Recommendations  Continue weekly testing with serial growth as previously  scheduled. ----------------------------------------------------------------------              Sander Nephew, MD Electronically Signed Final Report   02/12/2022 11:28 am ----------------------------------------------------------------------   Assessment and Plan:  Pregnancy: [id number] at [redacted]w[redacted]d 1. Insulin controlled gestational diabetes mellitus (GDM) in third trimester 2. Macrosomia of fetus affecting management of mother in third trimester, single or unspecified fetus Did not bring log but reported fastings in 100s (108 this morning), PPs 130-140s. Advised to increase insulin dosages by 2 units across the board.  Patient has known  macrosomia, given this and inadequately controlled CBGS, MFM recommended IOL at 37 weeks already scheduled on 03/13/22.   Continue weekly antenatal testing before IOL date.  - insulin NPH Human (NOVOLIN N) 100 UNIT/ML injection; Inject 16 units total into skin daily with breakfast. Inject 8 units total into skin daily at bedtime.  Dispense: 10 mL; Refill: 3 - insulin aspart (NOVOLOG) 100 UNIT/ML FlexPen; Inject 8 units into the skin 3 (three) times daily before meals  Dispense: 15 mL; Refill: 11  3. Protein S deficiency affecting pregnancy, antepartum ([place]) 4. History of ischemic stroke x 2 in 2015 On therapeutic Lovenox, was advised to take 03/12/22 morning dose earlier and not take any further doses in preparation for IOL 03/13/22 morning.  5. Maternal morbid obesity, antepartum (HCC) TWG 67 lbs, fetal macrosomia.  6. [redacted] weeks gestation of pregnancy 7. Supervision of high risk pregnancy, antepartum Cultures done today, will follow up results and manage accordingly. - Culture, beta strep (group b only) - Cervicovaginal ancillary only  Preterm labor symptoms and general obstetric precautions including but not limited to vaginal bleeding, contractions, leaking of fluid and fetal movement were reviewed in detail with the patient. Please refer to After Visit Summary for other counseling recommendations.   Return for Postpartum check.    Verita Schneiders, MD

## 2022-03-05 ENCOUNTER — Telehealth (HOSPITAL_COMMUNITY): Payer: Self-pay | Admitting: *Deleted

## 2022-03-05 ENCOUNTER — Encounter (HOSPITAL_COMMUNITY): Payer: Self-pay | Admitting: *Deleted

## 2022-03-05 ENCOUNTER — Other Ambulatory Visit: Payer: Self-pay | Admitting: *Deleted

## 2022-03-05 ENCOUNTER — Ambulatory Visit: Payer: Medicaid Other | Attending: Maternal & Fetal Medicine

## 2022-03-05 ENCOUNTER — Ambulatory Visit: Payer: Medicaid Other | Admitting: *Deleted

## 2022-03-05 VITALS — BP 130/73 | HR 123

## 2022-03-05 DIAGNOSIS — O3660X0 Maternal care for excessive fetal growth, unspecified trimester, not applicable or unspecified: Secondary | ICD-10-CM | POA: Diagnosis not present

## 2022-03-05 DIAGNOSIS — O0990 Supervision of high risk pregnancy, unspecified, unspecified trimester: Secondary | ICD-10-CM | POA: Insufficient documentation

## 2022-03-05 DIAGNOSIS — O24414 Gestational diabetes mellitus in pregnancy, insulin controlled: Secondary | ICD-10-CM | POA: Diagnosis not present

## 2022-03-05 DIAGNOSIS — O99213 Obesity complicating pregnancy, third trimester: Secondary | ICD-10-CM | POA: Insufficient documentation

## 2022-03-05 DIAGNOSIS — O09293 Supervision of pregnancy with other poor reproductive or obstetric history, third trimester: Secondary | ICD-10-CM | POA: Diagnosis not present

## 2022-03-05 DIAGNOSIS — E669 Obesity, unspecified: Secondary | ICD-10-CM

## 2022-03-05 DIAGNOSIS — Z3A35 35 weeks gestation of pregnancy: Secondary | ICD-10-CM

## 2022-03-05 DIAGNOSIS — D6859 Other primary thrombophilia: Secondary | ICD-10-CM | POA: Diagnosis not present

## 2022-03-05 DIAGNOSIS — Z8673 Personal history of transient ischemic attack (TIA), and cerebral infarction without residual deficits: Secondary | ICD-10-CM | POA: Diagnosis not present

## 2022-03-05 DIAGNOSIS — Z6841 Body Mass Index (BMI) 40.0 and over, adult: Secondary | ICD-10-CM | POA: Diagnosis not present

## 2022-03-05 DIAGNOSIS — O99119 Other diseases of the blood and blood-forming organs and certain disorders involving the immune mechanism complicating pregnancy, unspecified trimester: Secondary | ICD-10-CM | POA: Diagnosis not present

## 2022-03-05 LAB — CERVICOVAGINAL ANCILLARY ONLY
Chlamydia: NEGATIVE
Comment: NEGATIVE
Comment: NEGATIVE
Comment: NORMAL
Neisseria Gonorrhea: NEGATIVE
Trichomonas: NEGATIVE

## 2022-03-05 NOTE — Telephone Encounter (Signed)
Preadmission screen  

## 2022-03-05 NOTE — Progress Notes (Deleted)
Cardio-Obstetrics Clinic  Follow Up Note   Date:  03/06/2022   ID:  Angel [name], DOB 1990-11-10, MRN [id number]  PCP:  Leilani Able, MD   [facility] Providers Cardiologist:  None  Electrophysiologist:  None      Referring MD: Leilani Able, MD   Chief Complaint: history of stroke  History of Present Illness:    Angel [name] is a 31 y.o. female [G4P3003] who returns for follow up of history of stroke.  Per review of the record, the patient has a history of 2 prior strokes in 2015 for which she completed a course of AC. Underwent thrombophilia work-up at that time which showed mild protein S deficiency but was otherwise normal. Has had 2 prior pregnancies that were managed with therapeutic lovenox. Notably also has a history of HLD, pre-eclampsia and gestational DM.  Was last seen by Dr. Servando Salina on 11/29/21 where she was doing well.  Today, ***   Prior CV Studies Reviewed: The following studies were reviewed today: TTE 06/11/14 Study Conclusions  - Left ventricle: The cavity size was normal. Systolic function was    normal. The estimated ejection fraction was in the range of 55%    to 60%. Wall motion was normal; there were no regional wall    motion abnormalities. The tissue Doppler parameters were normal.    Left ventricular diastolic function parameters were normal.  - Aortic valve: There was trivial regurgitation.  - Aortic root: The aortic root was normal in size.  - Mitral valve: There was mild regurgitation.  - Left atrium: The atrium was normal in size.  - Right ventricle: Systolic function was normal.  - Right atrium: The atrium was normal in size.  - Atrial septum: No defect or patent foramen ovale was identified.  - Tricuspid valve: There was mild regurgitation.  - Pulmonic valve: There was no regurgitation.  - Pulmonary arteries: Systolic pressure was within the normal    range.  - Inferior vena cava: The vessel was normal in  size.  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 Left, Per [place] 09/06/15   Gestational diabetes    H/O insulin controlled gestational diabetes in prior pregnancy, currently pregnant 09/27/2019   Headache    Hyperlipidemia    Migraine 03/13/2014   Morbid obesity (HCC) 03/13/2014   Obesity    Prediabetes    Protein S deficiency (HCC)     Past Surgical History:  Procedure Laterality Date   none     TEE WITHOUT CARDIOVERSION N/A 05/17/2014   Procedure: TRANSESOPHAGEAL ECHOCARDIOGRAM (TEE);  Surgeon: Wendall Stade, MD;  Location: [facility] ENDOSCOPY;  Service: Cardiovascular;  Laterality: N/A;   { Click here to update PMH, PSH, OB Hx then refresh note  :1}   OB History     Gravida  4   Para  3   Term  3   Preterm      AB      Living  3      SAB      IAB      Ectopic      Multiple  0   Live Births  3           { Click here to update OB  Charting then refresh note  :1}    Current Medications: No outpatient medications have been marked as taking for the 03/07/22 encounter (Appointment) with Freada Bergeron, MD.     Allergies:   Patient has no known allergies.   Social History   Socioeconomic History   Marital status: Married    Spouse name: Not on file   Number of children: 1   Years of education: college   Highest education level: Not on file  Occupational History    Employer: OTHER    Comment: [employer]  Tobacco Use   Smoking status: Former   Smokeless tobacco: Never  Scientific laboratory technician Use: Never used  Substance and Sexual Activity   Alcohol use: Yes    Comment: Occasionally, not since confirmed pregnancy   Drug use: No   Sexual activity: Yes    Partners: Male    Birth control/protection: None  Other Topics  Concern   Not on file  Social History Narrative   Patient lives at home with family.   Caffeine Use: 2 sodas daily   Social Determinants of Health   Financial Resource Strain: Not on file  Food Insecurity: Not on file  Transportation Needs: Not on file  Physical Activity: Not on file  Stress: Not on file  Social Connections: Not on file  { Click here to update SDOH then refresh :1}    Family History  Problem Relation Age of Onset   Hypertension Mother    Asthma Brother    Cancer Paternal Uncle    Diabetes Paternal Uncle    Heart disease Maternal Grandmother    Heart disease Maternal Grandfather    Diabetes Paternal Grandmother    Stroke Neg Hx    Birth defects Neg Hx    { Click here to update FH then refresh note    :1}   ROS:   Please see the history of present illness.    *** All other systems reviewed and are negative.   Labs/EKG Reviewed:    EKG:   EKG is *** ordered today.  The ekg ordered today demonstrates ***  Recent Labs: 10/15/2021: ALT 7; BUN 8; Creatinine, Ser 0.59; Potassium 3.9; Sodium 134; TSH 1.830 01/08/2022: Hemoglobin 10.3; Platelets 350   Recent Lipid Panel Lab Results  Component Value Date/Time   CHOL 142 05/13/2014 08:20 AM   TRIG 77 05/13/2014 08:20 AM   HDL 55 05/13/2014 08:20 AM   CHOLHDL 2.6 05/13/2014 08:20 AM   LDLCALC 72 05/13/2014 08:20 AM    Physical Exam:    VS:  LMP 06/04/2021     Wt Readings from Last 3 Encounters:  03/04/22 (!) 327 lb 12.8 oz (148.7 kg)  02/17/22 (!) 326 lb (147.9 kg)  02/04/22 (!) 321 lb (145.6 kg)     GEN: *** Well nourished, well developed in no acute distress HEENT: Normal NECK: No JVD; No carotid bruits LYMPHATICS: No lymphadenopathy CARDIAC: ***RRR, no murmurs, rubs, gallops RESPIRATORY:  Clear to auscultation without rales, wheezing or rhonchi  ABDOMEN: Soft, non-tender, non-distended MUSCULOSKELETAL:  No edema; No deformity  SKIN: Warm and dry NEUROLOGIC:  Alert and oriented x  3 PSYCHIATRIC:  Normal affect    Risk Assessment/Risk Calculators:   { Click to calculate CARPREG II - THEN refresh note :1}    { Click to caclulate Mod WHO Class of CV Risk - THEN refresh note :1}     { Click for [id number] Score - THEN Refresh Note    :  [id number]}      ASSESSMENT & PLAN:    #History of Stroke: Occurred in 2015 with hypercoaguable work-up revealing mild protein S deficiency. Completed course of anticoagulation at that time and has received treatment dose lovenox with her pregnancies and has done well. She is currently tolerating AC without issues.   #History of Pre-Eclampsia: Developed in *** pregnancy. Currently doing well with BP controlled. Has been maintained on low dose ASA  #Gestational DM: BG are well controlled.    There are no Patient Instructions on file for this visit.   Dispo:  No follow-ups on file.   Medication Adjustments/Labs and Tests Ordered: Current medicines are reviewed at length with the patient today.  Concerns regarding medicines are outlined above.  Tests Ordered: No orders of the defined types were placed in this encounter.  Medication Changes: No orders of the defined types were placed in this encounter.

## 2022-03-05 NOTE — Progress Notes (Addendum)
Accommodations and LOA/ FMLA forms completed and faxed to [employer] fax# [phone].  ERROR: pt has not paid $15 fee. Forms will be held until fee is paid.

## 2022-03-06 ENCOUNTER — Other Ambulatory Visit: Payer: Self-pay | Admitting: *Deleted

## 2022-03-07 ENCOUNTER — Ambulatory Visit: Payer: Medicaid Other | Admitting: Cardiology

## 2022-03-08 LAB — CULTURE, BETA STREP (GROUP B ONLY): Strep Gp B Culture: NEGATIVE

## 2022-03-09 ENCOUNTER — Other Ambulatory Visit: Payer: Self-pay | Admitting: Advanced Practice Midwife

## 2022-03-11 ENCOUNTER — Encounter: Payer: Medicaid Other | Admitting: Obstetrics and Gynecology

## 2022-03-12 ENCOUNTER — Ambulatory Visit: Payer: Medicaid Other | Attending: Maternal & Fetal Medicine

## 2022-03-12 ENCOUNTER — Ambulatory Visit: Payer: Medicaid Other | Admitting: *Deleted

## 2022-03-12 VITALS — BP 121/76 | HR 116

## 2022-03-12 DIAGNOSIS — E669 Obesity, unspecified: Secondary | ICD-10-CM | POA: Diagnosis not present

## 2022-03-12 DIAGNOSIS — O3660X0 Maternal care for excessive fetal growth, unspecified trimester, not applicable or unspecified: Secondary | ICD-10-CM

## 2022-03-12 DIAGNOSIS — O2693 Pregnancy related conditions, unspecified, third trimester: Secondary | ICD-10-CM | POA: Diagnosis not present

## 2022-03-12 DIAGNOSIS — O09293 Supervision of pregnancy with other poor reproductive or obstetric history, third trimester: Secondary | ICD-10-CM | POA: Diagnosis not present

## 2022-03-12 DIAGNOSIS — Z6841 Body Mass Index (BMI) 40.0 and over, adult: Secondary | ICD-10-CM | POA: Insufficient documentation

## 2022-03-12 DIAGNOSIS — Z3A36 36 weeks gestation of pregnancy: Secondary | ICD-10-CM | POA: Diagnosis not present

## 2022-03-12 DIAGNOSIS — O99213 Obesity complicating pregnancy, third trimester: Secondary | ICD-10-CM | POA: Diagnosis not present

## 2022-03-12 DIAGNOSIS — O24414 Gestational diabetes mellitus in pregnancy, insulin controlled: Secondary | ICD-10-CM | POA: Insufficient documentation

## 2022-03-12 DIAGNOSIS — Z8673 Personal history of transient ischemic attack (TIA), and cerebral infarction without residual deficits: Secondary | ICD-10-CM | POA: Insufficient documentation

## 2022-03-12 DIAGNOSIS — D6859 Other primary thrombophilia: Secondary | ICD-10-CM | POA: Insufficient documentation

## 2022-03-12 DIAGNOSIS — O99119 Other diseases of the blood and blood-forming organs and certain disorders involving the immune mechanism complicating pregnancy, unspecified trimester: Secondary | ICD-10-CM | POA: Insufficient documentation

## 2022-03-12 DIAGNOSIS — O0990 Supervision of high risk pregnancy, unspecified, unspecified trimester: Secondary | ICD-10-CM | POA: Diagnosis not present

## 2022-03-12 DIAGNOSIS — O99113 Other diseases of the blood and blood-forming organs and certain disorders involving the immune mechanism complicating pregnancy, third trimester: Secondary | ICD-10-CM | POA: Diagnosis not present

## 2022-03-13 ENCOUNTER — Inpatient Hospital Stay (HOSPITAL_COMMUNITY): Payer: Medicaid Other

## 2022-03-14 ENCOUNTER — Encounter (HOSPITAL_COMMUNITY): Payer: Self-pay | Admitting: Obstetrics and Gynecology

## 2022-03-14 ENCOUNTER — Inpatient Hospital Stay (HOSPITAL_COMMUNITY): Payer: Medicaid Other | Admitting: Anesthesiology

## 2022-03-14 ENCOUNTER — Encounter: Payer: Self-pay | Admitting: Obstetrics and Gynecology

## 2022-03-14 ENCOUNTER — Telehealth: Payer: Self-pay

## 2022-03-14 ENCOUNTER — Inpatient Hospital Stay (HOSPITAL_COMMUNITY)
Admission: RE | Admit: 2022-03-14 | Discharge: 2022-03-17 | DRG: 806 | Disposition: A | Discharge status: Home / Self Care | Payer: Medicaid Other | Attending: Family Medicine | Admitting: Family Medicine

## 2022-03-14 DIAGNOSIS — Z7982 Long term (current) use of aspirin: Secondary | ICD-10-CM | POA: Diagnosis not present

## 2022-03-14 DIAGNOSIS — Z23 Encounter for immunization: Secondary | ICD-10-CM

## 2022-03-14 DIAGNOSIS — O24424 Gestational diabetes mellitus in childbirth, insulin controlled: Principal | ICD-10-CM | POA: Diagnosis present

## 2022-03-14 DIAGNOSIS — Z3A37 37 weeks gestation of pregnancy: Secondary | ICD-10-CM

## 2022-03-14 DIAGNOSIS — Z3009 Encounter for other general counseling and advice on contraception: Secondary | ICD-10-CM | POA: Diagnosis not present

## 2022-03-14 DIAGNOSIS — O99214 Obesity complicating childbirth: Secondary | ICD-10-CM | POA: Diagnosis not present

## 2022-03-14 DIAGNOSIS — Z87891 Personal history of nicotine dependence: Secondary | ICD-10-CM | POA: Diagnosis not present

## 2022-03-14 DIAGNOSIS — O9902 Anemia complicating childbirth: Secondary | ICD-10-CM | POA: Diagnosis not present

## 2022-03-14 DIAGNOSIS — D649 Anemia, unspecified: Secondary | ICD-10-CM | POA: Diagnosis not present

## 2022-03-14 DIAGNOSIS — O9912 Other diseases of the blood and blood-forming organs and certain disorders involving the immune mechanism complicating childbirth: Secondary | ICD-10-CM | POA: Diagnosis present

## 2022-03-14 DIAGNOSIS — D6859 Other primary thrombophilia: Secondary | ICD-10-CM | POA: Diagnosis present

## 2022-03-14 DIAGNOSIS — O24425 Gestational diabetes mellitus in childbirth, controlled by oral hypoglycemic drugs: Secondary | ICD-10-CM | POA: Diagnosis not present

## 2022-03-14 DIAGNOSIS — O403XX1 Polyhydramnios, third trimester, fetus 1: Secondary | ICD-10-CM | POA: Diagnosis not present

## 2022-03-14 DIAGNOSIS — O403XX0 Polyhydramnios, third trimester, not applicable or unspecified: Secondary | ICD-10-CM | POA: Diagnosis present

## 2022-03-14 DIAGNOSIS — Z8673 Personal history of transient ischemic attack (TIA), and cerebral infarction without residual deficits: Secondary | ICD-10-CM | POA: Diagnosis not present

## 2022-03-14 DIAGNOSIS — O3663X0 Maternal care for excessive fetal growth, third trimester, not applicable or unspecified: Secondary | ICD-10-CM | POA: Diagnosis present

## 2022-03-14 DIAGNOSIS — Z30430 Encounter for insertion of intrauterine contraceptive device: Secondary | ICD-10-CM | POA: Diagnosis not present

## 2022-03-14 DIAGNOSIS — O24419 Gestational diabetes mellitus in pregnancy, unspecified control: Secondary | ICD-10-CM

## 2022-03-14 DIAGNOSIS — O24429 Gestational diabetes mellitus in childbirth, unspecified control: Secondary | ICD-10-CM | POA: Diagnosis not present

## 2022-03-14 DIAGNOSIS — O26893 Other specified pregnancy related conditions, third trimester: Secondary | ICD-10-CM | POA: Diagnosis not present

## 2022-03-14 LAB — TYPE AND SCREEN
ABO/RH(D): A POS
Antibody Screen: NEGATIVE

## 2022-03-14 LAB — COMPREHENSIVE METABOLIC PANEL
ALT: 23 U/L (ref 0–44)
AST: 19 U/L (ref 15–41)
Albumin: 2.3 g/dL — ABNORMAL LOW (ref 3.5–5.0)
Alkaline Phosphatase: 128 U/L — ABNORMAL HIGH (ref 38–126)
Anion gap: 16 — ABNORMAL HIGH (ref 5–15)
BUN: 6 mg/dL (ref 6–20)
CO2: 16 mmol/L — ABNORMAL LOW (ref 22–32)
Calcium: 8.9 mg/dL (ref 8.9–10.3)
Chloride: 105 mmol/L (ref 98–111)
Creatinine, Ser: 0.53 mg/dL (ref 0.44–1.00)
GFR, Estimated: >60 mL/min (ref >60)
Glucose, Bld: 152 mg/dL — ABNORMAL HIGH (ref 70–99)
Potassium: 3.6 mmol/L (ref 3.5–5.1)
Sodium: 137 mmol/L (ref 135–145)
Total Bilirubin: 0.4 mg/dL (ref 0.3–1.2)
Total Protein: 6.7 g/dL (ref 6.5–8.1)

## 2022-03-14 LAB — CBC
HCT: 29.9 % — ABNORMAL LOW (ref 36.0–46.0)
Hemoglobin: 9.3 g/dL — ABNORMAL LOW (ref 12.0–15.0)
MCH: 23.5 pg — ABNORMAL LOW (ref 26.0–34.0)
MCHC: 31.1 g/dL (ref 30.0–36.0)
MCV: 75.7 fL — ABNORMAL LOW (ref 80.0–100.0)
Platelets: 307 10*3/uL (ref 150–400)
RBC: 3.95 MIL/uL (ref 3.87–5.11)
RDW: 17 % — ABNORMAL HIGH (ref 11.5–15.5)
WBC: 10.8 10*3/uL — ABNORMAL HIGH (ref 4.0–10.5)
nRBC: 0.2 % (ref 0.0–0.2)

## 2022-03-14 LAB — GLUCOSE, CAPILLARY
Glucose-Capillary: 98 mg/dL (ref 70–99)
Glucose-Capillary: 92 mg/dL (ref 70–99)
Glucose-Capillary: 92 mg/dL (ref 70–99)

## 2022-03-14 MED ORDER — PHENYLEPHRINE 80 MCG/ML (10ML) SYRINGE FOR IV PUSH (FOR BLOOD PRESSURE SUPPORT)
80.0000 ug | PREFILLED_SYRINGE | INTRAVENOUS | Status: DC | PRN
  Filled 2022-03-14: qty 10

## 2022-03-14 MED ORDER — LIDOCAINE HCL (PF) 1 % IJ SOLN: 30.0000 mL | INTRAMUSCULAR | Status: DC | PRN

## 2022-03-14 MED ORDER — FENTANYL-BUPIVACAINE-NACL 0.5-0.125-0.9 MG/250ML-% EP SOLN
EPIDURAL | Status: DC | PRN
  Administered 2022-03-14: 12 mL/h via EPIDURAL

## 2022-03-14 MED ORDER — ZOLPIDEM TARTRATE 5 MG PO TABS: 5.0000 mg | ORAL_TABLET | Freq: Every evening | ORAL | Status: DC | PRN

## 2022-03-14 MED ORDER — EPHEDRINE 5 MG/ML INJ: 10.0000 mg | INTRAVENOUS | Status: DC | PRN

## 2022-03-14 MED ORDER — TERBUTALINE SULFATE 1 MG/ML IJ SOLN
0.2500 mg | Freq: Once | INTRAMUSCULAR | Status: AC | PRN
  Administered 2022-03-14: 0.25 mg via SUBCUTANEOUS
  Filled 2022-03-14: qty 1

## 2022-03-14 MED ORDER — FENTANYL CITRATE (PF) 100 MCG/2ML IJ SOLN
50.0000 ug | INTRAMUSCULAR | Status: DC | PRN
  Administered 2022-03-14 (×2): 100 ug via INTRAVENOUS
  Filled 2022-03-14 (×2): qty 2

## 2022-03-14 MED ORDER — LACTATED RINGERS IV SOLN: INTRAVENOUS | Status: DC

## 2022-03-14 MED ORDER — OXYCODONE-ACETAMINOPHEN 5-325 MG PO TABS: 1.0000 | ORAL_TABLET | ORAL | Status: DC | PRN

## 2022-03-14 MED ORDER — ACETAMINOPHEN 325 MG PO TABS: 650.0000 mg | ORAL_TABLET | ORAL | Status: DC | PRN

## 2022-03-14 MED ORDER — OXYCODONE-ACETAMINOPHEN 5-325 MG PO TABS: 2.0000 | ORAL_TABLET | ORAL | Status: DC | PRN

## 2022-03-14 MED ORDER — LACTATED RINGERS IV SOLN
500.0000 mL | Freq: Once | INTRAVENOUS | Status: AC
  Administered 2022-03-14: 500 mL via INTRAVENOUS

## 2022-03-14 MED ORDER — DIPHENHYDRAMINE HCL 50 MG/ML IJ SOLN: 12.5000 mg | INTRAMUSCULAR | Status: DC | PRN

## 2022-03-14 MED ORDER — MISOPROSTOL 25 MCG QUARTER TABLET
25.0000 ug | ORAL_TABLET | Freq: Once | ORAL | Status: AC
  Administered 2022-03-14: 25 ug via VAGINAL
  Filled 2022-03-14: qty 1

## 2022-03-14 MED ORDER — LIDOCAINE HCL (PF) 1 % IJ SOLN
INTRAMUSCULAR | Status: DC | PRN
  Administered 2022-03-14: 5 mL via EPIDURAL

## 2022-03-14 MED ORDER — OXYTOCIN BOLUS FROM INFUSION: 333.0000 mL | Freq: Once | INTRAVENOUS | Status: DC

## 2022-03-14 MED ORDER — SOD CITRATE-CITRIC ACID 500-334 MG/5ML PO SOLN: 30.0000 mL | ORAL | Status: DC | PRN

## 2022-03-14 MED ORDER — MISOPROSTOL 50MCG HALF TABLET
50.0000 ug | ORAL_TABLET | Freq: Once | ORAL | Status: AC
  Administered 2022-03-14: 50 ug via ORAL
  Filled 2022-03-14: qty 1

## 2022-03-14 MED ORDER — HYDROXYZINE HCL 50 MG PO TABS: 50.0000 mg | ORAL_TABLET | Freq: Four times a day (QID) | ORAL | Status: DC | PRN

## 2022-03-14 MED ORDER — OXYTOCIN-SODIUM CHLORIDE 30-0.9 UT/500ML-% IV SOLN: 2.5000 [IU]/h | INTRAVENOUS | Status: DC

## 2022-03-14 MED ORDER — FLEET ENEMA 7-19 GM/118ML RE ENEM: 1.0000 | ENEMA | Freq: Every day | RECTAL | Status: DC | PRN

## 2022-03-14 MED ORDER — PHENYLEPHRINE 80 MCG/ML (10ML) SYRINGE FOR IV PUSH (FOR BLOOD PRESSURE SUPPORT)
80.0000 ug | PREFILLED_SYRINGE | INTRAVENOUS | Status: DC | PRN
  Administered 2022-03-14: 160 ug via INTRAVENOUS

## 2022-03-14 MED ORDER — FENTANYL-BUPIVACAINE-NACL 0.5-0.125-0.9 MG/250ML-% EP SOLN
12.0000 mL/h | EPIDURAL | Status: DC | PRN
  Filled 2022-03-14: qty 250

## 2022-03-14 MED ORDER — LACTATED RINGERS IV SOLN
500.0000 mL | INTRAVENOUS | Status: DC | PRN
  Administered 2022-03-14: 500 mL via INTRAVENOUS

## 2022-03-14 MED ORDER — OXYTOCIN-SODIUM CHLORIDE 30-0.9 UT/500ML-% IV SOLN
1.0000 m[IU]/min | INTRAVENOUS | Status: DC
  Administered 2022-03-14: 2 m[IU]/min via INTRAVENOUS
  Administered 2022-03-14: 4 m[IU]/min via INTRAVENOUS
  Administered 2022-03-14: 6 m[IU]/min via INTRAVENOUS
  Administered 2022-03-15: 2 m[IU]/min via INTRAVENOUS
  Filled 2022-03-14: qty 500

## 2022-03-14 MED ORDER — ONDANSETRON HCL 4 MG/2ML IJ SOLN: 4.0000 mg | Freq: Four times a day (QID) | INTRAMUSCULAR | Status: DC | PRN

## 2022-03-14 NOTE — Anesthesia Preprocedure Evaluation (Signed)
Anesthesia Evaluation  Patient identified by MRN, date of birth, ID band Patient awake    Reviewed: Allergy & Precautions, NPO status , Patient's Chart, lab work & pertinent test results  Airway Mallampati: III  TM Distance: >3 FB Neck ROM: Full    Dental no notable dental hx. (+) Teeth Intact, Dental Advisory Given   Pulmonary former smoker,    Pulmonary exam normal breath sounds clear to auscultation       Cardiovascular negative cardio ROS Normal cardiovascular exam Rhythm:Regular Rate:Normal     Neuro/Psych CVA    GI/Hepatic   Endo/Other  diabetes, Gestational  Renal/GU      Musculoskeletal   Abdominal (+) + obese (BMI 48.41),   Peds  Hematology  (+) Blood dyscrasia, anemia , Protein S deficiency  Off lovenox 10/18  Lab Results      Component                Value               Date                      WBC                      10.8 (H)            03/14/2022                HGB                      9.3 (L)             03/14/2022                HCT                      29.9 (L)            03/14/2022                MCV                      75.7 (L)            03/14/2022                PLT                      307                 03/14/2022              Anesthesia Other Findings   Reproductive/Obstetrics (+) Pregnancy                            Anesthesia Physical Anesthesia Plan  ASA: 3  Anesthesia Plan: Epidural   Post-op Pain Management:    Induction:   PONV Risk Score and Plan:   Airway Management Planned:   Additional Equipment:   Intra-op Plan:   Post-operative Plan:   Informed Consent: I have reviewed the patients History and Physical, chart, labs and discussed the procedure including the risks, benefits and alternatives for the proposed anesthesia with the patient or authorized representative who has indicated his/her understanding and acceptance.        Plan Discussed with:   Anesthesia Plan Comments: (37.1 wk G4P3 w gDM BMi 48.4,  Anemia Hgb 9.3 and Protein S deficiency  (last lovenox 10/18) for LEA)        Anesthesia Quick Evaluation

## 2022-03-14 NOTE — Progress Notes (Signed)
Patient ID: Angel [name], female   DOB: Aug 03, 1990, 31 y.o.   MRN: [id number] Angel [name] is a 31 y.o. [phone] at [redacted]w[redacted]d admitted for induction of labor due to diabetes mellitus A2DM   Subjective: beginning to get uncomfortable w/ contractions  Objective: BP 105/74   Pulse (!) 191   Temp 97.9 F (36.6 C) (Oral)   Resp 16   Ht 5\' 9"  (1.753 m)   Wt (!) 148.7 kg   LMP 06/04/2021   SpO2 99%   BMI 48.41 kg/m  No intake/output data recorded.  FHR baseline 145 bpm, Variability: moderate, Accelerations:present, Decelerations:  Present occasional late decels noted. Patient repositioned, fetal heart rate recovered.  Toco: q 2-4 mins   SVE:   Dilation: 3 Effacement (%): 70 Station: -2 Exam by:: Minna Merritts, SNM  To start pitocin 2x2  Labs: Lab Results  Component Value Date   WBC 10.8 (H) 03/14/2022   HGB 9.3 (L) 03/14/2022   HCT 29.9 (L) 03/14/2022   MCV 75.7 (L) 03/14/2022   PLT 307 03/14/2022    Assessment / Plan: IOL d/t diabetes mellitus A2DM , s/p cytotec x 1- last dose at 1248. Pitocin: will begin per protocol.   Labor: early Fetal Wellbeing:  Category II Repositioned, fetal heart rate recovered.  Pain Control:  labor support without medications Pre-eclampsia: N/A I/D:  GBS neg Anticipated MOD: NSVB  [name] SNM 03/14/2022, 4:53 PM

## 2022-03-14 NOTE — Progress Notes (Signed)
Labor Progress Note Zamya Culhane is a 31 y.o. [phone] at [redacted]w[redacted]d presented for A2GDM w/ LGA and mild poly.   S: No concerns at this time.   O:  BP (!) 115/53   Pulse (!) 117   Temp 97.9 F (36.6 C) (Oral)   Resp 16   Ht 5\' 9"  (1.753 m)   Wt (!) 148.7 kg   LMP 06/04/2021   SpO2 100%   BMI 48.41 kg/m  EFM: 125bpm/moderate/+accels, prolonged and late decels  CVE: Dilation: 3 Effacement (%): Thick Station: -2, -3 Presentation: Vertex Exam by:: Tommie Raymond, RN   A&P: 31 y.o. N3V6701 [redacted]w[redacted]d here for IOL 2/2 GDM and poly.  #Labor: Recent epidural with prolonged decels to 50-60. BP wnl. Anesthesia aware. Pit off, bolus x2, terb x1.  #Pain: Epidural #FWB: Cat II, improving with measures discussed above.  #GBS negative  GDM Most recent CBG 92.  -Continue to monitor  Annice Jolly Autry-Lott, DO 10:23 PM

## 2022-03-14 NOTE — H&P (Addendum)
OBSTETRIC ADMISSION HISTORY AND PHYSICAL  Shadow Briseyda Fehr is a 31 y.o. female [phone] with IUP at 41w1dby 82w5dltrasound presenting for IOL. She reports +FMs, No LOF, no VB, no blurry vision, headaches or peripheral edema, and RUQ pain.  She plans on breast feeding. She request Mirena IUD for birth control. [facility] Dating: By 8w61w5dtrasound ---> Estimated Date of Delivery: 04/03/22  Sono:    _0 , CWD, normal anatomy, cephalic presentation, vertex lie, 3315g, 80% EFW   Prenatal History/Complications: -Gestational Diabetes -Macrosomia -Protein S deficiency -History of ischemic stroke x2 in 2015 -Anemia -Anxiety  Past Medical History: Past Medical History:  Diagnosis Date   Acute ischemic stroke ([place]CC[place]2/19/2015   Anemia    Anxiety    Basilic vein thrombosis 09/03/08/3159Formatting of this note might be different from the original. Left, Per [place]/17   Gestational diabetes    H/O insulin controlled gestational diabetes in prior pregnancy, currently pregnant 09/27/2019   Headache    Hyperlipidemia    Migraine 03/13/2014   Morbid obesity ([place]CC[place]0/19/2015   Obesity    Prediabetes    Protein S deficiency ([place]CR[place]   Past Surgical History: Past Surgical History:  Procedure Laterality Date   none     TEE WITHOUT CARDIOVERSION N/A 05/17/2014   Procedure: TRANSESOPHAGEAL ECHOCARDIOGRAM (TEE);  Surgeon: PetJosue HectorD;  [facility];  Service: Cardiovascular;  Laterality: N/A;    Obstetrical History: OB History     Gravida  4   Para  3   Term  3   Preterm      AB      Living  3      SAB      IAB      Ectopic      Multiple  0   Live Births  3           Social History Social History   Socioeconomic History   Marital status: Married    Spouse name: Not on file   Number of children: 1   Years of education: college   Highest education level: Not on file  Occupational History    Employer:  OTHER    Comment: [employer]  Tobacco Use   Smoking status: Former   Smokeless tobacco: Never  VapScientific laboratory techniciane: Never used  Substance and Sexual Activity   Alcohol use: Yes    Comment: Occasionally, not since confirmed pregnancy   Drug use: No   Sexual activity: Yes    Partners: Male    Birth control/protection: None  Other Topics Concern   Not on file  Social History Narrative   Patient lives at home with family.   Caffeine Use: 2 sodas daily   Social Determinants of Health   Financial Resource Strain: Not on file  Food Insecurity: Not on file  Transportation Needs: Not on file  Physical Activity: Not on file  Stress: Not on file  Social Connections: Not on file    Family History: Family History  Problem Relation Age of Onset   Hypertension Mother    Asthma Brother    Cancer Paternal Uncle    Diabetes Paternal Uncle    Heart disease Maternal Grandmother    Heart disease Maternal Grandfather    Diabetes Paternal Grandmother    Stroke Neg Hx    Birth defects Neg Hx     Allergies: No  Known Allergies  Medications Prior to Admission  Medication Sig Dispense Refill Last Dose   enoxaparin (LOVENOX) 150 MG/ML injection Inject 1 mL (150 mg total) into the skin every 12 (twelve) hours. 30 mL 6 03/12/2022   Accu-Chek Softclix Lancets lancets 1 each by Other route 4 (four) times daily. 100 each 12    aspirin EC 81 MG tablet Take 81 mg by mouth daily. Swallow whole.      Blood Glucose Monitoring Suppl (ACCU-CHEK GUIDE) w/Device KIT 1 Device by Does not apply route 4 (four) times daily. 1 kit 0    Blood Pressure Monitoring (BLOOD PRESSURE KIT) DEVI 1 kit by Does not apply route as needed. 1 each 0    Blood Pressure Monitoring (BLOOD PRESSURE KIT) DEVI 1 kit by Does not apply route as needed. 1 each 0    glucose blood (ACCU-CHEK GUIDE) test strip Use to check blood sugars four times a day was instructed 50 each 12    insulin aspart (NOVOLOG) 100 UNIT/ML  FlexPen Inject 8 units into the skin 3 (three) times daily before meals 15 mL 11    insulin NPH Human (NOVOLIN N) 100 UNIT/ML injection Inject 16 units total into skin daily with breakfast. Inject 8 units total into skin daily at bedtime. 10 mL 3    Insulin Syringe-Needle U-100 27G X 1/2" 0.5 ML MISC 1 Syringe by Does not apply route in the morning and at bedtime. 100 each 1    omeprazole (PRILOSEC) 40 MG capsule Take 40 mg by mouth daily.      Prenatal MV & Min w/FA-DHA (PRENATAL GUMMIES) 0.18-25 MG CHEW Chew by mouth.        Review of Systems   All systems reviewed and negative except as stated in HPI  Last menstrual period 06/04/2021, unknown if currently breastfeeding. General appearance: alert, cooperative, appears stated age, and no distress Lungs: clear to auscultation bilaterally Heart: regular rate and rhythm Abdomen: soft, non-tender; bowel sounds normal Pelvic: 3/50/-3 Extremities: Homans sign is negative, no sign of DVT DTR's: 2+ Presentation: cephalic Fetal monitoringBaseline: 145 bpm, Variability: Good {> 6 bpm), and Accelerations: Reactive Uterine activityNone     Prenatal labs: ABO, Rh: A/Positive/-- (05/23 0848) Antibody: Negative (05/23 0848) Rubella: 18.90 (05/23 0848) RPR: Non Reactive (08/16 1014)  HBsAg: Negative (05/23 0848)  HIV: Non Reactive (08/16 1014)  GBS: Negative/-- (10/10 0919)  1 hr Glucola 219 Genetic screening: Normal Anatomy US: Normal  Prenatal Transfer Tool  Maternal Diabetes: Yes:  Diabetes Type:  Insulin/Medication controlled Genetic Screening: Normal Maternal Ultrasounds/Referrals: Normal Fetal Ultrasounds or other Referrals:  Referred to Materal Fetal Medicine  Maternal Substance Abuse:  No Significant Maternal Medications:  Meds include: Other:  Lovenox Significant Maternal Lab Results:  Group B Strep negative Number of Prenatal Visits:greater than 3 verified prenatal visits Other Comments:  None  No results found for this or  any previous visit (from the past 24 hour(s)).  Patient Active Problem List   Diagnosis Date Noted   Gestational diabetes    BMI 45.0-49.9, adult ([place]) 11/14/2021   Prediabetes 10/17/2021   Supervision of high risk pregnancy, antepartum 08/27/2021   Protein S deficiency affecting pregnancy, antepartum ([place]) 09/12/2019   History of pre-eclampsia in prior pregnancy, currently pregnant 09/12/2019   Maternal morbid obesity, antepartum ([place]) 09/12/2019   Vitamin D deficiency 02/12/2015   Panic attacks 02/09/2015   Anxiety state 02/09/2015   Frequent headaches 02/09/2015   Protein S deficiency (HCC)    red chart,History  of ischemic stroke x 2 in 2015 05/13/2014   Cerebral infarction ([place]) 05/13/2014   Anemia    Migraine 03/13/2014   Morbid obesity ([place]) 03/13/2014    Assessment/Plan:  Gwyneth Fernandez is a 31 y.o. G4P3003 at 58w1dhere for IOL.  #Labor: Plan for cytotec 567m oral/2534mvaginal and ambulate in halls. Plan for pitocin/AROM later.  #Pain: Patient plans to get an epidural at a later time #FWB: Category I #ID:  GBS negative #MOF: Breast #MOC: Mirena IUD #Circ:  N/a  AdrOwens Lofflertudent-MidWife  03/14/2022, 11:35 AM

## 2022-03-14 NOTE — Telephone Encounter (Signed)
Informed pt to expect a call from L&D today. Patient said they contacted her already and informed her to report at 11 am.

## 2022-03-14 NOTE — Progress Notes (Signed)
LABOR PROGRESS NOTE  Angel [name] is a 31 y.o. [phone] at [redacted]w[redacted]d  admitted for IOL s/t A2GDM (insulin) and poly.   Subjective: Comfortable and laying in bed.   Objective: BP 137/66   Pulse 98   Temp 97.9 F (36.6 C) (Oral)   Resp 16   Ht 5\' 9"  (1.753 m)   Wt (!) 148.7 kg   LMP 06/04/2021   SpO2 95%   BMI 48.41 kg/m  or  Vitals:   03/14/22 2230 03/14/22 2300 03/14/22 2310 03/14/22 2315  BP: 104/87 (!) 109/57 129/70 137/66  Pulse: (!) 144 (!) 102 96 98  Resp:      Temp:      TempSrc:      SpO2:      Weight:      Height:        SVE Dilation: 3 Effacement (%): Thick Station: -2, -3 Presentation: Vertex Exam by:: Tommie Raymond, RN FHT: baseline rate 130, moderate varibility, +acel, late decels Toco: every 3-4 minutes   Labs: Lab Results  Component Value Date   WBC 10.8 (H) 03/14/2022   HGB 9.3 (L) 03/14/2022   HCT 29.9 (L) 03/14/2022   MCV 75.7 (L) 03/14/2022   PLT 307 03/14/2022    Patient Active Problem List   Diagnosis Date Noted   GDM (gestational diabetes mellitus) 03/14/2022   Gestational diabetes    BMI 45.0-49.9, adult ([place]) 11/14/2021   Prediabetes 10/17/2021   Supervision of high risk pregnancy, antepartum 08/27/2021   Protein S deficiency affecting pregnancy, antepartum ([place]) 09/12/2019   History of pre-eclampsia in prior pregnancy, currently pregnant 09/12/2019   Maternal morbid obesity, antepartum ([place]) 09/12/2019   Vitamin D deficiency 02/12/2015   Panic attacks 02/09/2015   Anxiety state 02/09/2015   Frequent headaches 02/09/2015   Protein S deficiency ([place])    red chart,History of ischemic stroke x 2 in 2015 05/13/2014   Cerebral infarction ([place]) 05/13/2014   Anemia    Migraine 03/13/2014   Morbid obesity ([place]) 03/13/2014    Assessment / Plan: Angel [name] at [redacted]w[redacted]d here for IOL s/t A2GDM and Poly   Labor: Pitocin stopped, s/p one dose of terb, continue to monitor  Fetal Wellbeing:  Cat 2 d/t recurrent late decels, attempting  position changes, s/p terb, phenyl 160 mgs x 2  Pain Control:  Epidural  Anticipated MOD:  SVD  Lowry Ram, MD  PGY-1, [facility]  03/14/2022, 11:39 PM

## 2022-03-14 NOTE — Anesthesia Procedure Notes (Signed)
Epidural Patient location during procedure: OB Start time: 03/14/2022 9:31 PM End time: 03/14/2022 9:44 PM  Staffing Anesthesiologist: Barnet Glasgow, MD Performed: anesthesiologist   Preanesthetic Checklist Completed: patient identified, IV checked, site marked, risks and benefits discussed, surgical consent, monitors and equipment checked, pre-op evaluation and timeout performed  Epidural Patient position: sitting Prep: DuraPrep and site prepped and draped Patient monitoring: continuous pulse ox and blood pressure Approach: midline Location: L3-L4 Injection technique: LOR air  Needle:  Needle type: Tuohy  Needle gauge: 17 G Needle length: 9 cm and 9 Needle insertion depth: 9 cm Catheter type: closed end flexible Catheter size: 19 Gauge Catheter at skin depth: 15 cm Test dose: negative  Assessment Events: blood not aspirated, injection not painful, no injection resistance, no paresthesia and negative IV test  Additional Notes Patient identified. Risks/Benefits/Options discussed with patient including but not limited to bleeding, infection, nerve damage, paralysis, failed block, incomplete pain control, headache, blood pressure changes, nausea, vomiting, reactions to medication both or allergic, itching and postpartum back pain. Confirmed with bedside nurse the patient's most recent platelet count. Confirmed with patient that they are not currently taking any anticoagulation, have any bleeding history or any family history of bleeding disorders. Patient expressed understanding and wished to proceed. All questions were answered. Sterile technique was used throughout the entire procedure. Please see nursing notes for vital signs. Test dose was given through epidural needle and negative prior to continuing to dose epidural or start infusion. Warning signs of high block given to the patient including shortness of breath, tingling/numbness in hands, complete motor block, or any  concerning symptoms with instructions to call for help. Patient was given instructions on fall risk and not to get out of bed. All questions and concerns addressed with instructions to call with any issues. 1 Attempt (S) . Patient tolerated procedure well.

## 2022-03-15 ENCOUNTER — Encounter (HOSPITAL_COMMUNITY): Payer: Self-pay | Admitting: Obstetrics & Gynecology

## 2022-03-15 DIAGNOSIS — Z3009 Encounter for other general counseling and advice on contraception: Secondary | ICD-10-CM | POA: Diagnosis not present

## 2022-03-15 DIAGNOSIS — O24425 Gestational diabetes mellitus in childbirth, controlled by oral hypoglycemic drugs: Secondary | ICD-10-CM

## 2022-03-15 DIAGNOSIS — O403XX1 Polyhydramnios, third trimester, fetus 1: Secondary | ICD-10-CM

## 2022-03-15 DIAGNOSIS — Z3A37 37 weeks gestation of pregnancy: Secondary | ICD-10-CM

## 2022-03-15 HISTORY — PX: IUD INSERTION: OBO1003

## 2022-03-15 LAB — GLUCOSE, CAPILLARY: Glucose-Capillary: 86 mg/dL (ref 70–99)

## 2022-03-15 LAB — RPR: RPR Ser Ql: NONREACTIVE

## 2022-03-15 MED ORDER — COCONUT OIL OIL: 1.0000 | TOPICAL_OIL | Status: DC | PRN

## 2022-03-15 MED ORDER — SIMETHICONE 80 MG PO CHEW: 80.0000 mg | CHEWABLE_TABLET | ORAL | Status: DC | PRN

## 2022-03-15 MED ORDER — TETANUS-DIPHTH-ACELL PERTUSSIS 5-2.5-18.5 LF-MCG/0.5 IM SUSY: 0.5000 mL | PREFILLED_SYRINGE | Freq: Once | INTRAMUSCULAR | Status: DC

## 2022-03-15 MED ORDER — WITCH HAZEL-GLYCERIN EX PADS: 1.0000 | MEDICATED_PAD | CUTANEOUS | Status: DC | PRN

## 2022-03-15 MED ORDER — SENNOSIDES-DOCUSATE SODIUM 8.6-50 MG PO TABS
2.0000 | ORAL_TABLET | Freq: Every day | ORAL | Status: DC
  Administered 2022-03-16 – 2022-03-17 (×2): 2 via ORAL
  Filled 2022-03-15 (×2): qty 2

## 2022-03-15 MED ORDER — LEVONORGESTREL 20 MCG/DAY IU IUD
1.0000 | INTRAUTERINE_SYSTEM | Freq: Once | INTRAUTERINE | Status: AC
  Administered 2022-03-15: 1 via INTRAUTERINE
  Filled 2022-03-15: qty 1

## 2022-03-15 MED ORDER — DIPHENHYDRAMINE HCL 25 MG PO CAPS: 25.0000 mg | ORAL_CAPSULE | Freq: Four times a day (QID) | ORAL | Status: DC | PRN

## 2022-03-15 MED ORDER — DIBUCAINE (PERIANAL) 1 % EX OINT: 1.0000 | TOPICAL_OINTMENT | CUTANEOUS | Status: DC | PRN

## 2022-03-15 MED ORDER — IBUPROFEN 600 MG PO TABS
600.0000 mg | ORAL_TABLET | Freq: Four times a day (QID) | ORAL | Status: DC
  Administered 2022-03-15 – 2022-03-17 (×8): 600 mg via ORAL
  Filled 2022-03-15 (×10): qty 1

## 2022-03-15 MED ORDER — ENOXAPARIN SODIUM 150 MG/ML IJ SOSY
150.0000 mg | PREFILLED_SYRINGE | Freq: Two times a day (BID) | INTRAMUSCULAR | Status: DC
  Administered 2022-03-15 – 2022-03-17 (×4): 150 mg via SUBCUTANEOUS
  Filled 2022-03-15 (×5): qty 1

## 2022-03-15 MED ORDER — ENOXAPARIN SODIUM 150 MG/ML IJ SOSY: 150.0000 mg | PREFILLED_SYRINGE | Freq: Two times a day (BID) | INTRAMUSCULAR | Status: DC

## 2022-03-15 MED ORDER — ZOLPIDEM TARTRATE 5 MG PO TABS: 5.0000 mg | ORAL_TABLET | Freq: Every evening | ORAL | Status: DC | PRN

## 2022-03-15 MED ORDER — BENZOCAINE-MENTHOL 20-0.5 % EX AERO
1.0000 | INHALATION_SPRAY | CUTANEOUS | Status: DC | PRN
  Administered 2022-03-15: 1 via TOPICAL
  Filled 2022-03-15: qty 56

## 2022-03-15 MED ORDER — PRENATAL MULTIVITAMIN CH
1.0000 | ORAL_TABLET | Freq: Every day | ORAL | Status: DC
  Administered 2022-03-15 – 2022-03-17 (×3): 1 via ORAL
  Filled 2022-03-15 (×4): qty 1

## 2022-03-15 MED ORDER — ONDANSETRON HCL 4 MG PO TABS: 4.0000 mg | ORAL_TABLET | ORAL | Status: DC | PRN

## 2022-03-15 MED ORDER — ACETAMINOPHEN 325 MG PO TABS
650.0000 mg | ORAL_TABLET | ORAL | Status: DC | PRN
  Administered 2022-03-15 – 2022-03-16 (×2): 650 mg via ORAL
  Filled 2022-03-15 (×2): qty 2

## 2022-03-15 MED ORDER — ONDANSETRON HCL 4 MG/2ML IJ SOLN: 4.0000 mg | INTRAMUSCULAR | Status: DC | PRN

## 2022-03-15 NOTE — Discharge Summary (Signed)
Postpartum Discharge Summary  Date of Service updated***     Patient Name: Angel [name] DOB: 10/17/1990 MRN: [id number]  Date of admission: 03/14/2022 Delivery date:03/15/2022  Delivering provider: Lowry Ram  Date of discharge: 03/15/2022  Admitting diagnosis: GDM (gestational diabetes mellitus) [O24.419] Intrauterine pregnancy: [redacted]w[redacted]d    Secondary diagnosis:  Principal Problem:   GDM (gestational diabetes mellitus)  Additional problems: GDM controlled by insulin, polyhydramnios, obesity     Discharge diagnosis: Term Pregnancy Delivered and GDM A2                                              Post partum procedures: Post partum IUD (Mirena) Augmentation: AROM, Pitocin, and Cytotec Complications: None  Hospital course: Induction of Labor With Vaginal Delivery   31y.o. yo [phone] admitted to the hospital 03/14/2022 for induction of labor.  Indication for induction: A2 DM.  Patient had an labor course complicated by recurrent variable decelerations that required discontinuation of pitocin and terb x1. Membrane Rupture Time/Date: 7:13 AM ,03/15/2022   Delivery Method:Vaginal, Spontaneous  Episiotomy: None  Lacerations:  1st degree  Details of delivery can be found in separate delivery note.  Patient had a postpartum course complicated by***. Patient is discharged home 03/15/22.  Newborn Data: Birth date:03/15/2022  Birth time:7:38 AM  Gender:Female  Living status:  Apgars: ,  Weight:   Magnesium Sulfate received: No BMZ received: No Rhophylac:N/A MMR:{MMR:30440033} T-DaP:{Tdap:23962} Flu: {F{ZRA:07622}ransfusion:No  Physical exam  Vitals:   03/15/22 0747 03/15/22 0802 03/15/22 0815 03/15/22 0832  BP: 130/72 124/71 134/67 133/78  Pulse: 80 89 (!) 107 68  Resp: _0 Temp:  97.8 F (36.6 C)    TempSrc:  Oral    SpO2:      Weight:      Height:       General: {Exam; general:21111117} Lochia: {Desc;  appropriate/inappropriate:30686::"appropriate"} Uterine Fundus: {Desc; firm/soft:30687} Incision: {Exam; incision:21111123} DVT Evaluation: {Exam; dvt:2111122} Labs: Lab Results  Component Value Date   WBC 10.8 (H) 03/14/2022   HGB 9.3 (L) 03/14/2022   HCT 29.9 (L) 03/14/2022   MCV 75.7 (L) 03/14/2022   PLT 307 03/14/2022      Latest Ref Rng & Units 03/14/2022   11:56 AM  CMP  Glucose 70 - 99 mg/dL 152   BUN 6 - 20 mg/dL 6   Creatinine 0.44 - 1.00 mg/dL 0.53   Sodium 135 - 145 mmol/L 137   Potassium 3.5 - 5.1 mmol/L 3.6   Chloride 98 - 111 mmol/L 105   CO2 22 - 32 mmol/L 16   Calcium 8.9 - 10.3 mg/dL 8.9   Total Protein 6.5 - 8.1 g/dL 6.7   Total Bilirubin 0.3 - 1.2 mg/dL 0.4   Alkaline Phos 38 - 126 U/L 128   AST 15 - 41 U/L 19   ALT 0 - 44 U/L 23    Edinburgh Score:    12/26/2019    1:37 PM  Edinburgh Postnatal Depression Scale Screening Tool  I have been able to laugh and see the funny side of things. 0  I have looked forward with enjoyment to things. 0  I have blamed myself unnecessarily when things went wrong. 1  I have been anxious or worried for no good reason. 2  I have felt scared or panicky for no  good reason. 0  Things have been getting on top of me. 1  I have been so unhappy that I have had difficulty sleeping. 0  I have felt sad or miserable. 0  I have been so unhappy that I have been crying. 0  The thought of harming myself has occurred to me. 0  Edinburgh Postnatal Depression Scale Total 4     After visit meds:  Allergies as of 03/15/2022   No Known Allergies   Med Rec must be completed prior to using this [facility]***        Discharge home in stable condition Infant Feeding: Breast Infant Disposition:home with mother Discharge instruction: per After Visit Summary and Postpartum booklet. Activity: Advance as tolerated. Pelvic rest for 6 weeks.  Diet: routine diet Future Appointments:No future appointments. Follow up Visit: Message  sent to [facility] by [place] on 03/15/2022  Please schedule this patient for a In person postpartum visit in 6 weeks with the following provider: MD and APP. Additional Postpartum F/U: check strings   High risk pregnancy complicated by:  Hx of stroke, A2GDM Delivery mode:  Vaginal, Spontaneous  Anticipated Birth Control:  PP IUD placed   03/15/2022 Lowry Ram, MD

## 2022-03-15 NOTE — Lactation Note (Signed)
This note was copied from a baby's chart. Lactation Consultation Note  Patient Name: Girl Dyonna Jaspers [id number] Date: 03/15/2022   Age:32 hours RN Charleston Ropes) will ask Birth Parent if she would like to be seen by [place] and will call [place] on Vocera .  Maternal Data    Feeding Nipple Type: Slow - flow  LATCH Score                    Lactation Tools Discussed/Used    Interventions    Discharge    Consult Status      Vicente Serene 03/15/2022, 10:30 PM

## 2022-03-15 NOTE — Lactation Note (Signed)
This note was copied from a baby's chart. Lactation Consultation Note  Patient Name: Angel [name] [id number] Date: 03/15/2022 Reason for consult: L&D Initial assessment;Early term 37-38.6wks Age:32 hours  P4, [redacted]w[redacted]d.  Baby cueing.  Assisted with latching in football hold.  Lactation to follow up on MBU.   Maternal Data Does the patient have breastfeeding experience prior to this delivery?: No (Mother states her first three children would not latch, she attempted)  Feeding Mother's Current Feeding Choice: Breast Milk  Interventions Interventions: Assisted with latch;Skin to skin;Education  Consult Status Consult Status: Follow-up from L&D    [name] [facility] 03/15/2022, 8:50 AM

## 2022-03-15 NOTE — Lactation Note (Signed)
This note was copied from a baby's chart. Lactation Consultation Note  Patient Name: Angel [name] [id number] Date: 03/15/2022 Reason for consult: Follow-up assessment;1st time breastfeeding;Early term 37-38.6wks;Maternal endocrine disorder Age:32 hours  P4, [redacted]w[redacted]d.  Baby BS 38.  Baby recently received 17 ml and 15 ml of formula due to low blood sugar and is sleeping.  Placed baby skin to skin on birth parent's chest until blood sugar recheck.   Lactation to follow up this evening to observe latch. Feed on demand with cues.  Goal 8-12+ times per day after first 24 hrs.  Place baby STS if not cueing.  Mom made aware of O/P services, breastfeeding support groups,and our phone # for post-discharge questions.    Feeding Mother's Current Feeding Choice: Breast Milk and Formula Nipple Type: Slow - flow   Interventions Interventions: Skin to skin;Education   Consult Status Consult Status: Follow-up Date: 03/16/22 Follow-up type: In-patient    [name] [employer] 03/15/2022, 1:56 PM

## 2022-03-15 NOTE — Anesthesia Postprocedure Evaluation (Signed)
Anesthesia Post Note  Patient: Angel [name]  Procedure(s) Performed: AN AD [place]     Patient location during evaluation: Mother Baby Anesthesia Type: Epidural Level of consciousness: awake Pain management: satisfactory to patient Vital Signs Assessment: post-procedure vital signs reviewed and stable Respiratory status: spontaneous breathing Cardiovascular status: stable Anesthetic complications: no   No notable events documented.  Last Vitals:  Vitals:   03/15/22 0950 03/15/22 1103  BP: 126/69 108/70  Pulse: 61 77  Resp: 18 17  Temp: 36.5 C 36.6 C  SpO2:  100%    Last Pain:  Vitals:   03/15/22 0950  TempSrc: Oral  PainSc: 0-No pain   Pain Goal:                   [employer]

## 2022-03-15 NOTE — Progress Notes (Signed)
LABOR PROGRESS NOTE  Angel [name] is a 31 y.o. [phone] at [redacted]w[redacted]d  admitted for IOL s/t A2GDM (insulin) and poly.   Subjective: Comfortable and sleeping in bed.   Objective: BP (!) 102/46   Pulse 73   Temp 98 F (36.7 C) (Oral)   Resp 16   Ht 5\' 9"  (1.753 m)   Wt (!) 148.7 kg   LMP 06/04/2021   SpO2 95%   BMI 48.41 kg/m  or  Vitals:   03/15/22 0400 03/15/22 0430 03/15/22 0500 03/15/22 0535  BP: 119/78 119/66 133/77 (!) 102/46  Pulse: 65 73 76 73  Resp:  16    Temp:  98 F (36.7 C)    TempSrc:  Oral    SpO2:      Weight:      Height:        SVE 5.5/70/ballotable  Exam by Lowry Ram, MD FHT: baseline rate 120, moderate varibility, +acel, -decels Toco: every 3-4 minutes   Labs: Lab Results  Component Value Date   WBC 10.8 (H) 03/14/2022   HGB 9.3 (L) 03/14/2022   HCT 29.9 (L) 03/14/2022   MCV 75.7 (L) 03/14/2022   PLT 307 03/14/2022    Patient Active Problem List   Diagnosis Date Noted   GDM (gestational diabetes mellitus) 03/14/2022   Gestational diabetes    BMI 45.0-49.9, adult ([place]) 11/14/2021   Prediabetes 10/17/2021   Supervision of high risk pregnancy, antepartum 08/27/2021   Protein S deficiency affecting pregnancy, antepartum ([place]) 09/12/2019   History of pre-eclampsia in prior pregnancy, currently pregnant 09/12/2019   Maternal morbid obesity, antepartum ([place]) 09/12/2019   Vitamin D deficiency 02/12/2015   Panic attacks 02/09/2015   Anxiety state 02/09/2015   Frequent headaches 02/09/2015   Protein S deficiency (HCC)    red chart,History of ischemic stroke x 2 in 2015 05/13/2014   Cerebral infarction ([place]) 05/13/2014   Anemia    Migraine 03/13/2014   Morbid obesity ([place]) 03/13/2014    Assessment / Plan: 31 y.o. G4P3003 at [redacted]w[redacted]d here for IOL s/t A2GDM and Poly   Labor: Pitocin at 12, continue to increase as tolerated, AROM once baby head more well applied, continue to monitor Fetal Wellbeing:  Cat 1 Pain Control:  Epidural   Anticipated MOD:  SVD  Lowry Ram, MD  PGY-1, [facility]  03/15/2022, 6:09 AM

## 2022-03-15 NOTE — Lactation Note (Signed)
This note was copied from a baby's chart. Lactation Consultation Note  Patient Name: Angel [name] [id number] Date: 03/15/2022   Age:32 hours Per RN Charleston Ropes) Birth Parent will call at the front desk when ready to be seen by [place]. Maternal Data    Feeding Nipple Type: Slow - flow  LATCH Score                    Lactation Tools Discussed/Used    Interventions    Discharge    Consult Status      Vicente Serene 03/15/2022, 11:37 PM

## 2022-03-15 NOTE — Anesthesia Postprocedure Evaluation (Deleted)
Anesthesia Post Note  Patient: Angel [name]  Procedure(s) Performed: AN AD HOC LABOR EPIDURAL     Anesthesia Type: Epidural Anesthetic complications: no   No notable events documented.  Last Vitals:  Vitals:   03/15/22 0950 03/15/22 1103  BP: 126/69 108/70  Pulse: 61 77  Resp: 18 17  Temp: 36.5 C 36.6 C  SpO2:  100%    Last Pain:  Vitals:   03/15/22 0950  TempSrc: Oral  PainSc: 0-No pain   Pain Goal:                   [employer]

## 2022-03-15 NOTE — Procedures (Addendum)
Post-Placental IUD Insertion Procedure Note  For the postplacental IUD placement, discussed risks of postpartum expulsion, irregular bleeding, cramping, infection, malpositioning or misplacement of the IUD which may require further procedures. Also discussed >99% contraception efficacy, increased risk of ectopic pregnancy with failure of method.   Patient identified, informed consent signed prior to delivery, signed copy in chart, time out was performed.    Vaginal, labial and perineal areas thoroughly inspected for lacerations. No laceration identified.  Mirena  - IUD grasped between sterile gloved fingers. Fundus identified through abdominal wall using non-insertion hand. IUD inserted to fundus with bimanual technique. IUD carefully released at the fundus and insertion hand gently removed from vagina.    Strings trimmed to the level of the introitus. Patient tolerated procedure well.  Lot # [id number] Expiration Date 02/2024  Patient given post procedure instructions and IUD care card with expiration date.  Patient is asked to keep IUD strings tucked in her vagina until her postpartum follow up visit in 4-6 weeks. Patient advised to abstain from sexual intercourse and pulling on strings before her follow-up visit. Patient verbalized an understanding of the plan of care and agrees.

## 2022-03-15 NOTE — Progress Notes (Addendum)
LABOR PROGRESS NOTE  Angel [name] is a 31 y.o. [phone] at [redacted]w[redacted]d  admitted for IOL s/t A2GDM (insulin) and poly.   Subjective: No acute concerns  Objective: BP (!) 102/46   Pulse 73   Temp 98 F (36.7 C) (Oral)   Resp 16   Ht 5\' 9"  (1.753 m)   Wt (!) 148.7 kg   LMP 06/04/2021   SpO2 95%   BMI 48.41 kg/m  or  Vitals:   03/15/22 0400 03/15/22 0430 03/15/22 0500 03/15/22 0535  BP: 119/78 119/66 133/77 (!) 102/46  Pulse: 65 73 76 73  Resp:  16    Temp:  98 F (36.7 C)    TempSrc:  Oral    SpO2:      Weight:      Height:        SVE 8/100/-1,0  FHT: baseline rate 140, moderate varibility, +acel, recurrent variables and prolong decels with contractions Toco: every 2 minutes  Labs: Lab Results  Component Value Date   WBC 10.8 (H) 03/14/2022   HGB 9.3 (L) 03/14/2022   HCT 29.9 (L) 03/14/2022   MCV 75.7 (L) 03/14/2022   PLT 307 03/14/2022    Patient Active Problem List   Diagnosis Date Noted   GDM (gestational diabetes mellitus) 03/14/2022   Gestational diabetes    BMI 45.0-49.9, adult ([place]) 11/14/2021   Prediabetes 10/17/2021   Supervision of high risk pregnancy, antepartum 08/27/2021   Protein S deficiency affecting pregnancy, antepartum ([place]) 09/12/2019   History of pre-eclampsia in prior pregnancy, currently pregnant 09/12/2019   Maternal morbid obesity, antepartum ([place]) 09/12/2019   Vitamin D deficiency 02/12/2015   Panic attacks 02/09/2015   Anxiety state 02/09/2015   Frequent headaches 02/09/2015   Protein S deficiency ([place])    red chart,History of ischemic stroke x 2 in 2015 05/13/2014   Cerebral infarction ([place]) 05/13/2014   Anemia    Migraine 03/13/2014   Morbid obesity ([place]) 03/13/2014    Assessment / Plan: 31 y.o. G4P3003 at [redacted]w[redacted]d here for IOL s/t A2GDM and Poly   Labor: Multiple variables and prolonged decels. AROM. IUPC and FSE placed. Pit off. Fetal Wellbeing:  Cat II, improving Pain Control:  Epidural  Anticipated MOD:   SVD  Gerlene Fee, DO OB Fellow, [place] [facility] [place] 03/15/2022, 7:28 AM

## 2022-03-16 LAB — CBC
HCT: 26.6 % — ABNORMAL LOW (ref 36.0–46.0)
Hemoglobin: 8.3 g/dL — ABNORMAL LOW (ref 12.0–15.0)
MCH: 23.6 pg — ABNORMAL LOW (ref 26.0–34.0)
MCHC: 31.2 g/dL (ref 30.0–36.0)
MCV: 75.6 fL — ABNORMAL LOW (ref 80.0–100.0)
Platelets: 300 10*3/uL (ref 150–400)
RBC: 3.52 MIL/uL — ABNORMAL LOW (ref 3.87–5.11)
RDW: 17.3 % — ABNORMAL HIGH (ref 11.5–15.5)
WBC: 12.3 10*3/uL — ABNORMAL HIGH (ref 4.0–10.5)
nRBC: 0.2 % (ref 0.0–0.2)

## 2022-03-16 LAB — GLUCOSE, CAPILLARY: Glucose-Capillary: 133 mg/dL — ABNORMAL HIGH (ref 70–99)

## 2022-03-16 MED ORDER — INFLUENZA VAC SPLIT QUAD 0.5 ML IM SUSY
0.5000 mL | PREFILLED_SYRINGE | INTRAMUSCULAR | Status: AC
  Administered 2022-03-17: 0.5 mL via INTRAMUSCULAR
  Filled 2022-03-16: qty 0.5

## 2022-03-16 NOTE — Progress Notes (Signed)
MOB was referred for history of depression/anxiety. * Referral screened out by Clinical Social Worker because none of the following criteria appear to apply: ~ History of anxiety/depression during this pregnancy, or of post-partum depression following prior delivery. ~ Diagnosis of anxiety and/or depression within last 3 years. MOB's was last diagnosed with Anxiety state and Panic attacks 02/09/2015. No mental health concerns noted in MOB's prenatal care records during current pregnancy/delivery. MOB scored a 1 on EPDS during current admission.  OR * MOB's symptoms currently being treated with medication and/or therapy. Please contact the Clinical Social Worker if needs arise, by MOB request, or if MOB scores greater than 9/yes to question 10 on Edinburgh Postpartum Depression Screen.  Signed,  Kendal Ghazarian K. Kee Drudge, MSW, LCSWA, LCASA 03/16/2022 10:01 AM 

## 2022-03-16 NOTE — Lactation Note (Addendum)
This note was copied from a baby's chart. Lactation Consultation Note  Patient Name: Angel [name] [id number] Date: 03/16/2022 Reason for consult: Follow-up assessment;1st time breastfeeding;Early term 37-38.6wks;Maternal endocrine disorder GDM-insulin Age:32 hours  LC in to visit with P4 Mom of ET infant on day of possible discharge.  Baby has voided 3 times, but no stool noted.    On day of discharge, [place] asked Mom what her method of feeding her baby is.  She responded with breastfeeding.    Mom reports that baby latched well in L&D, but due to a low blood sugar, baby has received formula supplementation by bottle and hasn't latched since.  Mom has tried, but baby hasn't been successful.  This is what happened with her other children, but Mom didn't pump.    LC noted baby showing subtle cues while in crib.  Offered to assist with a latch and Mom agreeable.  Baby unable to open her mouth wide enough to sustain a latch more than just the end of the nipple.  Reassured Mom that some babies have to grow into being able to latch deeply and transfer milk from the breast.  Offered to set up a DEBP to support Mom's milk supply.    LC assisted with first pumping using 24 mm flanges and using initiation setting.  Encouraged pumping with each feeding if baby is unable to latch.  Explained the process of pumping and washing pump parts etc.  Mom has [place] and does not have a pump at home.  Faxed a [place] referral and explained how the [facility] loaner works.  Mom is interested if she is DC'd today.   Texted Elinor Dodge IBCLC to f/u with Mom at [facility].  Plan recommended- 1- Keep baby STS as much as possible 2- With cues, Mom to attempt to latch to the breast 3-If unable to sustain a deep latch, Mom will supplement per volume guideliens 4- Pump both breasts 15-20 mins.  Engorgement prevention and treatment reviewed.    LATCH Score Latch: Too sleepy or reluctant, no latch achieved, no sucking  elicited.  Audible Swallowing: None  Type of Nipple: Everted at rest and after stimulation  Comfort (Breast/Nipple): Soft / non-tender  Hold (Positioning): Full assist, staff holds infant at breast  LATCH Score: 4   Lactation Tools Discussed/Used Tools: Pump;Flanges;Bottle Flange Size: 24 Breast pump type: Double-Electric Breast Pump;Manual Pump Education: Setup, frequency, and cleaning;Milk Storage Reason for Pumping: Support milk supply/Difficult latch Pumping frequency: Encouraged pumping with every feeding or every 3 hrs  Interventions Interventions: Breast feeding basics reviewed;Assisted with latch;Skin to skin;Breast massage;Hand express;Adjust position;Support pillows;Position options;Expressed milk;Hand pump;DEBP;Education  Discharge Discharge Education: Engorgement and breast care;Warning signs for feeding baby;Outpatient recommendation (Texted Elinor Dodge) Pump: [facility] Loaner;Manual [facility] Program: Yes  Consult Status Consult Status: Follow-up Date: 03/16/22 Follow-up type: In-patient    Broadus John 03/16/2022, 12:59 PM

## 2022-03-16 NOTE — Progress Notes (Signed)
Post Partum Day 1 Subjective: Patient is doing well without complaints. She is voiding, tolerating a regular diet and ambulating. She denies feeling dizzy or lightheaded  Objective: Blood pressure 107/61, pulse 91, temperature 97.8 F (36.6 C), temperature source Oral, resp. rate 18, height 5\' 9"  (1.753 m), weight (!) 148.7 kg, last menstrual period 06/04/2021, SpO2 100 %, unknown if currently breastfeeding.  Physical Exam:  General: alert, cooperative, and no distress Lochia: appropriate Uterine Fundus: firm Incision: n/a DVT Evaluation: No evidence of DVT seen on physical exam.  Recent Labs    03/14/22 1156 03/16/22 0433  HGB 9.3* 8.3*  HCT 29.9* 26.6*    Assessment/Plan: Continue lovenox for h/o ischemic stroke x 2 Routine postpartum care Plan for discharge home tomorrow or later today if desired   LOS: 2 days   Mora Bellman, MD 03/16/2022, 12:26 PM

## 2022-03-17 ENCOUNTER — Ambulatory Visit: Payer: Self-pay

## 2022-03-17 ENCOUNTER — Other Ambulatory Visit: Payer: Self-pay

## 2022-03-17 MED ORDER — IBUPROFEN 600 MG PO TABS: 600.0000 mg | ORAL_TABLET | Freq: Four times a day (QID) | ORAL | 0 refills | Status: DC

## 2022-03-17 MED ORDER — FUROSEMIDE 20 MG PO TABS: 20.0000 mg | ORAL_TABLET | Freq: Every day | ORAL | 0 refills | Status: AC

## 2022-03-17 NOTE — Lactation Note (Signed)
This note was copied from a baby's chart. Lactation Consultation Note  Patient Name: Angel [name] [id number] Date: 03/17/2022 Reason for consult: Follow-up assessment Age:32 hours  Discussed the importance of pumping q 3hours if mother wants a full milk supply.  Sent stork paperwork.  Waiting for insurance decision.  Reviewed engorgement care and monitoring voids/stools.  Feeding Mother's Current Feeding Choice: Breast Milk and Formula  Lactation Tools Discussed/Used  DEBP  Interventions Interventions: Education;DEBP  Discharge Discharge Education: Engorgement and breast care;Warning signs for feeding baby Pump: [place] Pump (Sent paperwork/did not hear back prior to discharge)  Consult Status Consult Status: Complete Date: 03/17/22    [name] [facility] 03/17/2022, 2:17 PM

## 2022-03-18 ENCOUNTER — Encounter: Payer: Medicaid Other | Admitting: Family Medicine

## 2022-03-19 ENCOUNTER — Ambulatory Visit: Payer: Medicaid Other

## 2022-03-24 ENCOUNTER — Encounter: Payer: Self-pay | Admitting: Obstetrics & Gynecology

## 2022-03-26 ENCOUNTER — Encounter: Payer: Medicaid Other | Admitting: Obstetrics and Gynecology

## 2022-03-27 ENCOUNTER — Telehealth (HOSPITAL_COMMUNITY): Payer: Self-pay | Admitting: *Deleted

## 2022-03-27 ENCOUNTER — Encounter: Payer: Self-pay | Admitting: Obstetrics and Gynecology

## 2022-03-27 NOTE — Telephone Encounter (Signed)
Attempted Hospital Discharge Follow-Up Call.  No answer on patient phone.  Unable to leave a message.  

## 2022-03-31 ENCOUNTER — Other Ambulatory Visit: Payer: Self-pay | Admitting: *Deleted

## 2022-03-31 DIAGNOSIS — F53 Postpartum depression: Secondary | ICD-10-CM

## 2022-03-31 NOTE — Progress Notes (Signed)
Pt messaging via [place]. Expressing symptoms of PP Depression and Anxiety. Referral to [facility]. Advised previously of steps to take urgent concerns.

## 2022-04-01 ENCOUNTER — Encounter: Payer: Medicaid Other | Admitting: Obstetrics and Gynecology

## 2022-04-03 ENCOUNTER — Telehealth: Payer: Self-pay | Admitting: Licensed Clinical Social Worker

## 2022-04-24 DIAGNOSIS — F419 Anxiety disorder, unspecified: Secondary | ICD-10-CM | POA: Diagnosis not present

## 2022-04-24 DIAGNOSIS — E663 Overweight: Secondary | ICD-10-CM | POA: Diagnosis not present

## 2022-04-24 DIAGNOSIS — K219 Gastro-esophageal reflux disease without esophagitis: Secondary | ICD-10-CM | POA: Diagnosis not present

## 2022-04-27 ENCOUNTER — Telehealth: Payer: Medicaid Other | Admitting: Family

## 2022-04-27 DIAGNOSIS — K047 Periapical abscess without sinus: Secondary | ICD-10-CM | POA: Diagnosis not present

## 2022-04-27 MED ORDER — AMOXICILLIN-POT CLAVULANATE 875-125 MG PO TABS: 1.0000 | ORAL_TABLET | Freq: Two times a day (BID) | ORAL | 0 refills | Status: DC

## 2022-04-27 NOTE — Progress Notes (Signed)

## 2022-04-29 ENCOUNTER — Ambulatory Visit: Payer: Medicaid Other | Admitting: Obstetrics

## 2022-04-29 ENCOUNTER — Institutional Professional Consult (permissible substitution): Payer: Medicaid Other | Admitting: Licensed Clinical Social Worker

## 2022-05-15 DIAGNOSIS — F419 Anxiety disorder, unspecified: Secondary | ICD-10-CM | POA: Diagnosis not present

## 2022-05-15 DIAGNOSIS — E663 Overweight: Secondary | ICD-10-CM | POA: Diagnosis not present

## 2022-05-25 ENCOUNTER — Telehealth: Payer: Medicaid Other | Admitting: Family

## 2022-05-25 DIAGNOSIS — G8929 Other chronic pain: Secondary | ICD-10-CM

## 2022-05-25 NOTE — Progress Notes (Signed)
Because you were recently just treated for dental abscess , I feel your condition warrants further evaluation and I recommend that you be seen in a face to face visit.   NOTE: There will be NO CHARGE for this eVisit   If you are having a true medical emergency please call 911.      For an urgent face to face visit, [facility] has seven urgent care centers for your convenience:     [facility] Health Urgent Care Center at [facility] Directions [phone] [address] [place], [place] [address]    [facility] Health Urgent Care Center [facility]) Get Driving Directions [phone] [address] [place], [place] [address]  [facility] Health Urgent Care Center [facility]) Get Driving Directions [phone] [address] [place],  [place]  [address]  [facility] Health Urgent Care Center [facility] - at [employer] Directions  [phone] [place],  [place] [address]   [facility] Get Driving Directions [phone] [address] [place], [place] [address]   [facility] Health Urgent Care at [facility] Get Driving Directions  [phone] [address] [place], [place] [address]   [facility] Health Urgent Care at [facility] Directions [phone] [address] [place], [place] [address]  Your MyChart E-visit questionnaire answers were reviewed by a board certified advanced clinical practitioner to complete your personal care plan based on your specific symptoms.  Thank you for using e-Visits.

## 2022-06-19 DIAGNOSIS — F419 Anxiety disorder, unspecified: Secondary | ICD-10-CM | POA: Diagnosis not present

## 2022-06-19 DIAGNOSIS — K219 Gastro-esophageal reflux disease without esophagitis: Secondary | ICD-10-CM | POA: Diagnosis not present

## 2022-07-05 ENCOUNTER — Telehealth: Payer: Medicaid Other | Admitting: Nurse Practitioner

## 2022-07-05 DIAGNOSIS — J069 Acute upper respiratory infection, unspecified: Secondary | ICD-10-CM | POA: Diagnosis not present

## 2022-07-05 MED ORDER — BENZONATATE 100 MG PO CAPS: 100.0000 mg | ORAL_CAPSULE | Freq: Three times a day (TID) | ORAL | 0 refills | Status: DC | PRN

## 2022-07-05 MED ORDER — FLUTICASONE PROPIONATE 50 MCG/ACT NA SUSP: 2.0000 | Freq: Every day | NASAL | 6 refills | Status: DC

## 2022-07-05 NOTE — Progress Notes (Signed)

## 2022-07-24 ENCOUNTER — Telehealth: Payer: Medicaid Other | Admitting: Physician Assistant

## 2022-07-24 DIAGNOSIS — K047 Periapical abscess without sinus: Secondary | ICD-10-CM | POA: Diagnosis not present

## 2022-07-25 MED ORDER — NAPROXEN 500 MG PO TABS: 500.0000 mg | ORAL_TABLET | Freq: Two times a day (BID) | ORAL | 0 refills | Status: DC

## 2022-07-25 MED ORDER — PENICILLIN V POTASSIUM 500 MG PO TABS: 500.0000 mg | ORAL_TABLET | Freq: Three times a day (TID) | ORAL | 0 refills | Status: AC

## 2022-07-25 NOTE — Progress Notes (Signed)

## 2022-08-12 ENCOUNTER — Telehealth: Payer: Medicaid Other | Admitting: Family Medicine

## 2022-08-12 DIAGNOSIS — J069 Acute upper respiratory infection, unspecified: Secondary | ICD-10-CM

## 2022-08-12 NOTE — Progress Notes (Signed)
Duplicate

## 2022-08-12 NOTE — Progress Notes (Signed)
Because treatment for similar symptoms a month ago, in addition to dental infection/pain treatment in last month as we well, I feel your condition warrants further evaluation and I recommend that you be seen in a face to face visit.   NOTE: There will be NO CHARGE for this eVisit

## 2022-08-14 DIAGNOSIS — F419 Anxiety disorder, unspecified: Secondary | ICD-10-CM | POA: Diagnosis not present

## 2022-08-19 DIAGNOSIS — R635 Abnormal weight gain: Secondary | ICD-10-CM | POA: Diagnosis not present

## 2022-08-19 DIAGNOSIS — R519 Headache, unspecified: Secondary | ICD-10-CM | POA: Diagnosis not present

## 2022-08-19 DIAGNOSIS — K219 Gastro-esophageal reflux disease without esophagitis: Secondary | ICD-10-CM | POA: Diagnosis not present

## 2022-08-19 DIAGNOSIS — F419 Anxiety disorder, unspecified: Secondary | ICD-10-CM | POA: Diagnosis not present

## 2022-09-11 ENCOUNTER — Telehealth: Payer: Medicaid Other | Admitting: Physician Assistant

## 2022-09-11 DIAGNOSIS — R110 Nausea: Secondary | ICD-10-CM | POA: Diagnosis not present

## 2022-09-12 MED ORDER — ONDANSETRON 4 MG PO TBDP: 4.0000 mg | ORAL_TABLET | Freq: Three times a day (TID) | ORAL | 0 refills | Status: DC | PRN

## 2022-09-12 NOTE — Progress Notes (Signed)
I have spent 5 minutes in review of e-visit questionnaire, review and updating patient chart, medical decision making and response to patient.   Gricelda Foland Cody Jenner Rosier, PA-C    

## 2022-09-12 NOTE — Progress Notes (Signed)
E-Visit for Nausea and Vomiting   We are sorry that you are not feeling well. Here is how we plan to help!  Based on what you have shared with me it looks like you are having an intolerance to the Ozempic. Sometimes it takes a few doses for this to resolve, but can be experienced with dose changes. For now, I am giving you a script for zofran to help calm nausea down, but I want you to call your primary care provider to let them know so they can monitor, and if this continues, can discuss alternative medications.     HOME CARE: Drink clear liquids.  This is very important! Dehydration (the lack of fluid) can lead to a serious complication.  Start off with 1 tablespoon every 5 minutes for 8 hours. You may begin eating bland foods after 8 hours without vomiting.  Start with saltine crackers, white bread, rice, mashed potatoes, applesauce. After 48 hours on a bland diet, you may resume a normal diet. Try to go to sleep.  Sleep often empties the stomach and relieves the need to vomit.  GET HELP RIGHT AWAY IF:  Your symptoms do not improve or worsen within 2 days after treatment. You have a fever for over 3 days. You cannot keep down fluids after trying the medication.  MAKE SURE YOU:  Understand these instructions. Will watch your condition. Will get help right away if you are not doing well or get worse.    Thank you for choosing an e-visit.  Your e-visit answers were reviewed by a board certified advanced clinical practitioner to complete your personal care plan. Depending upon the condition, your plan could have included both over the counter or prescription medications.  Please review your pharmacy choice. Make sure the pharmacy is open so you can pick up prescription now. If there is a problem, you may contact your provider through [employer] and have the prescription routed to another pharmacy.  Your safety is important to [place]. If you have drug allergies check your prescription  carefully.   For the next 24 hours you can use MyChart to ask questions about today's visit, request a non-urgent call back, or ask for a work or school excuse. You will get an email in the next two days asking about your experience. I hope that your e-visit has been valuable and will speed your recovery.

## 2022-09-22 ENCOUNTER — Encounter (INDEPENDENT_AMBULATORY_CARE_PROVIDER_SITE_OTHER): Payer: Medicaid Other | Admitting: Family Medicine

## 2022-10-13 DIAGNOSIS — K219 Gastro-esophageal reflux disease without esophagitis: Secondary | ICD-10-CM | POA: Diagnosis not present

## 2022-10-13 DIAGNOSIS — F419 Anxiety disorder, unspecified: Secondary | ICD-10-CM | POA: Diagnosis not present

## 2022-10-27 ENCOUNTER — Encounter (INDEPENDENT_AMBULATORY_CARE_PROVIDER_SITE_OTHER): Payer: Medicaid Other | Admitting: Internal Medicine

## 2022-11-10 DIAGNOSIS — F419 Anxiety disorder, unspecified: Secondary | ICD-10-CM | POA: Diagnosis not present

## 2022-11-10 DIAGNOSIS — K219 Gastro-esophageal reflux disease without esophagitis: Secondary | ICD-10-CM | POA: Diagnosis not present

## 2022-11-22 ENCOUNTER — Telehealth: Payer: Medicaid Other | Admitting: Physician Assistant

## 2022-11-22 DIAGNOSIS — J069 Acute upper respiratory infection, unspecified: Secondary | ICD-10-CM | POA: Diagnosis not present

## 2022-11-22 MED ORDER — FLUTICASONE PROPIONATE 50 MCG/ACT NA SUSP: 2.0000 | Freq: Every day | NASAL | 6 refills | Status: AC

## 2022-11-22 MED ORDER — BENZONATATE 100 MG PO CAPS: 100.0000 mg | ORAL_CAPSULE | Freq: Three times a day (TID) | ORAL | 0 refills | Status: DC | PRN

## 2022-11-22 NOTE — Progress Notes (Signed)
E-Visit for Upper Respiratory Infection   We are sorry you are not feeling well.  Here is how we plan to help!  Based on what you have shared with me, it looks like you may have a viral upper respiratory infection.  Upper respiratory infections are caused by a large number of viruses; however, rhinovirus is the most common cause.   Symptoms vary from person to person, with common symptoms including sore throat, cough, fatigue or lack of energy and feeling of general discomfort.  A low-grade fever of up to 100.4 may present, but is often uncommon.  Symptoms vary however, and are closely related to a person's age or underlying illnesses.  The most common symptoms associated with an upper respiratory infection are nasal discharge or congestion, cough, sneezing, headache and pressure in the ears and face.  These symptoms usually persist for about 3 to 10 days, but can last up to 2 weeks.  It is important to know that upper respiratory infections do not cause serious illness or complications in most cases.    Upper respiratory infections can be transmitted from person to person, with the most common method of transmission being a person's hands.  The virus is able to live on the skin and can infect other persons for up to 2 hours after direct contact.  Also, these can be transmitted when someone coughs or sneezes; thus, it is important to cover the mouth to reduce this risk.  To keep the spread of the illness at bay, good hand hygiene is very important.  This is an infection that is most likely caused by a virus. There are no specific treatments other than to help you with the symptoms until the infection runs its course.  We are sorry you are not feeling well.  Here is how we plan to help!   For nasal congestion, you may use an oral decongestants such as Mucinex D or if you have glaucoma or high blood pressure use plain Mucinex.  Saline nasal spray or nasal drops can help and can safely be used as often as  needed for congestion.  For your congestion, I have prescribed Fluticasone nasal spray one spray in each nostril twice a day  If you do not have a history of heart disease, hypertension, diabetes or thyroid disease, prostate/bladder issues or glaucoma, you may also use Sudafed to treat nasal congestion.  It is highly recommended that you consult with a pharmacist or your primary care physician to ensure this medication is safe for you to take.     If you have a cough, you may use cough suppressants such as Delsym and Robitussin.  If you have glaucoma or high blood pressure, you can also use Coricidin HBP.   For cough I have prescribed for you A prescription cough medication called Tessalon Perles 100 mg. You may take 1-2 capsules every 8 hours as needed for cough  If you have a sore or scratchy throat, use a saltwater gargle-  to  teaspoon of salt dissolved in a 4-ounce to 8-ounce glass of warm water.  Gargle the solution for approximately 15-30 seconds and then spit.  It is important not to swallow the solution.  You can also use throat lozenges/cough drops and Chloraseptic spray to help with throat pain or discomfort.  Warm or cold liquids can also be helpful in relieving throat pain.  For headache, pain or general discomfort, you can use Ibuprofen or Tylenol as directed.   Some authorities believe   that zinc sprays or the use of Echinacea may shorten the course of your symptoms.   HOME CARE Only take medications as instructed by your medical team. Be sure to drink plenty of fluids. Water is fine as well as fruit juices, sodas and electrolyte beverages. You may want to stay away from caffeine or alcohol. If you are nauseated, try taking small sips of liquids. How do you know if you are getting enough fluid? Your urine should be a pale yellow or almost colorless. Get rest. Taking a steamy shower or using a humidifier may help nasal congestion and ease sore throat pain. You can place a towel over  your head and breathe in the steam from hot water coming from a faucet. Using a saline nasal spray works much the same way. Cough drops, hard candies and sore throat lozenges may ease your cough. Avoid close contacts especially the very young and the elderly Cover your mouth if you cough or sneeze Always remember to wash your hands.   GET HELP RIGHT AWAY IF: You develop worsening fever. If your symptoms do not improve within 10 days You develop yellow or green discharge from your nose over 3 days. You have coughing fits You develop a severe head ache or visual changes. You develop shortness of breath, difficulty breathing or start having chest pain Your symptoms persist after you have completed your treatment plan  MAKE SURE YOU  Understand these instructions. Will watch your condition. Will get help right away if you are not doing well or get worse.  Thank you for choosing an e-visit.  Your e-visit answers were reviewed by a board certified advanced clinical practitioner to complete your personal care plan. Depending upon the condition, your plan could have included both over the counter or prescription medications.  Please review your pharmacy choice. Make sure the pharmacy is open so you can pick up prescription now. If there is a problem, you may contact your provider through MyChart messaging and have the prescription routed to another pharmacy.  Your safety is important to us. If you have drug allergies check your prescription carefully.   For the next 24 hours you can use MyChart to ask questions about today's visit, request a non-urgent call back, or ask for a work or school excuse. You will get an email in the next two days asking about your experience. I hope that your e-visit has been valuable and will speed your recovery.    I have spent 5 minutes in review of e-visit questionnaire, review and updating patient chart, medical decision making and response to patient.    Angeles Paolucci Z Ward, PA-C    

## 2022-11-30 ENCOUNTER — Telehealth: Payer: Medicaid Other | Admitting: Family

## 2022-11-30 DIAGNOSIS — K0889 Other specified disorders of teeth and supporting structures: Secondary | ICD-10-CM | POA: Diagnosis not present

## 2022-11-30 DIAGNOSIS — K047 Periapical abscess without sinus: Secondary | ICD-10-CM

## 2022-11-30 MED ORDER — AMOXICILLIN-POT CLAVULANATE 875-125 MG PO TABS: 1.0000 | ORAL_TABLET | Freq: Two times a day (BID) | ORAL | 0 refills | Status: DC

## 2022-11-30 NOTE — Progress Notes (Signed)

## 2022-12-10 DIAGNOSIS — F419 Anxiety disorder, unspecified: Secondary | ICD-10-CM | POA: Diagnosis not present

## 2022-12-10 DIAGNOSIS — K219 Gastro-esophageal reflux disease without esophagitis: Secondary | ICD-10-CM | POA: Diagnosis not present

## 2022-12-22 ENCOUNTER — Telehealth: Payer: Medicaid Other | Admitting: Physician Assistant

## 2022-12-22 DIAGNOSIS — R3989 Other symptoms and signs involving the genitourinary system: Secondary | ICD-10-CM | POA: Diagnosis not present

## 2022-12-22 MED ORDER — NITROFURANTOIN MONOHYD MACRO 100 MG PO CAPS: 100.0000 mg | ORAL_CAPSULE | Freq: Two times a day (BID) | ORAL | 0 refills | Status: DC

## 2022-12-22 NOTE — Progress Notes (Signed)

## 2023-01-02 ENCOUNTER — Telehealth: Payer: Medicaid Other | Admitting: Physician Assistant

## 2023-01-02 DIAGNOSIS — N390 Urinary tract infection, site not specified: Secondary | ICD-10-CM

## 2023-01-02 NOTE — Progress Notes (Signed)
Because you are having recurrent symptoms after completing an antibiotic, I feel your condition warrants further evaluation and I recommend that you be seen in a face to face visit for further testing and evaluation.   NOTE: There will be NO CHARGE for this eVisit   If you are having a true medical emergency please call 911.      For an urgent face to face visit, [facility] has eight urgent care centers for your convenience:   NEW!! [facility] Health Urgent Care Center at [facility] Get Driving Directions [phone] [address] [place], [address]    [facility] Health Urgent Care Center at [facility] Get Driving Directions [phone] [address] [place], [place] [address]   [facility] Health Urgent Care Center [facility]) Get Driving Directions [phone] [address] [place], [place] [address]  [facility] Health Urgent Care Center [facility]) Get Driving Directions [phone] [address] [place],  [place]  [address]  [facility] Health Urgent Care Center [facility] - at [employer]  [phone] [address] [place],  [place] [address]   [facility] Health Urgent Care at [facility] Get Driving Directions [phone] [address] [place], [place] [address]   [facility] Health Urgent Care at [facility] Get Driving Directions  [phone] [address] [place], [place] [address]   [facility] Health Urgent Care at [facility] Directions [phone] [address] [place], [place] [address]  Your MyChart E-visit questionnaire answers were reviewed by a board certified advanced clinical practitioner to complete your personal care plan based on your specific symptoms.  Thank you for using e-Visits.    I have spent 5 minutes in review of e-visit questionnaire, review and updating patient chart, medical decision making and response  to patient.   Margaretann Loveless, PA-C

## 2023-01-09 DIAGNOSIS — E119 Type 2 diabetes mellitus without complications: Secondary | ICD-10-CM | POA: Diagnosis not present

## 2023-01-09 DIAGNOSIS — F419 Anxiety disorder, unspecified: Secondary | ICD-10-CM | POA: Diagnosis not present

## 2023-01-09 DIAGNOSIS — K219 Gastro-esophageal reflux disease without esophagitis: Secondary | ICD-10-CM | POA: Diagnosis not present

## 2023-02-11 DIAGNOSIS — E119 Type 2 diabetes mellitus without complications: Secondary | ICD-10-CM | POA: Diagnosis not present

## 2023-02-11 DIAGNOSIS — F419 Anxiety disorder, unspecified: Secondary | ICD-10-CM | POA: Diagnosis not present

## 2023-02-11 DIAGNOSIS — K219 Gastro-esophageal reflux disease without esophagitis: Secondary | ICD-10-CM | POA: Diagnosis not present

## 2023-03-12 DIAGNOSIS — F419 Anxiety disorder, unspecified: Secondary | ICD-10-CM | POA: Diagnosis not present

## 2023-03-12 DIAGNOSIS — K219 Gastro-esophageal reflux disease without esophagitis: Secondary | ICD-10-CM | POA: Diagnosis not present

## 2023-03-12 DIAGNOSIS — E119 Type 2 diabetes mellitus without complications: Secondary | ICD-10-CM | POA: Diagnosis not present

## 2023-04-15 DIAGNOSIS — F419 Anxiety disorder, unspecified: Secondary | ICD-10-CM | POA: Diagnosis not present

## 2023-04-15 DIAGNOSIS — K219 Gastro-esophageal reflux disease without esophagitis: Secondary | ICD-10-CM | POA: Diagnosis not present

## 2023-04-15 DIAGNOSIS — E119 Type 2 diabetes mellitus without complications: Secondary | ICD-10-CM | POA: Diagnosis not present

## 2023-05-09 ENCOUNTER — Telehealth: Payer: Medicaid Other | Admitting: Physician Assistant

## 2023-05-09 DIAGNOSIS — L309 Dermatitis, unspecified: Secondary | ICD-10-CM | POA: Diagnosis not present

## 2023-05-09 MED ORDER — PREDNISONE 20 MG PO TABS: 20.0000 mg | ORAL_TABLET | Freq: Every day | ORAL | 0 refills | Status: AC

## 2023-05-09 MED ORDER — TRIAMCINOLONE ACETONIDE 0.1 % EX CREA: 1.0000 | TOPICAL_CREAM | Freq: Two times a day (BID) | CUTANEOUS | 0 refills | Status: DC

## 2023-05-09 NOTE — Progress Notes (Signed)
I have spent 5 minutes in review of e-visit questionnaire, review and updating patient chart, medical decision making and response to patient.   Laure Kidney, PA-C

## 2023-05-09 NOTE — Progress Notes (Signed)

## 2023-05-13 DIAGNOSIS — R519 Headache, unspecified: Secondary | ICD-10-CM | POA: Diagnosis not present

## 2023-05-13 DIAGNOSIS — F419 Anxiety disorder, unspecified: Secondary | ICD-10-CM | POA: Diagnosis not present

## 2023-05-13 DIAGNOSIS — E119 Type 2 diabetes mellitus without complications: Secondary | ICD-10-CM | POA: Diagnosis not present

## 2023-05-29 ENCOUNTER — Telehealth: Payer: Medicaid Other | Admitting: Nurse Practitioner

## 2023-05-29 DIAGNOSIS — M545 Low back pain, unspecified: Secondary | ICD-10-CM

## 2023-05-29 MED ORDER — CYCLOBENZAPRINE HCL 10 MG PO TABS: 10.0000 mg | ORAL_TABLET | Freq: Three times a day (TID) | ORAL | 0 refills | Status: AC | PRN

## 2023-05-29 MED ORDER — NAPROXEN 500 MG PO TABS: 500.0000 mg | ORAL_TABLET | Freq: Two times a day (BID) | ORAL | 0 refills | Status: DC

## 2023-05-29 NOTE — Progress Notes (Signed)
 E-Visit for Back Pain   We are sorry that you are not feeling well.  Here is how we plan to help!  Based on what you have shared with me it looks like you mostly have acute back pain.  Acute back pain is defined as musculoskeletal pain that can resolve in 1-3 weeks with conservative treatment.  I have prescribed Naprosyn  500 mg take one by mouth twice a day non-steroid anti-inflammatory (NSAID) as well as Flexeril  10 mg every eight hours as needed which is a muscle relaxer  Some patients experience stomach irritation or in increased heartburn with anti-inflammatory drugs.  Please keep in mind that muscle relaxer's can cause fatigue and should not be taken while at work or driving.  Back pain is very common.  The pain often gets better over time.  The cause of back pain is usually not dangerous.  Most people can learn to manage their back pain on their own.  Home Care Stay active.  Start with short walks on flat ground if you can.  Try to walk farther each day. Do not sit, drive or stand in one place for more than 30 minutes.  Do not stay in bed. Do not avoid exercise or work.  Activity can help your back heal faster. Be careful when you bend or lift an object.  Bend at your knees, keep the object close to you, and do not twist. Sleep on a firm mattress.  Lie on your side, and bend your knees.  If you lie on your back, put a pillow under your knees. Only take medicines as told by your doctor. Put ice on the injured area. Put ice in a plastic bag Place a towel between your skin and the bag Leave the ice on for 15-20 minutes, 3-4 times a day for the first 2-3 days. 210 After that, you can switch between ice and heat packs. Ask your doctor about back exercises or massage. Avoid feeling anxious or stressed.  Find good ways to deal with stress, such as exercise.  Get Help Right Way If: Your pain does not go away with rest or medicine. Your pain does not go away in 1 week. You have new  problems. You do not feel well. The pain spreads into your legs. You cannot control when you poop (bowel movement) or pee (urinate) You feel sick to your stomach (nauseous) or throw up (vomit) You have belly (abdominal) pain. You feel like you may pass out (faint). If you develop a fever.  Make Sure you: Understand these instructions. Will watch your condition Will get help right away if you are not doing well or get worse.  Your e-visit answers were reviewed by a board certified advanced clinical practitioner to complete your personal care plan.  Depending on the condition, your plan could have included both over the counter or prescription medications.  If there is a problem please reply  once you have received a response from your provider.  Your safety is important to us .  If you have drug allergies check your prescription carefully.    You can use MyChart to ask questions about today's visit, request a non-urgent call back, or ask for a work or school excuse for 24 hours related to this e-Visit. If it has been greater than 24 hours you will need to follow up with your provider, or enter a new e-Visit to address those concerns.  You will get an e-mail in the next two days asking about  your experience.  I hope that your e-visit has been valuable and will speed your recovery. Thank you for using e-visits.  Meds ordered this encounter  Medications   naproxen  (NAPROSYN ) 500 MG tablet    Sig: Take 1 tablet (500 mg total) by mouth 2 (two) times daily with a meal.    Dispense:  60 tablet    Refill:  0   cyclobenzaprine  (FLEXERIL ) 10 MG tablet    Sig: Take 1 tablet (10 mg total) by mouth 3 (three) times daily as needed for muscle spasms.    Dispense:  30 tablet    Refill:  0     I spent approximately 5 minutes reviewing the patient's history, current symptoms and coordinating their care today.

## 2023-06-17 ENCOUNTER — Telehealth: Payer: Medicaid Other | Admitting: Physician Assistant

## 2023-06-17 DIAGNOSIS — R6889 Other general symptoms and signs: Secondary | ICD-10-CM | POA: Diagnosis not present

## 2023-06-17 MED ORDER — OSELTAMIVIR PHOSPHATE 75 MG PO CAPS: 75.0000 mg | ORAL_CAPSULE | Freq: Two times a day (BID) | ORAL | 0 refills | Status: AC

## 2023-06-17 NOTE — Progress Notes (Signed)
E visit for Flu like symptoms   We are sorry that you are not feeling well.  Here is how we plan to help! Based on what you have shared with me it looks like you may have a respiratory virus that may be influenza.  Influenza or "the flu" is   an infection caused by a respiratory virus. The flu virus is highly contagious and persons who did not receive their yearly flu vaccination may "catch" the flu from close contact.  We have anti-viral medications to treat the viruses that cause this infection. They are not a "cure" and only shorten the course of the infection. These prescriptions are most effective when they are given within the first 2 days of "flu" symptoms. Antiviral medication are indicated if you have a high risk of complications from the flu. You should  also consider an antiviral medication if you are in close contact with someone who is at risk. These medications can help patients avoid complications from the flu  but have side effects that you should know. Possible side effects from Tamiflu or oseltamivir include nausea, vomiting, diarrhea, dizziness, headaches, eye redness, sleep problems or other respiratory symptoms. You should not take Tamiflu if you have an allergy to oseltamivir or any to the ingredients in Tamiflu.  Based upon your symptoms and potential risk factors I have prescribed Oseltamivir (Tamiflu).  It has been sent to your designated pharmacy.  You will take one 75 mg capsule orally twice a day for the next 5 days.  ANYONE WHO HAS FLU SYMPTOMS SHOULD: Stay home. The flu is highly contagious and going out or to work exposes others! Be sure to drink plenty of fluids. Water is fine as well as fruit juices, sodas and electrolyte beverages. You may want to stay away from caffeine or alcohol. If you are nauseated, try taking small sips of liquids. How do you know if you are getting enough fluid? Your urine should be a pale yellow or almost colorless. Get rest. Taking a steamy  shower or using a humidifier may help nasal congestion and ease sore throat pain. Using a saline nasal spray works much the same way. Cough drops, hard candies and sore throat lozenges may ease your cough. Line up a caregiver. Have someone check on you regularly.   GET HELP RIGHT AWAY IF: You cannot keep down liquids or your medications. You become short of breath Your fell like you are going to pass out or loose consciousness. Your symptoms persist after you have completed your treatment plan MAKE SURE YOU  Understand these instructions. Will watch your condition. Will get help right away if you are not doing well or get worse.  Your e-visit answers were reviewed by a board certified advanced clinical practitioner to complete your personal care plan.  Depending on the condition, your plan could have included both over the counter or prescription medications.  If there is a problem please reply  once you have received a response from your provider.  Your safety is important to [place].  If you have drug allergies check your prescription carefully.    You can use MyChart to ask questions about today's visit, request a non-urgent call back, or ask for a work or school excuse for 24 hours related to this e-Visit. If it has been greater than 24 hours you will need to follow up with your provider, or enter a new e-Visit to address those concerns.  You will get an e-mail in the next  two days asking about your experience.  I hope that your e-visit has been valuable and will speed your recovery. Thank you for using e-visits.   I have spent 5 minutes in review of e-visit questionnaire, review and updating patient chart, medical decision making and response to patient.   Gilberto Better, PA-C  '

## 2023-08-12 DIAGNOSIS — K219 Gastro-esophageal reflux disease without esophagitis: Secondary | ICD-10-CM | POA: Diagnosis not present

## 2023-08-12 DIAGNOSIS — F419 Anxiety disorder, unspecified: Secondary | ICD-10-CM | POA: Diagnosis not present

## 2023-08-12 DIAGNOSIS — E119 Type 2 diabetes mellitus without complications: Secondary | ICD-10-CM | POA: Diagnosis not present

## 2023-09-09 DIAGNOSIS — F411 Generalized anxiety disorder: Secondary | ICD-10-CM | POA: Diagnosis not present

## 2023-09-11 ENCOUNTER — Telehealth: Admitting: Family Medicine

## 2023-09-11 DIAGNOSIS — B9689 Other specified bacterial agents as the cause of diseases classified elsewhere: Secondary | ICD-10-CM | POA: Diagnosis not present

## 2023-09-11 DIAGNOSIS — N760 Acute vaginitis: Secondary | ICD-10-CM

## 2023-09-11 MED ORDER — METRONIDAZOLE 500 MG PO TABS: 500.0000 mg | ORAL_TABLET | Freq: Two times a day (BID) | ORAL | 0 refills | Status: AC

## 2023-09-11 NOTE — Progress Notes (Signed)

## 2023-09-24 ENCOUNTER — Telehealth: Admitting: Physician Assistant

## 2023-09-24 DIAGNOSIS — N898 Other specified noninflammatory disorders of vagina: Secondary | ICD-10-CM

## 2023-09-24 DIAGNOSIS — R109 Unspecified abdominal pain: Secondary | ICD-10-CM

## 2023-09-24 NOTE — Progress Notes (Signed)
  Because of low back and belly pain along with the continued discharge after recent treatment via e-visit/virtual urgent care, I feel your condition warrants further evaluation and I recommend that you be seen in a face-to-face visit.   NOTE: There will be NO CHARGE for this E-Visit   If you are having a true medical emergency, please call 911.     For an urgent face to face visit, [facility] has multiple urgent care centers for your convenience.  Click the link below for the full list of locations and hours, walk-in wait times, appointment scheduling options and driving directions:  Urgent Care - [place], [place], [place], [place], [place], [place]       Your MyChart E-visit questionnaire answers were reviewed by a board certified advanced clinical practitioner to complete your personal care plan based on your specific symptoms.    Thank you for using e-Visits.

## 2023-09-25 ENCOUNTER — Telehealth: Admitting: Family Medicine

## 2023-09-25 DIAGNOSIS — B3731 Acute candidiasis of vulva and vagina: Secondary | ICD-10-CM

## 2023-09-25 MED ORDER — FLUCONAZOLE 150 MG PO TABS: 150.0000 mg | ORAL_TABLET | Freq: Every day | ORAL | 0 refills | Status: AC

## 2023-09-25 NOTE — Progress Notes (Signed)

## 2023-10-14 DIAGNOSIS — E119 Type 2 diabetes mellitus without complications: Secondary | ICD-10-CM | POA: Diagnosis not present

## 2023-10-14 DIAGNOSIS — E782 Mixed hyperlipidemia: Secondary | ICD-10-CM | POA: Diagnosis not present

## 2023-10-14 DIAGNOSIS — F321 Major depressive disorder, single episode, moderate: Secondary | ICD-10-CM | POA: Diagnosis not present

## 2023-10-14 DIAGNOSIS — K219 Gastro-esophageal reflux disease without esophagitis: Secondary | ICD-10-CM | POA: Diagnosis not present

## 2023-10-14 DIAGNOSIS — F419 Anxiety disorder, unspecified: Secondary | ICD-10-CM | POA: Diagnosis not present

## 2023-11-08 DIAGNOSIS — F411 Generalized anxiety disorder: Secondary | ICD-10-CM | POA: Diagnosis not present

## 2023-11-15 DIAGNOSIS — F411 Generalized anxiety disorder: Secondary | ICD-10-CM | POA: Diagnosis not present

## 2023-11-22 DIAGNOSIS — F411 Generalized anxiety disorder: Secondary | ICD-10-CM | POA: Diagnosis not present

## 2023-11-29 ENCOUNTER — Encounter

## 2023-11-29 DIAGNOSIS — F411 Generalized anxiety disorder: Secondary | ICD-10-CM | POA: Diagnosis not present

## 2023-12-06 DIAGNOSIS — F411 Generalized anxiety disorder: Secondary | ICD-10-CM | POA: Diagnosis not present

## 2023-12-13 DIAGNOSIS — F411 Generalized anxiety disorder: Secondary | ICD-10-CM | POA: Diagnosis not present

## 2023-12-16 ENCOUNTER — Emergency Department (HOSPITAL_COMMUNITY)

## 2023-12-16 ENCOUNTER — Inpatient Hospital Stay (HOSPITAL_COMMUNITY)

## 2023-12-16 ENCOUNTER — Inpatient Hospital Stay (HOSPITAL_COMMUNITY)
Admission: EM | Admit: 2023-12-16 | Discharge: 2023-12-18 | DRG: 065 | Disposition: A | Discharge status: Home / Self Care | Attending: Neurology | Admitting: Neurology

## 2023-12-16 ENCOUNTER — Encounter (HOSPITAL_COMMUNITY): Payer: Self-pay

## 2023-12-16 ENCOUNTER — Other Ambulatory Visit: Payer: Self-pay

## 2023-12-16 DIAGNOSIS — R29704 NIHSS score 4: Secondary | ICD-10-CM | POA: Diagnosis present

## 2023-12-16 DIAGNOSIS — I63511 Cerebral infarction due to unspecified occlusion or stenosis of right middle cerebral artery: Secondary | ICD-10-CM | POA: Diagnosis not present

## 2023-12-16 DIAGNOSIS — R930 Abnormal findings on diagnostic imaging of skull and head, not elsewhere classified: Secondary | ICD-10-CM | POA: Diagnosis not present

## 2023-12-16 DIAGNOSIS — I340 Nonrheumatic mitral (valve) insufficiency: Secondary | ICD-10-CM | POA: Diagnosis not present

## 2023-12-16 DIAGNOSIS — R29898 Other symptoms and signs involving the musculoskeletal system: Principal | ICD-10-CM

## 2023-12-16 DIAGNOSIS — Z8673 Personal history of transient ischemic attack (TIA), and cerebral infarction without residual deficits: Secondary | ICD-10-CM | POA: Diagnosis not present

## 2023-12-16 DIAGNOSIS — F410 Panic disorder [episodic paroxysmal anxiety] without agoraphobia: Secondary | ICD-10-CM | POA: Diagnosis present

## 2023-12-16 DIAGNOSIS — Z87891 Personal history of nicotine dependence: Secondary | ICD-10-CM

## 2023-12-16 DIAGNOSIS — I6389 Other cerebral infarction: Secondary | ICD-10-CM

## 2023-12-16 DIAGNOSIS — R519 Headache, unspecified: Secondary | ICD-10-CM | POA: Diagnosis not present

## 2023-12-16 DIAGNOSIS — D6859 Other primary thrombophilia: Secondary | ICD-10-CM | POA: Diagnosis not present

## 2023-12-16 DIAGNOSIS — Z833 Family history of diabetes mellitus: Secondary | ICD-10-CM

## 2023-12-16 DIAGNOSIS — I639 Cerebral infarction, unspecified: Secondary | ICD-10-CM

## 2023-12-16 DIAGNOSIS — R42 Dizziness and giddiness: Secondary | ICD-10-CM | POA: Diagnosis not present

## 2023-12-16 DIAGNOSIS — Z7902 Long term (current) use of antithrombotics/antiplatelets: Secondary | ICD-10-CM

## 2023-12-16 DIAGNOSIS — R590 Localized enlarged lymph nodes: Secondary | ICD-10-CM | POA: Diagnosis not present

## 2023-12-16 DIAGNOSIS — E119 Type 2 diabetes mellitus without complications: Secondary | ICD-10-CM | POA: Diagnosis present

## 2023-12-16 DIAGNOSIS — Z7982 Long term (current) use of aspirin: Secondary | ICD-10-CM | POA: Diagnosis not present

## 2023-12-16 DIAGNOSIS — K219 Gastro-esophageal reflux disease without esophagitis: Secondary | ICD-10-CM | POA: Diagnosis present

## 2023-12-16 DIAGNOSIS — I10 Essential (primary) hypertension: Secondary | ICD-10-CM | POA: Diagnosis present

## 2023-12-16 DIAGNOSIS — G4733 Obstructive sleep apnea (adult) (pediatric): Secondary | ICD-10-CM | POA: Diagnosis not present

## 2023-12-16 DIAGNOSIS — Z6837 Body mass index (BMI) 37.0-37.9, adult: Secondary | ICD-10-CM | POA: Diagnosis not present

## 2023-12-16 DIAGNOSIS — E538 Deficiency of other specified B group vitamins: Secondary | ICD-10-CM | POA: Diagnosis present

## 2023-12-16 DIAGNOSIS — G936 Cerebral edema: Secondary | ICD-10-CM | POA: Diagnosis not present

## 2023-12-16 DIAGNOSIS — Z8249 Family history of ischemic heart disease and other diseases of the circulatory system: Secondary | ICD-10-CM | POA: Diagnosis not present

## 2023-12-16 DIAGNOSIS — E785 Hyperlipidemia, unspecified: Secondary | ICD-10-CM | POA: Diagnosis present

## 2023-12-16 DIAGNOSIS — R414 Neurologic neglect syndrome: Secondary | ICD-10-CM | POA: Diagnosis present

## 2023-12-16 DIAGNOSIS — R29702 NIHSS score 2: Secondary | ICD-10-CM | POA: Diagnosis not present

## 2023-12-16 DIAGNOSIS — R2689 Other abnormalities of gait and mobility: Secondary | ICD-10-CM | POA: Diagnosis not present

## 2023-12-16 DIAGNOSIS — Z79899 Other long term (current) drug therapy: Secondary | ICD-10-CM

## 2023-12-16 DIAGNOSIS — Z825 Family history of asthma and other chronic lower respiratory diseases: Secondary | ICD-10-CM

## 2023-12-16 DIAGNOSIS — E669 Obesity, unspecified: Secondary | ICD-10-CM | POA: Diagnosis not present

## 2023-12-16 DIAGNOSIS — F419 Anxiety disorder, unspecified: Secondary | ICD-10-CM | POA: Diagnosis not present

## 2023-12-16 DIAGNOSIS — R531 Weakness: Secondary | ICD-10-CM | POA: Diagnosis not present

## 2023-12-16 DIAGNOSIS — F321 Major depressive disorder, single episode, moderate: Secondary | ICD-10-CM | POA: Diagnosis not present

## 2023-12-16 DIAGNOSIS — E782 Mixed hyperlipidemia: Secondary | ICD-10-CM | POA: Diagnosis not present

## 2023-12-16 DIAGNOSIS — E663 Overweight: Secondary | ICD-10-CM | POA: Diagnosis not present

## 2023-12-16 LAB — VITAMIN B12: Vitamin B-12: 168 pg/mL — ABNORMAL LOW (ref 180–914)

## 2023-12-16 LAB — CBC WITH DIFFERENTIAL/PLATELET
Abs Immature Granulocytes: 0.01 K/uL (ref 0.00–0.07)
Basophils Absolute: 0 K/uL (ref 0.0–0.1)
Basophils Relative: 0 %
Eosinophils Absolute: 0 K/uL (ref 0.0–0.5)
Eosinophils Relative: 0 %
HCT: 37.1 % (ref 36.0–46.0)
Hemoglobin: 11.8 g/dL — ABNORMAL LOW (ref 12.0–15.0)
Immature Granulocytes: 0 %
Lymphocytes Relative: 38 %
Lymphs Abs: 3 K/uL (ref 0.7–4.0)
MCH: 28 pg (ref 26.0–34.0)
MCHC: 31.8 g/dL (ref 30.0–36.0)
MCV: 87.9 fL (ref 80.0–100.0)
Monocytes Absolute: 0.3 K/uL (ref 0.1–1.0)
Monocytes Relative: 4 %
Neutro Abs: 4.6 K/uL (ref 1.7–7.7)
Neutrophils Relative %: 58 %
Platelets: 359 K/uL (ref 150–400)
RBC: 4.22 MIL/uL (ref 3.87–5.11)
RDW: 16.5 % — ABNORMAL HIGH (ref 11.5–15.5)
WBC: 7.9 K/uL (ref 4.0–10.5)
nRBC: 0 % (ref 0.0–0.2)

## 2023-12-16 LAB — HEMOGLOBIN A1C
Hgb A1c MFr Bld: 5.6 % (ref 4.8–5.6)
Mean Plasma Glucose: 114.02 mg/dL

## 2023-12-16 LAB — COMPREHENSIVE METABOLIC PANEL WITH GFR
ALT: 15 U/L (ref 0–44)
AST: 15 U/L (ref 15–41)
Albumin: 3.6 g/dL (ref 3.5–5.0)
Alkaline Phosphatase: 76 U/L (ref 38–126)
Anion gap: 9 (ref 5–15)
BUN: 9 mg/dL (ref 6–20)
CO2: 23 mmol/L (ref 22–32)
Calcium: 9.2 mg/dL (ref 8.9–10.3)
Chloride: 107 mmol/L (ref 98–111)
Creatinine, Ser: 0.55 mg/dL (ref 0.44–1.00)
GFR, Estimated: >60 mL/min (ref >60)
Glucose, Bld: 117 mg/dL — ABNORMAL HIGH (ref 70–99)
Potassium: 3.6 mmol/L (ref 3.5–5.1)
Sodium: 139 mmol/L (ref 135–145)
Total Bilirubin: 0.7 mg/dL (ref 0.0–1.2)
Total Protein: 7.9 g/dL (ref 6.5–8.1)

## 2023-12-16 LAB — ECHOCARDIOGRAM COMPLETE
AR max vel: 2.41 cm2
AV Peak grad: 10.1 mmHg
Ao pk vel: 1.59 m/s
Area-P 1/2: 2.8 cm2
Height: 69 in
S' Lateral: 3.1 cm
Weight: 4064 [oz_av]

## 2023-12-16 LAB — CBG MONITORING, ED: Glucose-Capillary: 124 mg/dL — ABNORMAL HIGH (ref 70–99)

## 2023-12-16 LAB — HCG, SERUM, QUALITATIVE: Preg, Serum: NEGATIVE

## 2023-12-16 LAB — PROTIME-INR
INR: 1 (ref 0.8–1.2)
Prothrombin Time: 14 s (ref 11.4–15.2)

## 2023-12-16 LAB — FOLATE: Folate: 6.8 ng/mL (ref >5.9)

## 2023-12-16 LAB — MRSA NEXT GEN BY PCR, NASAL: MRSA by PCR Next Gen: NOT DETECTED

## 2023-12-16 LAB — TSH: TSH: 1.921 u[IU]/mL (ref 0.350–4.500)

## 2023-12-16 LAB — HIV ANTIBODY (ROUTINE TESTING W REFLEX): HIV Screen 4th Generation wRfx: NONREACTIVE

## 2023-12-16 LAB — ANTITHROMBIN III: AntiThromb III Func: 105 % (ref 75–120)

## 2023-12-16 MED ORDER — CLOPIDOGREL BISULFATE 75 MG PO TABS
75.0000 mg | ORAL_TABLET | Freq: Every day | ORAL | Status: DC
  Administered 2023-12-17 – 2023-12-18 (×2): 75 mg via ORAL
  Filled 2023-12-16 (×2): qty 1

## 2023-12-16 MED ORDER — IOHEXOL 350 MG/ML SOLN
100.0000 mL | Freq: Once | INTRAVENOUS | Status: AC | PRN
  Administered 2023-12-16: 100 mL via INTRAVENOUS

## 2023-12-16 MED ORDER — CHLORHEXIDINE GLUCONATE CLOTH 2 % EX PADS
6.0000 | MEDICATED_PAD | Freq: Every day | CUTANEOUS | Status: DC
  Administered 2023-12-16 – 2023-12-18 (×3): 6 via TOPICAL

## 2023-12-16 MED ORDER — SODIUM CHLORIDE 0.9 % IV BOLUS
1000.0000 mL | Freq: Once | INTRAVENOUS | Status: AC
  Administered 2023-12-16: 1000 mL via INTRAVENOUS

## 2023-12-16 MED ORDER — STROKE: EARLY STAGES OF RECOVERY BOOK
Freq: Once | Status: AC
  Administered 2023-12-17: 1
  Filled 2023-12-16: qty 1

## 2023-12-16 MED ORDER — CLOPIDOGREL BISULFATE 300 MG PO TABS
300.0000 mg | ORAL_TABLET | Freq: Once | ORAL | Status: AC
  Administered 2023-12-16: 300 mg via ORAL
  Filled 2023-12-16 (×2): qty 1

## 2023-12-16 MED ORDER — DIPHENHYDRAMINE HCL 50 MG/ML IJ SOLN
25.0000 mg | Freq: Once | INTRAMUSCULAR | Status: AC
  Administered 2023-12-16: 25 mg via INTRAVENOUS
  Filled 2023-12-16: qty 1

## 2023-12-16 MED ORDER — ORAL CARE MOUTH RINSE
15.0000 mL | OROMUCOSAL | Status: DC | PRN
Start: 2023-12-16 — End: 2023-12-18

## 2023-12-16 MED ORDER — ASPIRIN 300 MG RE SUPP: 300.0000 mg | Freq: Once | RECTAL | Status: AC

## 2023-12-16 MED ORDER — ASPIRIN 325 MG PO TABS
325.0000 mg | ORAL_TABLET | Freq: Once | ORAL | Status: AC
  Administered 2023-12-16: 325 mg via ORAL
  Filled 2023-12-16: qty 1

## 2023-12-16 MED ORDER — PROCHLORPERAZINE EDISYLATE 10 MG/2ML IJ SOLN
10.0000 mg | Freq: Once | INTRAMUSCULAR | Status: AC
  Administered 2023-12-16: 10 mg via INTRAVENOUS
  Filled 2023-12-16: qty 2

## 2023-12-16 MED ORDER — SODIUM CHLORIDE 0.9 % IV SOLN: INTRAVENOUS | Status: DC

## 2023-12-16 MED ORDER — ASPIRIN 81 MG PO TBEC
81.0000 mg | DELAYED_RELEASE_TABLET | Freq: Every day | ORAL | Status: DC
  Administered 2023-12-17 – 2023-12-18 (×2): 81 mg via ORAL
  Filled 2023-12-16 (×2): qty 1

## 2023-12-16 NOTE — Progress Notes (Signed)
 Echocardiogram 2D Echocardiogram has been performed.  Angel [name] 12/16/2023, 5:43 PM

## 2023-12-16 NOTE — ED Triage Notes (Signed)
 Pt reports with a headache and dizziness since yesterday. Pt states that her balance is off when she stands up. Pt reports hx of stroke years ago.

## 2023-12-16 NOTE — Progress Notes (Signed)
 BLE venous exam is completed. Teffany Blaszczyk, RVT

## 2023-12-16 NOTE — ED Provider Notes (Signed)
  EMERGENCY DEPARTMENT AT [facility] Provider Note   CSN: [id number] Arrival date & time: 12/16/23  9170     Patient presents with: Headache   Angel [name] is a 33 y.o. female.    Headache Pain location:  Generalized Quality:  Dull Severity currently:  8/10 Severity at highest:  8/10 Onset quality:  Sudden Timing:  Constant Progression:  Waxing and waning Chronicity:  New Context: bright light   Relieved by:  Nothing Worsened by:  Nothing Ineffective treatments:  None tried Associated symptoms: loss of balance, neck pain, photophobia and weakness   Associated symptoms: no abdominal pain, no back pain, no blurred vision, no congestion, no cough, no diarrhea, no fatigue, no fever, no nausea, no near-syncope, no numbness, no seizures, no syncope and no vomiting        Prior to Admission medications   Medication Sig Start Date End Date Taking? Authorizing Provider  cyclobenzaprine  (FLEXERIL ) 10 MG tablet Take 1 tablet (10 mg total) by mouth 3 (three) times daily as needed for muscle spasms. 05/29/23   Kennyth Domino, FNP  enoxaparin  (LOVENOX ) 150 MG/ML injection Inject 1 mL (150 mg total) into the skin every 12 (twelve) hours. 12/13/21   Zina Jerilynn LABOR, MD  fluticasone  (FLONASE ) 50 MCG/ACT nasal spray Place 2 sprays into both nostrils daily. 11/22/22   Ward, Harlene PEDLAR, PA-C  furosemide  (LASIX ) 20 MG tablet Take 1 tablet (20 mg total) by mouth daily. 03/17/22 03/17/23  Stinson, Jacob J, DO  naproxen  (NAPROSYN ) 500 MG tablet Take 1 tablet (500 mg total) by mouth 2 (two) times daily with a meal. 07/25/22   Burnette, Delon HERO, PA-C  naproxen  (NAPROSYN ) 500 MG tablet Take 1 tablet (500 mg total) by mouth 2 (two) times daily with a meal. 05/29/23   Kennyth Domino, FNP  nitrofurantoin , macrocrystal-monohydrate, (MACROBID ) 100 MG capsule Take 1 capsule (100 mg total) by mouth 2 (two) times daily. 12/22/22   Vivienne Delon HERO, PA-C  ondansetron  (ZOFRAN -ODT) 4  MG disintegrating tablet Take 1 tablet (4 mg total) by mouth every 8 (eight) hours as needed for nausea or vomiting. 09/12/22   Gladis Elsie BROCKS, PA-C  triamcinolone  cream (KENALOG ) 0.1 % Apply 1 Application topically 2 (two) times daily. 05/09/23   Rolan Berthold, PA-C    Allergies: Patient has no known allergies.    Review of Systems  Constitutional:  Negative for chills, fatigue and fever.  HENT:  Negative for congestion.   Eyes:  Positive for photophobia. Negative for blurred vision.  Respiratory:  Negative for cough, chest tightness, shortness of breath and wheezing.   Cardiovascular:  Negative for chest pain, syncope and near-syncope.  Gastrointestinal:  Negative for abdominal pain, constipation, diarrhea, nausea and vomiting.  Genitourinary:  Negative for dysuria and flank pain.  Musculoskeletal:  Positive for neck pain. Negative for back pain.  Skin:  Negative for rash and wound.  Neurological:  Positive for weakness, headaches and loss of balance. Negative for seizures, syncope, speech difficulty, light-headedness and numbness.  Psychiatric/Behavioral:  Negative for agitation and confusion.   All other systems reviewed and are negative.   Updated Vital Signs BP (!) 140/88 (BP Location: Right Arm)   Pulse 72   Temp 98.5 F (36.9 C) (Oral)   Resp 18   Ht 5' 9 (1.753 m)   Wt 115.2 kg   SpO2 100%   BMI 37.51 kg/m   Physical Exam Vitals and nursing note reviewed.  Constitutional:      General:  She is not in acute distress.    Appearance: She is well-developed. She is not ill-appearing, toxic-appearing or diaphoretic.  HENT:     Head: Normocephalic and atraumatic.     Mouth/Throat:     Mouth: Mucous membranes are moist.  Eyes:     Conjunctiva/sclera: Conjunctivae normal.  Cardiovascular:     Rate and Rhythm: Normal rate and regular rhythm.     Heart sounds: No murmur heard. Pulmonary:     Effort: Pulmonary effort is normal. No respiratory distress.     Breath  sounds: Normal breath sounds.  Abdominal:     Palpations: Abdomen is soft.     Tenderness: There is no abdominal tenderness.  Musculoskeletal:        General: No swelling.     Cervical back: Neck supple.  Skin:    General: Skin is warm and dry.     Capillary Refill: Capillary refill takes less than 2 seconds.  Neurological:     Mental Status: She is alert.     Cranial Nerves: No dysarthria or facial asymmetry.     Sensory: No sensory deficit.     Motor: Weakness present. No tremor.     Comments: Patient has some mildly collective left arm compared to right.  Patient also has weakness in left arm compared to right.  Symmetric smile.  Clear speech.  Pupil symmetric and reactive with normal extraocular movements.  Symmetric strength and sensation in the legs.  Psychiatric:        Mood and Affect: Mood normal.     (all labs ordered are listed, but only abnormal results are displayed) Labs Reviewed  CBC WITH DIFFERENTIAL/PLATELET - Abnormal; Notable for the following components:      Result Value   Hemoglobin 11.8 (*)    RDW 16.5 (*)    All other components within normal limits  COMPREHENSIVE METABOLIC PANEL WITH GFR - Abnormal; Notable for the following components:   Glucose, Bld 117 (*)    All other components within normal limits  VITAMIN B12 - Abnormal; Notable for the following components:   Vitamin B-12 168 (*)    All other components within normal limits  CBG MONITORING, ED - Abnormal; Notable for the following components:   Glucose-Capillary 124 (*)    All other components within normal limits  MRSA NEXT GEN BY PCR, NASAL  TSH  HCG, SERUM, QUALITATIVE  PROTIME-INR  HEMOGLOBIN A1C  ANTITHROMBIN III   FOLATE  URINALYSIS, W/ REFLEX TO CULTURE (INFECTION SUSPECTED)  PROTEIN C ACTIVITY  PROTEIN C, TOTAL  PROTEIN S ACTIVITY  PROTEIN S, TOTAL  LUPUS ANTICOAGULANT PANEL  BETA-2 -GLYCOPROTEIN I ABS, IGG/M/A  HOMOCYSTEINE  FACTOR 5 LEIDEN  PROTHROMBIN GENE MUTATION   CARDIOLIPIN ANTIBODIES, IGG, IGM, IGA  ANA W/REFLEX IF POSITIVE  RAPID URINE DRUG SCREEN, HOSP PERFORMED  RPR  HIV ANTIBODY (ROUTINE TESTING W REFLEX)    EKG: EKG Interpretation Date/Time:  Wednesday December 16 2023 11:31:27 EDT Ventricular Rate:  66 PR Interval:  184 QRS Duration:  127 QT Interval:  415 QTC Calculation: 435 R Axis:   14  Text Interpretation: Sinus rhythm Nonspecific intraventricular conduction delay Abnormal inferior Q waves Borderline T abnormalities, anterior leads when comapred top rior, similar appearance No STEMI Confirmed by Ginger Barefoot ([id number]) on 12/16/2023 3:41:39 PM  Radiology: MR BRAIN WO CONTRAST Result Date: 12/16/2023 CLINICAL DATA:  Stroke, follow up. Headache and dizziness. Left-sided weakness and neglect. Right M1 occlusion and MCA infarct on CT. EXAM: MRI HEAD WITHOUT CONTRAST  TECHNIQUE: Multiplanar, multiecho pulse sequences of the brain and surrounding structures were obtained without intravenous contrast. COMPARISON:  Head CT and CTA earlier today.  Head MRI 01/22/2021. FINDINGS: Brain: There is a moderate-sized acute right MCA infarct which corresponds relatively well to the visible infarct on today's earlier CT and is mildly less than the Tmax>6s volume on CTP. There is greatest involvement of the parietal lobe and insula with at the basal ganglia and frontal lobe involvement as well. There is corresponding T2 FLAIR hyperintensity/cytotoxic edema without significant mass effect or hemorrhage. A small chronic infarct is again noted in the medial right occipital lobe. The ventricles are normal in size. No mass, midline shift, or extra-axial fluid collection is identified. Vascular: Known right M1 occlusion from today's earlier CTA with abnormal signal in more distal MCA branch vessels. Skull and upper cervical spine: Chronically diminished bone marrow T1 signal intensity which is nonspecific but may be related to a history of anemia. Sinuses/Orbits:  Unremarkable orbits. Paranasal sinuses and mastoid air cells are clear. Other: None. IMPRESSION: Moderate-sized acute right MCA infarct. No significant mass effect or hemorrhage. Electronically Signed   By: Dasie Hamburg M.D.   On: 12/16/2023 15:07   CT VENOGRAM HEAD Result Date: 12/16/2023 CLINICAL DATA:  Dural venous sinus thrombosis suspected. Headache, dizziness, and imbalance. History of stroke. EXAM: CT VENOGRAM HEAD TECHNIQUE: Venographic phase images of the brain were obtained following the administration of intravenous contrast. Multiplanar reformats and maximum intensity projections were generated. RADIATION DOSE REDUCTION: This exam was performed according to the departmental dose-optimization program which includes automated exposure control, adjustment of the mA and/or kV according to patient size and/or use of iterative reconstruction technique. CONTRAST:  OMNIPAQUE  IOHEXOL  350 MG/ML SOLN COMPARISON:  CT venogram of the head 01/22/2021 FINDINGS: The superior sagittal sinus, internal cerebral veins, vein of Galen, straight sinus, transverse sinuses, sigmoid sinuses, and jugular bulbs are patent without evidence of thrombus or significant stenosis. IMPRESSION: Negative CT venogram. Electronically Signed   By: Dasie Hamburg M.D.   On: 12/16/2023 11:11   CT ANGIO HEAD NECK W WO CM W PERF (CODE STROKE) Result Date: 12/16/2023 CLINICAL DATA:  Headache, dizziness, and imbalance. History of stroke. EXAM: CT ANGIOGRAPHY HEAD AND NECK CT PERFUSION BRAIN TECHNIQUE: Multidetector CT imaging of the head and neck was performed using the standard protocol during bolus administration of intravenous contrast. Multiplanar CT image reconstructions and MIPs were obtained to evaluate the vascular anatomy. Carotid stenosis measurements (when applicable) are obtained utilizing NASCET criteria, using the distal internal carotid diameter as the denominator. Multiphase CT imaging of the brain was performed following  IV bolus contrast injection. Subsequent parametric perfusion maps were calculated using RAPID software. RADIATION DOSE REDUCTION: This exam was performed according to the departmental dose-optimization program which includes automated exposure control, adjustment of the mA and/or kV according to patient size and/or use of iterative reconstruction technique. CONTRAST:  OMNIPAQUE  IOHEXOL  350 MG/ML SOLN COMPARISON:  CTA head and neck 01/22/2021 FINDINGS: CTA NECK FINDINGS Aortic arch: Standard branching. Widely patent brachiocephalic and subclavian arteries. Right carotid system: Patent without evidence of stenosis or dissection. Partially retropharyngeal course of the proximal ICA. Left carotid system: Patent without evidence of stenosis or dissection. Partially retropharyngeal course of the proximal ICA. Vertebral arteries: Patent and codominant without evidence of stenosis or dissection. Skeleton: No acute osseous abnormality or suspicious lesion. Other neck: Borderline enlarged bilateral cervical lymph nodes, similar to the 2022 CT and nonspecific. No neck mass. Upper chest: Clear  lung apices. Review of the MIP images confirms the above findings CTA HEAD FINDINGS Anterior circulation: The internal carotid arteries are widely patent from skull base to carotid termini. There is a new distal right M1 occlusion with moderate distal collateralization. The left MCA and both ACAs are patent without evidence of a proximal branch occlusion or significant proximal stenosis. No aneurysm is identified. Posterior circulation: The intracranial vertebral arteries are widely patent to the basilar. Patent right PICA, left AICA, and bilateral SCA origins are visualized. The basilar artery is widely patent. There are small right and large left posterior communicating arteries with hypoplasia of the left P1 segment. Both PCAs are patent without evidence of a significant proximal stenosis. No aneurysm is identified. Venous  sinuses: More fully evaluated on the separately reported contemporaneous CT venogram. Anatomic variants: Fetal left PCA. Review of the MIP images confirms the above findings CT Brain Perfusion Findings: ASPECTS: 6 CBF (<30%) Volume: 0 mL Perfusion (Tmax>6.0s) volume: 42 mL Mismatch Volume: The acute core infarct apparent on the preceding noncontrast head CT was not detected by automated rapid processing. Actual mismatch volume/penumbra is estimated to be 20-30 mL. Infarction Location: Right MCA (parietal lobe and insula). Findings were discussed by telephone with Dr. Jerrie on 12/16/2023 at 10:25 a.m. IMPRESSION: 1. Distal right M1 occlusion. 2. Estimated mismatch volume/ischemic penumbra on CT perfusion of 20-30 mL. 3. Widely patent carotid and vertebral arteries. Electronically Signed   By: Dasie Hamburg M.D.   On: 12/16/2023 11:09   CT HEAD CODE STROKE WO CONTRAST Result Date: 12/16/2023 CLINICAL DATA:  Code stroke. Neuro deficit, acute, stroke suspected. Headache, dizziness, and imbalance. EXAM: CT HEAD WITHOUT CONTRAST TECHNIQUE: Contiguous axial images were obtained from the base of the skull through the vertex without intravenous contrast. RADIATION DOSE REDUCTION: This exam was performed according to the departmental dose-optimization program which includes automated exposure control, adjustment of the mA and/or kV according to patient size and/or use of iterative reconstruction technique. COMPARISON:  Head MRI 01/22/2021 FINDINGS: Brain: Cortical and subcortical hypodensity laterally in the right cerebral hemisphere is consistent with an acute MCA infarct and involves the insula, posterior putamen, and parietal lobe. No acute intracranial hemorrhage, mass, midline shift, or extra-axial fluid collection is identified. A chronic infarct in the medial right occipital lobe is unchanged. The ventricles are normal in size. Vascular: Hyperdense right MCA in the sylvian fissure. Skull: No fracture or suspicious  lesion. Sinuses/Orbits: Visualized paranasal sinuses and mastoid air cells are clear. Unremarkable included orbits. Other: None. ASPECTS [facility] Stroke Program Early CT Score) - Ganglionic level infarction (caudate, lentiform nuclei, internal capsule, insula, M1-M3 cortex): 4 - Supraganglionic infarction (M4-M6 cortex): 2 Total score (0-10 with 10 being normal): 6 These results were communicated to Dr. Jerrie at 9:51 am on 12/16/2023 by text page via the [facility] messaging system. IMPRESSION: 1. Acute right MCA infarct with hyperdense right MCA.  ASPECTS of 6. 2. No intracranial hemorrhage. 3. Chronic right occipital infarct. Electronically Signed   By: Dasie Hamburg M.D.   On: 12/16/2023 09:52     Procedures  CRITICAL CARE Performed by: Lonni PARAS Shavelle Runkel Total critical care time: 45 minutes Critical care time was exclusive of separately billable procedures and treating other patients. Critical care was necessary to treat or prevent imminent or life-threatening deterioration. Critical care was time spent personally by me on the following activities: development of treatment plan with patient and/or surrogate as well as nursing, discussions with consultants, evaluation of patient's response to treatment, examination  of patient, obtaining history from patient or surrogate, ordering and performing treatments and interventions, ordering and review of laboratory studies, ordering and review of radiographic studies, pulse oximetry and re-evaluation of patient's condition.   Medications Ordered in the ED   stroke: early stages of recovery book (has no administration in time range)  clopidogrel  (PLAVIX ) tablet 300 mg (300 mg Oral Given 12/16/23 1157)    Followed by  clopidogrel  (PLAVIX ) tablet 75 mg (has no administration in time range)  Oral care mouth rinse (has no administration in time range)  Chlorhexidine  Gluconate Cloth 2 % PADS 6 each (6 each Topical Given 12/16/23 1348)  0.9 %  sodium chloride   infusion ( Intravenous New Bag/Given 12/16/23 1459)  aspirin  EC tablet 81 mg (has no administration in time range)  sodium chloride  0.9 % bolus 1,000 mL ( Intravenous Stopped 12/16/23 1143)  iohexol  (OMNIPAQUE ) 350 MG/ML injection 100 mL (100 mLs Intravenous Contrast Given 12/16/23 0956)  aspirin  suppository 300 mg ( Rectal See Alternative 12/16/23 1142)    Or  aspirin  tablet 325 mg (325 mg Oral Given 12/16/23 1142)  prochlorperazine  (COMPAZINE ) injection 10 mg (10 mg Intravenous Given 12/16/23 1142)  diphenhydrAMINE  (BENADRYL ) injection 25 mg (25 mg Intravenous Given 12/16/23 1142)                                    Medical Decision Making Amount and/or Complexity of Data Reviewed Labs: ordered.  Risk Prescription drug management. Decision regarding hospitalization.    Angel [name] is a 33 y.o. female With past medical history significant for migraines, protein S deficiency with previous strokes not on blood thinners at this time, previous basilic vein thrombosis, obesity, hyperlipidemia, anxiety, and panic attacks who presents with headache and neurologic complaints of dizziness, unsteadiness, and some weakness.  According to patient, she was at her baseline at 2 PM when she went into a meeting and then afterwards started having the symptoms.  She describes as moderate headache that does go to her neck slightly.  She reports no nausea or vomiting or vision changes or speech changes but says she was very unsteady and felt dizzy.  She reports that she was falling to the left and hit a wall.  She either has a bruise or a burn on her left arm from getting a pizza out of the oven with her children last night.  She is right-handed.  She said that she has had strokes before and that was more vision changes that she is not having now.  She has not been on Lovenox  for quite some time.  She denies any new leg pain or leg swelling and denies chest pain palpitations or shortness of breath.  Denies  abdominal pain back pain or flank pain.  No other infectious symptoms but she reports her children had a GI bug last week.  On my exam, patient has symmetric smile.  Speech is clear.  Pupils are symmetric and reactive with normal extraocular movements.  Mouth is somewhat dry.  Patient had intact sensation in face arms and legs however, patient did have a decreased grip strength on the left compared to the right.  She also had some neglect on the left arm compared to the right.  Intact sensation and strength in the legs.  Normal range of motion the neck.  I did not appreciate a carotid bruit on my initial exam.  No murmur.  Patient  otherwise well-appearing.  With her dizziness, unilateral weakness, and some neglect, I spoke to neurology and Dr. Jerrie did want to activate a code stroke.  With her history of clotting disorder and the headache with that she will get a CT venogram as well as a CT head and neck and perfusion.  She will also likely need MRI.  Will order headache cocktail as this could be complicated migraine given her history of migraines after her pregnancy test comes back if it is negative.  Anticipate reassessment after workup to determine disposition.  Initial glucose was not low.  10:53 AM Neurology does see stroke and wants her admitted to the neuro ICU at [facility].  They say she does not need intervention initially.  She will be admitted for further management.  10:58 AM Neurology says that they would like the patient in ED to ED transfer if she does not have a bed in the neuro ICU at [facility] by around 1 PM or about 2 hours from now.     Final diagnoses:  Left arm weakness  Cerebrovascular accident (CVA), unspecified mechanism (HCC)  Nonintractable headache, unspecified chronicity pattern, unspecified headache type     Clinical Impression: 1. Left arm weakness   2. Cerebrovascular accident (CVA), unspecified mechanism (HCC)   3. Nonintractable headache, unspecified  chronicity pattern, unspecified headache type     Disposition: Admit  This note was prepared with assistance of Dragon voice recognition software. Occasional wrong-word or sound-a-like substitutions may have occurred due to the inherent limitations of voice recognition software.      Binnie Droessler, Lonni PARAS, MD 12/16/23 [phone]

## 2023-12-16 NOTE — Consult Note (Addendum)
 [facility] Telemedicine Consult   Requesting Provider: Dr. Ginger  Consult Participants: Myself, bedside nurse, patient, patient's husband Location of the provider: [facility] hospital Location of the patient: [facility]   This consult was provided via telemedicine with 2-way video and audio communication. The patient/family was informed that care would be provided in this way and agreed to receive care in this manner.    Chief Complaint: Headache and dizziness, with concern for left sided weakness and neglect on ED provider evaluation  HPI: 33 year old with PMHx of right occipital lobe stroke (cryptogenic, Dec 2015), BMI 37.5, panic attacks, diabetes, hypertension  She reports that that she was last well on return from work 7/22 at around 12:30 PM.  Subsequently she had headache, dizziness and imbalance.  She presented today for further evaluation.  Case was discussed with me by Dr. Ginger -- given some neglect on exam decision was made to activate code stroke as the patient was within a 24-hour window for potential intervention  Of note she does tell me that she is no longer taking aspirin  as she was told she could discontinue this at discharge from her last pregnancy/delivery in October 2023  She is still taking medications for diabetes and hypertension  10/09/2017 neurology note Ms. Ballowe is a 33 year old female who had been seen in the hospital by me in 2016. At that time she had presented with transient symptoms of dizziness and bilateral leg weakness. She had a history of loss of vision a year prior to that and was found to have a right occipital stroke. Her work-up in [place] at that time had included an MRI brain which showed the subacute stroke and a hypercoagulable profile which showed a low protein C and S. She was evaluated by Hematology at that time and when they rechecked her Protein C and S these were normal. During her hospital admission in 2016 her MRI brain  showed evidence of the old stroke but no new stroke. 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 She had an ECHO and a hypercoagulable profile which was negative. She says that since then she has been doing well. She does not report any recent episodes of weakness or numbness. She denies any recent vision problems. She has a history of migraine headaches but says that her headaches have improved on their own. She is currently 4 months pregnant and so her Ob-Gyn wanted her to come back and see Neurology due to her prior stroke. They had started her on ASA 81 mg daily for pre-eclampsia prophylaxis. She denies any history of DVTs or PEs. She denies recurrent miscarriages. She however says that multiple family members have had DVTs and PEs. She says that her previous pregnancy was before her stroke and was a normal delivery.    LKW: 7/22 ~12:30 PM Thrombolytic given?: No, out of the window IR Thrombectomy? No, see detailed discussion below in assessment Modified Rankin Scale: 0-Completely asymptomatic and back to baseline post- stroke Time of teleneurologist evaluation: 9:20 AM on cart  Exam: Vitals:   12/16/23 0840  BP: (!) 140/88  Pulse: 72  Resp: 18  Temp: 98.5 F (36.9 C)  SpO2: 100%    General: No acute distress  Pulmonary: breathing comfortably Cardiac: regular rate and rhythm on monitor   NIH Stroke scale 1A: Level of Consciousness - 0 1B: Ask Month and Age - 0 1C: 'Blink Eyes' & 'Squeeze Hands' - 0 2: Test Horizontal Extraocular Movements -  0 3: Test Visual Fields - 1 some partial loss of vision on the left side 4: Test Facial Palsy - 0 5A: Test Left Arm Motor Drift - 0 but reduced grip strength  5B: Test Right Arm Motor Drift - 0 6A: Test Left Leg Motor Drift - 1 6B: Test Right Leg Motor Drift - 0 7: Test Limb Ataxia - 0 (within limits of weakness on the left) 8: Test Sensation - 1 -- nearly insensate in the left arm and leg but does still have some sensation there 9:  Test Language/Aphasia- 0 10: Test Dysarthria - 0  11: Test Extinction/Inattention - 1 (when asked to grab an object in the middle grabs towards the right, fairly subtle neglect but also limited evaluation by video) NIHSS score: 4   Imaging Reviewed:   Head CT reviewed  1. Acute right MCA infarct with hyperdense right MCA.  ASPECTS of 6. 2. No intracranial hemorrhage. 3. Chronic right occipital infarct.    Labs reviewed in epic and pertinent values follow:  Basic Metabolic Panel: Recent Labs  Lab 12/16/23 0858  NA 139  K 3.6  CL 107  CO2 23  GLUCOSE 117*  BUN 9  CREATININE 0.55  CALCIUM  9.2    CBC: Recent Labs  Lab 12/16/23 0858  WBC 7.9  NEUTROABS 4.6  HGB 11.8*  HCT 37.1  MCV 87.9  PLT 359    Coagulation Studies: No results for input(s): LABPROT, INR in the last 72 hours.   Head CT aspects 6 with hyperdense right MCA sign   CTA head and neck with right M1 occlusion, fairly good collaterals, and accounting for hypodensity not detected as core by rapid AI software, likely 20 to 30 cc of tissue at risk  Assessment: While she does have a large vessel occlusion, at this time her NIH score does not meet standard criteria for intervention (NIH < 6), she is already nearly 24 hours out from symptom onset, she has a relatively low volume of tissue at risk in an area of the brain that is less eloquent and from which patients tend to make a good recovery and have minimal symptoms.  Therefore overall the risks of intervention were felt to be outweighed by benefit at this time.  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 Jerri of stroke team is accepting physician - ED to ED transfer for closer monitoring at thrombectomy center if no bed available within 1-2 hours at [facility] - Stroke labs HgbA1c, fasting lipid panel - Defer repeat/expanded hypercoagulable workup to stroke team - MRI brain (to be ordered) - Frequent neuro checks, every 1 hour for 24 hours, then every 4 hours - Echocardiogram - Prophylactic therapy-Antiplatelet med: Aspirin  - dose 325mg  PO or 300mg  PR, followed by 81 mg daily - Plavix  300 mg load with 75 mg daily, course to be determined by stroke team - Risk factor modification - Telemetry monitoring; 30 day event monitor on discharge if no arrythmias captured  - Blood pressure goal   -  Permissive hypertension to 220/120 due to acute stroke  - STRICTLY AVOID HYPOTENSION - PT consult, OT consult, Speech consult, unless patient is back to baseline   This patient is receiving care for possible acute neurological changes. There was 95 minutes of care by this provider at the time of service, including time for direct evaluation via telemedicine, review of medical records, imaging studies and discussion of findings with providers, the patient and/or family.  Lola Jernigan MD-PhD [facility] [phone]   If 8pm-8am, please page neurology on call as listed in AMION.  CRITICAL CARE Performed by: Lola LITTIE Jernigan   Total critical care time: 95 minutes  Critical care time was exclusive of separately billable procedures and treating other patients.  Critical care was necessary to treat or prevent imminent or life-threatening deterioration -- emergent evaluation for consideration of thrombectomy / thrombolytic  Critical care was time spent personally by me on the following activities: development of treatment plan with  patient and/or surrogate as well as nursing, discussions with consultants, evaluation of patient's response to treatment, examination of patient, obtaining history from patient or surrogate, ordering and performing treatments and interventions, ordering and review of laboratory studies, ordering and review of radiographic studies, pulse oximetry and re-evaluation of patient's condition.

## 2023-12-16 NOTE — H&P (Signed)
 Stroke Neurology H&P Note  The history was obtained from the pt.  During history and examination, all items were able to obtain unless otherwise noted.  History of Present Illness:  Angel [name] is a 33 y.o. African American female with PMH of obesity, diabetes, hypertension, anxiety/panic attack, migraine, OSA, stroke admitted for headache, dizziness and imbalance.  Per patient and husband, for the last 2 days they were in a lot of stress with kids at home.  Yesterday patient came back from work did not feel well, feeling dizziness and imbalance.  At night she woke up with headache, severe, resembles her migraine headache.  This morning, her condition did not getting better and decided come to ER for regulation.  CT showed acute right MCA infarct with hyperdense right MCA, chronic right occipital infarct.  CTA head and neck showed distal right M1 occlusion and relatively reserved right MCA collateral flow.  CTP 0/42 cc.  MRI pending.  In 04/2014, she was admitted for right PCA infarct with right P3 occlusion.  TCD bubble study no PFO, cerebral angiogram no evidence of vasculitis.  CSF showed WBC 2, protein 15, not consistent with vasculitis.  TEE unremarkable.  Hypercoag workup only showed low protein S activity.  Patient was taken OCP prior to the stroke.  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  Hematology follow-up recommended.  OCP was discontinued.  She was discharged on aspirin  325 and Lipitor 20.  Although no clear record, patient seemed to follow with hematology after stroke, repeat protein S activity normal.  In 01/2015, she was admitted to [facility] for headache, vertigo and bilateral lower extremity heaviness.  MRI no acute infarct.  Consider TIA.   LSN: 12:30 PM yesterday TNK Given: No: Outside window IR: No, mild symptoms,?  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 Left, Per US  09/06/15   Gestational diabetes    H/O insulin  controlled gestational diabetes in prior pregnancy, currently pregnant 09/27/2019   Headache    Hyperlipidemia    Migraine 03/13/2014   Morbid obesity (HCC) 03/13/2014   Obesity    Prediabetes    Protein S deficiency (HCC)     Past Surgical History:  Procedure Laterality Date   IUD INSERTION  03/15/2022   none     TEE WITHOUT CARDIOVERSION N/A 05/17/2014   Procedure: TRANSESOPHAGEAL ECHOCARDIOGRAM (TEE);  Surgeon: Maude JAYSON Emmer, MD;  Location: [facility] ENDOSCOPY;  Service: Cardiovascular;  Laterality: N/A;    Family History  Problem Relation Age of Onset   Hypertension Mother    Asthma Brother    Cancer Paternal Uncle    Diabetes Paternal Uncle    Heart disease Maternal Grandmother    Heart disease Maternal Grandfather    Diabetes Paternal Grandmother    Stroke Neg Hx    Birth defects Neg Hx     Social History:  reports that she has quit smoking. She has never used smokeless tobacco. She reports that she does not currently use alcohol. She reports that she does not use drugs.  Allergies: No Known Allergies  No current facility-administered medications on file prior to encounter.   Current Outpatient Medications on File Prior to Encounter  Medication Sig Dispense Refill   cyclobenzaprine  (FLEXERIL ) 10 MG tablet Take  1 tablet (10 mg total) by mouth 3 (three) times daily as needed for muscle spasms. 30 tablet 0   enoxaparin  (LOVENOX ) 150 MG/ML injection Inject 1 mL (150 mg total) into the skin every 12 (twelve) hours. 30 mL 6   fluticasone  (FLONASE ) 50 MCG/ACT nasal spray Place 2 sprays into both nostrils daily. 16 g 6   furosemide  (LASIX ) 20 MG tablet Take 1 tablet (20 mg total) by mouth daily. 5 tablet 0   naproxen  (NAPROSYN ) 500 MG tablet Take 1 tablet (500 mg total) by mouth 2 (two) times daily with a meal. 30 tablet 0    naproxen  (NAPROSYN ) 500 MG tablet Take 1 tablet (500 mg total) by mouth 2 (two) times daily with a meal. 60 tablet 0   nitrofurantoin , macrocrystal-monohydrate, (MACROBID ) 100 MG capsule Take 1 capsule (100 mg total) by mouth 2 (two) times daily. 10 capsule 0   ondansetron  (ZOFRAN -ODT) 4 MG disintegrating tablet Take 1 tablet (4 mg total) by mouth every 8 (eight) hours as needed for nausea or vomiting. 20 tablet 0   triamcinolone  cream (KENALOG ) 0.1 % Apply 1 Application topically 2 (two) times daily. 30 g 0    Review of Systems: A full ROS was attempted today and was able to be performed.  Systems assessed include - Constitutional, Eyes, HENT, Respiratory, Cardiovascular, Gastrointestinal, Genitourinary, Integument/breast, Hematologic/lymphatic, Musculoskeletal, Neurological, Behavioral/Psych, Endocrine, Allergic/Immunologic - with pertinent responses as per HPI.  Physical Examination: Temp:  [98.1 F (36.7 C)-98.5 F (36.9 C)] 98.1 F (36.7 C) (07/23 1114) Pulse Rate:  [65-96] 65 (07/23 1300) Resp:  [15-18] 16 (07/23 1245) BP: (115-148)/(55-90) 118/55 (07/23 1300) SpO2:  [100 %] 100 % (07/23 1300) Weight:  [115.2 kg] 115.2 kg (07/23 0841)  General - obese, well developed, lethargic but in no apparent distress.    Ophthalmologic - fundi not visualized due to noncooperation.    Cardiovascular - regular rhythm and rate  Mental Status -  Lethargic, eyes closed but open to voice, orientation to time, place, and person were intact. Language including expression, naming, repetition, comprehension was assessed and found intact. Fund of Knowledge was assessed and was intact.  Cranial Nerves II - XII - II - Visual field testing showed left upper quadrantanopsia. III, IV, VI - Extraocular movements intact. V - Facial sensation decreased on the left VII - Facial movement intact bilaterally. VIII - Hearing & vestibular intact bilaterally. X - Palate elevates symmetrically. XI - Chin  turning & shoulder shrug intact bilaterally. XII - Tongue protrusion intact.  Motor Strength - The patient's strength was normal in all extremities and pronator drift was absent except left hand grip and tricep 4/5.   Motor Tone & Bulk - Muscle tone was assessed at the neck and appendages and was normal.  Bulk was normal and fasciculations were absent.   Reflexes - The patient's reflexes were normal in all extremities and she had no pathological reflexes.  Sensory - Light touch, temperature/pinprick were assessed and were decreased on the left upper and lower extremity, with left sensory neglect.    Coordination - The patient had normal movements in the hands with no ataxia or dysmetria.  Tremor was absent.  Gait and Station - deferred  Data Reviewed: CT VENOGRAM HEAD Result Date: 12/16/2023 CLINICAL DATA:  Dural venous sinus thrombosis suspected. Headache, dizziness, and imbalance. History of stroke. EXAM: CT VENOGRAM HEAD TECHNIQUE: Venographic phase images of the brain were obtained following the administration of intravenous contrast. Multiplanar reformats and maximum intensity projections were  generated. RADIATION DOSE REDUCTION: This exam was performed according to the departmental dose-optimization program which includes automated exposure control, adjustment of the mA and/or kV according to patient size and/or use of iterative reconstruction technique. CONTRAST:  OMNIPAQUE  IOHEXOL  350 MG/ML SOLN COMPARISON:  CT venogram of the head 01/22/2021 FINDINGS: The superior sagittal sinus, internal cerebral veins, vein of Galen, straight sinus, transverse sinuses, sigmoid sinuses, and jugular bulbs are patent without evidence of thrombus or significant stenosis. IMPRESSION: Negative CT venogram. Electronically Signed   By: Dasie Hamburg M.D.   On: 12/16/2023 11:11   CT ANGIO HEAD NECK W WO CM W PERF (CODE STROKE) Result Date: 12/16/2023 CLINICAL DATA:  Headache, dizziness, and imbalance.  History of stroke. EXAM: CT ANGIOGRAPHY HEAD AND NECK CT PERFUSION BRAIN TECHNIQUE: Multidetector CT imaging of the head and neck was performed using the standard protocol during bolus administration of intravenous contrast. Multiplanar CT image reconstructions and MIPs were obtained to evaluate the vascular anatomy. Carotid stenosis measurements (when applicable) are obtained utilizing NASCET criteria, using the distal internal carotid diameter as the denominator. Multiphase CT imaging of the brain was performed following IV bolus contrast injection. Subsequent parametric perfusion maps were calculated using RAPID software. RADIATION DOSE REDUCTION: This exam was performed according to the departmental dose-optimization program which includes automated exposure control, adjustment of the mA and/or kV according to patient size and/or use of iterative reconstruction technique. CONTRAST:  OMNIPAQUE  IOHEXOL  350 MG/ML SOLN COMPARISON:  CTA head and neck 01/22/2021 FINDINGS: CTA NECK FINDINGS Aortic arch: Standard branching. Widely patent brachiocephalic and subclavian arteries. Right carotid system: Patent without evidence of stenosis or dissection. Partially retropharyngeal course of the proximal ICA. Left carotid system: Patent without evidence of stenosis or dissection. Partially retropharyngeal course of the proximal ICA. Vertebral arteries: Patent and codominant without evidence of stenosis or dissection. Skeleton: No acute osseous abnormality or suspicious lesion. Other neck: Borderline enlarged bilateral cervical lymph nodes, similar to the 2022 CT and nonspecific. No neck mass. Upper chest: Clear lung apices. Review of the MIP images confirms the above findings CTA HEAD FINDINGS Anterior circulation: The internal carotid arteries are widely patent from skull base to carotid termini. There is a new distal right M1 occlusion with moderate distal collateralization. The left MCA and both ACAs are patent  without evidence of a proximal branch occlusion or significant proximal stenosis. No aneurysm is identified. Posterior circulation: The intracranial vertebral arteries are widely patent to the basilar. Patent right PICA, left AICA, and bilateral SCA origins are visualized. The basilar artery is widely patent. There are small right and large left posterior communicating arteries with hypoplasia of the left P1 segment. Both PCAs are patent without evidence of a significant proximal stenosis. No aneurysm is identified. Venous sinuses: More fully evaluated on the separately reported contemporaneous CT venogram. Anatomic variants: Fetal left PCA. Review of the MIP images confirms the above findings CT Brain Perfusion Findings: ASPECTS: 6 CBF (<30%) Volume: 0 mL Perfusion (Tmax>6.0s) volume: 42 mL Mismatch Volume: The acute core infarct apparent on the preceding noncontrast head CT was not detected by automated rapid processing. Actual mismatch volume/penumbra is estimated to be 20-30 mL. Infarction Location: Right MCA (parietal lobe and insula). Findings were discussed by telephone with Dr. Jerrie on 12/16/2023 at 10:25 a.m. IMPRESSION: 1. Distal right M1 occlusion. 2. Estimated mismatch volume/ischemic penumbra on CT perfusion of 20-30 mL. 3. Widely patent carotid and vertebral arteries. Electronically Signed   By: Dasie Hamburg M.D.   On:  12/16/2023 11:09   CT HEAD CODE STROKE WO CONTRAST Result Date: 12/16/2023 CLINICAL DATA:  Code stroke. Neuro deficit, acute, stroke suspected. Headache, dizziness, and imbalance. EXAM: CT HEAD WITHOUT CONTRAST TECHNIQUE: Contiguous axial images were obtained from the base of the skull through the vertex without intravenous contrast. RADIATION DOSE REDUCTION: This exam was performed according to the departmental dose-optimization program which includes automated exposure control, adjustment of the mA and/or kV according to patient size and/or use of iterative reconstruction  technique. COMPARISON:  Head MRI 01/22/2021 FINDINGS: Brain: Cortical and subcortical hypodensity laterally in the right cerebral hemisphere is consistent with an acute MCA infarct and involves the insula, posterior putamen, and parietal lobe. No acute intracranial hemorrhage, mass, midline shift, or extra-axial fluid collection is identified. A chronic infarct in the medial right occipital lobe is unchanged. The ventricles are normal in size. Vascular: Hyperdense right MCA in the sylvian fissure. Skull: No fracture or suspicious lesion. Sinuses/Orbits: Visualized paranasal sinuses and mastoid air cells are clear. Unremarkable included orbits. Other: None. ASPECTS [facility] Stroke Program Early CT Score) - Ganglionic level infarction (caudate, lentiform nuclei, internal capsule, insula, M1-M3 cortex): 4 - Supraganglionic infarction (M4-M6 cortex): 2 Total score (0-10 with 10 being normal): 6 These results were communicated to Dr. Jerrie at 9:51 am on 12/16/2023 by text page via the [facility] messaging system. IMPRESSION: 1. Acute right MCA infarct with hyperdense right MCA.  ASPECTS of 6. 2. No intracranial hemorrhage. 3. Chronic right occipital infarct. Electronically Signed   By: Dasie Hamburg M.D.   On: 12/16/2023 09:52    Assessment: 33 y.o. female with PMH of obesity, diabetes, hypertension, anxiety/panic attack, migraine, OSA, stroke admitted for headache, dizziness and imbalance. CT showed acute right MCA infarct with hyperdense right MCA, chronic right occipital infarct.  CTA head and neck showed distal right M1 occlusion and relatively reserved right MCA collateral flow.  CTP 0/42 cc.  CTV neg. MRI pending. She had R PCA stroke in 04/2014, cryptogenic, and TIA in 01/2015. Initial concern of protein S deficiency but repeat normal level per report. Pt mild symptoms with questionable chronic MCA occlusion, not candidate for IR for now. Will repeat hypercoagulable work up again and to rule out DVT. Continue DAPT for  now. Continue ICU close monitoring, if neuro decline, may consider IR for thrombectomy.   Stroke Risk Factors - hx of stroke, obesity, HTN, DM, OSA  Plan: Continue ICU close monitoring. if neuro decline, may consider IR for thrombectomy.  Frequent neuro checks Telemetry monitoring MRI brain  Echocardiogram  UDS, fasting lipid panel and HgbA1C Hypercoagulable work up repeat LE venous doppler PT/OT/speech consult Permissive hypertension (only treat if BP > 220/120) for 24-48 hours post stroke onset GI and DVT prophylaxis  Continue DAPT after load Heart health diet   Thank you for this consultation and allowing us  to participate in the care of this patient.  Ary Cummins, MD PhD Stroke Neurology 12/16/2023 2:33 PM  This patient is critically ill due to left M1 occlusion, left MCA stroke, history of stroke and at significant risk of neurological worsening, death form recurrent stroke, hemorrhagic transmission. This patient's care requires constant monitoring of vital signs, hemodynamics, respiratory and cardiac monitoring, review of multiple databases, neurological assessment, discussion with family, other specialists and medical decision making of high complexity. I spent 50 minutes of neurocritical care time in the care of this patient. I had long discussion with husband at bedside, updated pt current condition, treatment plan and potential prognosis, and answered  all the questions.  He expressed understanding and appreciation.

## 2023-12-17 DIAGNOSIS — E785 Hyperlipidemia, unspecified: Secondary | ICD-10-CM | POA: Diagnosis not present

## 2023-12-17 DIAGNOSIS — I63511 Cerebral infarction due to unspecified occlusion or stenosis of right middle cerebral artery: Secondary | ICD-10-CM | POA: Diagnosis not present

## 2023-12-17 DIAGNOSIS — R29702 NIHSS score 2: Secondary | ICD-10-CM | POA: Diagnosis not present

## 2023-12-17 LAB — LIPID PANEL
Cholesterol: 142 mg/dL (ref 0–200)
HDL: 38 mg/dL — ABNORMAL LOW (ref >40)
LDL Cholesterol: 91 mg/dL (ref 0–99)
Total CHOL/HDL Ratio: 3.7 ratio
Triglycerides: 66 mg/dL (ref <150)
VLDL: 13 mg/dL (ref 0–40)

## 2023-12-17 LAB — HEMOGLOBIN A1C
Hgb A1c MFr Bld: 5.6 % (ref 4.8–5.6)
Mean Plasma Glucose: 114.02 mg/dL

## 2023-12-17 LAB — LUPUS ANTICOAGULANT PANEL
DRVVT: 34.6 s (ref 0.0–47.0)
PTT Lupus Anticoagulant: 34.8 s (ref 0.0–43.5)

## 2023-12-17 LAB — BETA-2-GLYCOPROTEIN I ABS, IGG/M/A
Beta-2 Glyco I IgG: <9 GPI IgG units (ref 0–20)
Beta-2-Glycoprotein I IgA: <9 GPI IgA units (ref 0–25)
Beta-2-Glycoprotein I IgM: <9 GPI IgM units (ref 0–32)

## 2023-12-17 LAB — RPR: RPR Ser Ql: NONREACTIVE

## 2023-12-17 LAB — PROTEIN S, TOTAL: Protein S Ag, Total: 84 % (ref 60–150)

## 2023-12-17 LAB — ANA W/REFLEX IF POSITIVE: Anti Nuclear Antibody (ANA): NEGATIVE

## 2023-12-17 LAB — PROTEIN S ACTIVITY: Protein S Activity: 70 % (ref 63–140)

## 2023-12-17 LAB — PROTEIN C ACTIVITY: Protein C Activity: 76 % (ref 73–180)

## 2023-12-17 MED ORDER — VITAMIN B-12 1000 MCG PO TABS: 1000.0000 ug | ORAL_TABLET | Freq: Every day | ORAL | Status: DC

## 2023-12-17 MED ORDER — ALPRAZOLAM 0.25 MG PO TABS
1.0000 mg | ORAL_TABLET | Freq: Every day | ORAL | Status: DC | PRN
  Administered 2023-12-17: 1 mg via ORAL
  Filled 2023-12-17: qty 2

## 2023-12-17 MED ORDER — ENOXAPARIN SODIUM 60 MG/0.6ML IJ SOSY
60.0000 mg | PREFILLED_SYRINGE | INTRAMUSCULAR | Status: DC
  Administered 2023-12-17: 60 mg via SUBCUTANEOUS
  Filled 2023-12-17: qty 0.6

## 2023-12-17 MED ORDER — ATORVASTATIN CALCIUM 10 MG PO TABS
20.0000 mg | ORAL_TABLET | Freq: Every day | ORAL | Status: DC
  Administered 2023-12-18: 20 mg via ORAL
  Filled 2023-12-17: qty 2

## 2023-12-17 MED ORDER — CYANOCOBALAMIN 1000 MCG/ML IJ SOLN
1000.0000 ug | Freq: Every day | INTRAMUSCULAR | Status: DC
  Administered 2023-12-17 – 2023-12-18 (×2): 1000 ug via INTRAMUSCULAR
  Filled 2023-12-17 (×2): qty 1

## 2023-12-17 NOTE — Evaluation (Signed)
 Speech Language Pathology Evaluation Patient Details Name: Angel [name] MRN: [id number] DOB: March 28, 1991 Today's Date: 12/17/2023 Time: 9041-8993 SLP Time Calculation (min) (ACUTE ONLY): 8 min  Problem List:  Patient Active Problem List   Diagnosis Date Noted   Acute ischemic right MCA stroke (HCC) 12/16/2023   Unwanted fertility    GDM (gestational diabetes mellitus) 03/14/2022   Gestational diabetes    BMI 45.0-49.9, adult (HCC) 11/14/2021   Prediabetes 10/17/2021   Supervision of high risk pregnancy, antepartum 08/27/2021   Protein S deficiency affecting pregnancy, antepartum (HCC) 09/12/2019   History of pre-eclampsia in prior pregnancy, currently pregnant 09/12/2019   Maternal morbid obesity, antepartum (HCC) 09/12/2019   Vitamin D deficiency 02/12/2015   Panic attacks 02/09/2015   Anxiety state 02/09/2015   Frequent headaches 02/09/2015   Protein S deficiency (HCC)    red chart,History of ischemic stroke x 2 in 2015 05/13/2014   Cerebral infarction (HCC) 05/13/2014   Anemia    Migraine 03/13/2014   Morbid obesity (HCC) 03/13/2014   Past Medical History:  Past Medical History:  Diagnosis Date   Acute ischemic stroke (HCC) 05/13/2014   Anemia    Anxiety    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 Left, Per US  09/06/15   Gestational diabetes    H/O insulin  controlled gestational diabetes in prior pregnancy, currently pregnant 09/27/2019   Headache    Hyperlipidemia    Migraine 03/13/2014   Morbid obesity (HCC) 03/13/2014   Obesity    Prediabetes    Protein S deficiency (HCC)    Past Surgical History:  Past Surgical History:  Procedure Laterality Date   IUD INSERTION  03/15/2022   none     TEE WITHOUT CARDIOVERSION N/A 05/17/2014   Procedure: TRANSESOPHAGEAL ECHOCARDIOGRAM (TEE);  Surgeon: Maude JAYSON Emmer, MD;  Location: [facility];  Service: Cardiovascular;  Laterality: N/A;   HPI:  33 yo female  presents to [facility] from [facility] on 7/23 for headache, dizziness, and imbalance. CT and MRI show R MCA infarct. PMH: R PCA infarct (Dec 2015), obesity, diabetes, hypertension, anxiety/panic attack, migraine, OSA   Assessment / Plan / Recommendation Clinical Impression  Pt reports she is typically independent at baseline and denies any acute changes related to cognition. She presents with deficits related to memory recall and calculations as evidenced through administration of chosen subsections of the Cognistat. Although pt states she does not feel different from baseline, SLP plans to f/u at least briefly given age and independence PTA.    SLP Assessment  SLP Recommendation/Assessment: Patient needs continued Speech Language Pathology Services SLP Visit Diagnosis: Cognitive communication deficit (R41.841)     Assistance Recommended at Discharge  PRN  Functional Status Assessment Patient has had a recent decline in their functional status and demonstrates the ability to make significant improvements in function in a reasonable and predictable amount of time.  Frequency and Duration min 2x/week  1 week      SLP Evaluation Cognition  Overall Cognitive Status: Impaired/Different from baseline Arousal/Alertness: Awake/alert Orientation Level: Oriented X4 Attention: Selective Selective Attention: Appears intact Memory: Impaired Memory Impairment: Retrieval deficit Awareness: Appears intact Problem Solving: Impaired Problem Solving Impairment: Verbal basic Executive Function: Reasoning Reasoning: Appears intact       Comprehension  Auditory Comprehension Overall Auditory Comprehension: Appears within functional limits for tasks assessed    Expression Expression Primary Mode of Expression: Verbal Verbal Expression Overall Verbal Expression: Appears within functional limits for tasks  assessed Written Expression Dominant Hand: Right   Oral / Motor  Oral Motor/Sensory Function Overall  Oral Motor/Sensory Function: Within functional limits Motor Speech Overall Motor Speech: Appears within functional limits for tasks assessed            Damien Blumenthal, M.A., CCC-SLP Speech Language Pathology, Acute Rehabilitation Services  Secure Chat preferred [phone]  12/17/2023, 10:21 AM

## 2023-12-17 NOTE — Progress Notes (Signed)
 OT Cancellation Note  Patient Details Name: Angel [name] MRN: [id number] DOB: December 16, 1990   Cancelled Treatment:    Reason Eval/Treat Not Completed: OT screened, no needs identified, will sign off  [name] 12/17/2023, 10:45 AM 12/17/2023  RP, OTR/L  Acute Rehabilitation Services  Office:  [phone]

## 2023-12-17 NOTE — H&P (View-Only) (Signed)
 STROKE TEAM PROGRESS NOTE   INTERIM HISTORY/SUBJECTIVE  Symptoms improving. Has some left visual neglect and sensory neglect. Plan to transfer out of ICU.   OBJECTIVE  CBC    Component Value Date/Time   WBC 7.9 12/16/2023 0858   RBC 4.22 12/16/2023 0858   HGB 11.8 (L) 12/16/2023 0858   HGB 10.3 (L) 01/08/2022 1014   HCT 37.1 12/16/2023 0858   HCT 32.2 (L) 01/08/2022 1014   PLT 359 12/16/2023 0858   PLT 350 01/08/2022 1014   MCV 87.9 12/16/2023 0858   MCV 81 01/08/2022 1014   MCH 28.0 12/16/2023 0858   MCHC 31.8 12/16/2023 0858   RDW 16.5 (H) 12/16/2023 0858   RDW 13.9 01/08/2022 1014   LYMPHSABS 3.0 12/16/2023 0858   LYMPHSABS 2.5 10/15/2021 0848   MONOABS 0.3 12/16/2023 0858   EOSABS 0.0 12/16/2023 0858   EOSABS 0.0 10/15/2021 0848   BASOSABS 0.0 12/16/2023 0858   BASOSABS 0.0 10/15/2021 0848    BMET    Component Value Date/Time   NA 139 12/16/2023 0858   NA 134 10/15/2021 0848   K 3.6 12/16/2023 0858   CL 107 12/16/2023 0858   CO2 23 12/16/2023 0858   GLUCOSE 117 (H) 12/16/2023 0858   BUN 9 12/16/2023 0858   BUN 8 10/15/2021 0848   CREATININE 0.55 12/16/2023 0858   CREATININE 0.71 07/17/2014 1558   CALCIUM  9.2 12/16/2023 0858   EGFR 123 10/15/2021 0848   GFRNONAA >60 12/16/2023 0858    IMAGING past 24 hours ECHOCARDIOGRAM COMPLETE Result Date: 12/16/2023    ECHOCARDIOGRAM REPORT   Patient Name:   Angel [name] Date of Exam: 12/16/2023 Medical Rec #:  [id number]             Height:       69.0 in Accession #:    [id number]            Weight:       254.0 lb Date of Birth:  [birth year]             BSA:          2.287 m Patient Age:    33 years              BP:           113/69 mmHg Patient Gender: F                     HR:           66 bpm. Exam Location:  Inpatient Procedure: 2D Echo, Cardiac Doppler and Color Doppler (Both Spectral and Color            Flow Doppler were utilized during procedure). Indications:    Stroke I63.9  History:        Patient has  prior history of Echocardiogram examinations, most                 recent 05/14/2014. Stroke and Migraine.  Sonographer:    Thea Norlander RCS Referring Phys: [id number] SRISHTI L BHAGAT IMPRESSIONS  1. Left ventricular ejection fraction, by estimation, is 60 to 65%. The left ventricle has normal function. The left ventricle has no regional wall motion abnormalities. Left ventricular diastolic parameters were normal.  2. Right ventricular systolic function is normal. The right ventricular size is normal.  3. The mitral valve is normal in structure. Mild mitral valve regurgitation.  4. The aortic valve is tricuspid. Aortic valve regurgitation is not  visualized.  5. The inferior vena cava is normal in size with greater than 50% respiratory variability, suggesting right atrial pressure of 3 mmHg. FINDINGS  Left Ventricle: Left ventricular ejection fraction, by estimation, is 60 to 65%. The left ventricle has normal function. The left ventricle has no regional wall motion abnormalities. The left ventricular internal cavity size was normal in size. There is  no left ventricular hypertrophy. Left ventricular diastolic parameters were normal. Right Ventricle: The right ventricular size is normal. Right vetricular wall thickness was not assessed. Right ventricular systolic function is normal. Left Atrium: Left atrial size was normal in size. Right Atrium: Right atrial size was normal in size. Pericardium: There is no evidence of pericardial effusion. Mitral Valve: The mitral valve is normal in structure. Mild mitral valve regurgitation. Tricuspid Valve: The tricuspid valve is normal in structure. Tricuspid valve regurgitation is trivial. Aortic Valve: The aortic valve is tricuspid. Aortic valve regurgitation is not visualized. Aortic valve peak gradient measures 10.1 mmHg. Pulmonic Valve: The pulmonic valve was normal in structure. Pulmonic valve regurgitation is not visualized. Aorta: The aortic root and ascending aorta are  structurally normal, with no evidence of dilitation. Venous: The inferior vena cava is normal in size with greater than 50% respiratory variability, suggesting right atrial pressure of 3 mmHg. IAS/Shunts: No atrial level shunt detected by color flow Doppler.  LEFT VENTRICLE PLAX 2D LVIDd:         5.00 cm   Diastology LVIDs:         3.10 cm   LV e' medial:    11.40 cm/s LV PW:         0.80 cm   LV E/e' medial:  9.6 LV IVS:        0.80 cm   LV e' lateral:   17.10 cm/s LVOT diam:     2.00 cm   LV E/e' lateral: 6.4 LV SV:         81 LV SV Index:   35 LVOT Area:     3.14 cm  RIGHT VENTRICLE             IVC RV S prime:     13.20 cm/s  IVC diam: 2.00 cm TAPSE (M-mode): 2.3 cm LEFT ATRIUM             Index        RIGHT ATRIUM           Index LA diam:        3.30 cm 1.44 cm/m   RA Area:     14.10 cm LA Vol (A2C):   43.0 ml 18.80 ml/m  RA Volume:   29.50 ml  12.90 ml/m LA Vol (A4C):   48.9 ml 21.38 ml/m LA Biplane Vol: 49.4 ml 21.60 ml/m  AORTIC VALVE AV Area (Vmax): 2.41 cm AV Vmax:        159.00 cm/s AV Peak Grad:   10.1 mmHg LVOT Vmax:      122.00 cm/s LVOT Vmean:     80.600 cm/s LVOT VTI:       0.257 m  AORTA Ao Root diam: 2.70 cm Ao Asc diam:  2.70 cm MITRAL VALVE MV Area (PHT): 2.80 cm     SHUNTS MV Decel Time: 271 msec     Systemic VTI:  0.26 m MV E velocity: 110.00 cm/s  Systemic Diam: 2.00 cm MV A velocity: 88.30 cm/s MV E/A ratio:  1.25 Vina Gull MD Electronically signed by Vina Gull MD Signature  Date/Time: 12/16/2023/7:53:08 PM    Final    VAS US  LOWER EXTREMITY VENOUS (DVT) Result Date: 12/16/2023  Lower Venous DVT Study Patient Name:  Angel [name]  Date of Exam:   12/16/2023 Medical Rec #: [id number]              Accession #:    [id number] Date of Birth: 12-26-1990              Patient Gender: F Patient Age:   33 years Exam Location:  [facility] Procedure:      VAS US  LOWER EXTREMITY VENOUS (DVT) Referring Phys: ARY Griffin Dewilde  --------------------------------------------------------------------------------  Indications: Stroke.  Performing Technologist: Elmarie Lindau, RVT  Examination Guidelines: A complete evaluation includes B-mode imaging, spectral Doppler, color Doppler, and power Doppler as needed of all accessible portions of each vessel. Bilateral testing is considered an integral part of a complete examination. Limited examinations for reoccurring indications may be performed as noted. The reflux portion of the exam is performed with the patient in reverse Trendelenburg.  +---------+---------------+---------+-----------+----------+--------------+ RIGHT    CompressibilityPhasicitySpontaneityPropertiesThrombus Aging +---------+---------------+---------+-----------+----------+--------------+ CFV      Full           Yes      Yes                                 +---------+---------------+---------+-----------+----------+--------------+ SFJ      Full                                                        +---------+---------------+---------+-----------+----------+--------------+ FV Prox  Full                                                        +---------+---------------+---------+-----------+----------+--------------+ FV Mid   Full                                                        +---------+---------------+---------+-----------+----------+--------------+ FV DistalFull                                                        +---------+---------------+---------+-----------+----------+--------------+ PFV      Full                                                        +---------+---------------+---------+-----------+----------+--------------+ POP      Full           Yes      Yes                                 +---------+---------------+---------+-----------+----------+--------------+  PTV      Full                                                         +---------+---------------+---------+-----------+----------+--------------+ PERO     Full                                                        +---------+---------------+---------+-----------+----------+--------------+   +---------+---------------+---------+-----------+----------+--------------+ LEFT     CompressibilityPhasicitySpontaneityPropertiesThrombus Aging +---------+---------------+---------+-----------+----------+--------------+ CFV      Full           Yes      Yes                                 +---------+---------------+---------+-----------+----------+--------------+ SFJ      Full                                                        +---------+---------------+---------+-----------+----------+--------------+ FV Prox  Full                                                        +---------+---------------+---------+-----------+----------+--------------+ FV Mid   Full                                                        +---------+---------------+---------+-----------+----------+--------------+ FV DistalFull                                                        +---------+---------------+---------+-----------+----------+--------------+ PFV      Full                                                        +---------+---------------+---------+-----------+----------+--------------+ POP      Full           Yes      Yes                                 +---------+---------------+---------+-----------+----------+--------------+ PTV      Full                                                        +---------+---------------+---------+-----------+----------+--------------+  PERO     Full                                                        +---------+---------------+---------+-----------+----------+--------------+    Summary: RIGHT: - There is no evidence of deep vein thrombosis in the lower extremity.  - No cystic structure found in  the popliteal fossa.  LEFT: - There is no evidence of deep vein thrombosis in the lower extremity.  - No cystic structure found in the popliteal fossa.  *See table(s) above for measurements and observations.    Preliminary    MR BRAIN WO CONTRAST Result Date: 12/16/2023 CLINICAL DATA:  Stroke, follow up. Headache and dizziness. Left-sided weakness and neglect. Right M1 occlusion and MCA infarct on CT. EXAM: MRI HEAD WITHOUT CONTRAST TECHNIQUE: Multiplanar, multiecho pulse sequences of the brain and surrounding structures were obtained without intravenous contrast. COMPARISON:  Head CT and CTA earlier today.  Head MRI 01/22/2021. FINDINGS: Brain: There is a moderate-sized acute right MCA infarct which corresponds relatively well to the visible infarct on today's earlier CT and is mildly less than the Tmax>6s volume on CTP. There is greatest involvement of the parietal lobe and insula with at the basal ganglia and frontal lobe involvement as well. There is corresponding T2 FLAIR hyperintensity/cytotoxic edema without significant mass effect or hemorrhage. A small chronic infarct is again noted in the medial right occipital lobe. The ventricles are normal in size. No mass, midline shift, or extra-axial fluid collection is identified. Vascular: Known right M1 occlusion from today's earlier CTA with abnormal signal in more distal MCA branch vessels. Skull and upper cervical spine: Chronically diminished bone marrow T1 signal intensity which is nonspecific but may be related to a history of anemia. Sinuses/Orbits: Unremarkable orbits. Paranasal sinuses and mastoid air cells are clear. Other: None. IMPRESSION: Moderate-sized acute right MCA infarct. No significant mass effect or hemorrhage. Electronically Signed   By: Dasie Hamburg M.D.   On: 12/16/2023 15:07   CT VENOGRAM HEAD Result Date: 12/16/2023 CLINICAL DATA:  Dural venous sinus thrombosis suspected. Headache, dizziness, and imbalance. History of stroke. EXAM:  CT VENOGRAM HEAD TECHNIQUE: Venographic phase images of the brain were obtained following the administration of intravenous contrast. Multiplanar reformats and maximum intensity projections were generated. RADIATION DOSE REDUCTION: This exam was performed according to the departmental dose-optimization program which includes automated exposure control, adjustment of the mA and/or kV according to patient size and/or use of iterative reconstruction technique. CONTRAST:  OMNIPAQUE  IOHEXOL  350 MG/ML SOLN COMPARISON:  CT venogram of the head 01/22/2021 FINDINGS: The superior sagittal sinus, internal cerebral veins, vein of Galen, straight sinus, transverse sinuses, sigmoid sinuses, and jugular bulbs are patent without evidence of thrombus or significant stenosis. IMPRESSION: Negative CT venogram. Electronically Signed   By: Dasie Hamburg M.D.   On: 12/16/2023 11:11   CT ANGIO HEAD NECK W WO CM W PERF (CODE STROKE) Result Date: 12/16/2023 CLINICAL DATA:  Headache, dizziness, and imbalance. History of stroke. EXAM: CT ANGIOGRAPHY HEAD AND NECK CT PERFUSION BRAIN TECHNIQUE: Multidetector CT imaging of the head and neck was performed using the standard protocol during bolus administration of intravenous contrast. Multiplanar CT image reconstructions and MIPs were obtained to evaluate the vascular anatomy. Carotid stenosis measurements (when applicable) are obtained utilizing NASCET criteria, using the distal internal carotid  diameter as the denominator. Multiphase CT imaging of the brain was performed following IV bolus contrast injection. Subsequent parametric perfusion maps were calculated using RAPID software. RADIATION DOSE REDUCTION: This exam was performed according to the departmental dose-optimization program which includes automated exposure control, adjustment of the mA and/or kV according to patient size and/or use of iterative reconstruction technique. CONTRAST:  OMNIPAQUE  IOHEXOL  350 MG/ML SOLN  COMPARISON:  CTA head and neck 01/22/2021 FINDINGS: CTA NECK FINDINGS Aortic arch: Standard branching. Widely patent brachiocephalic and subclavian arteries. Right carotid system: Patent without evidence of stenosis or dissection. Partially retropharyngeal course of the proximal ICA. Left carotid system: Patent without evidence of stenosis or dissection. Partially retropharyngeal course of the proximal ICA. Vertebral arteries: Patent and codominant without evidence of stenosis or dissection. Skeleton: No acute osseous abnormality or suspicious lesion. Other neck: Borderline enlarged bilateral cervical lymph nodes, similar to the 2022 CT and nonspecific. No neck mass. Upper chest: Clear lung apices. Review of the MIP images confirms the above findings CTA HEAD FINDINGS Anterior circulation: The internal carotid arteries are widely patent from skull base to carotid termini. There is a new distal right M1 occlusion with moderate distal collateralization. The left MCA and both ACAs are patent without evidence of a proximal branch occlusion or significant proximal stenosis. No aneurysm is identified. Posterior circulation: The intracranial vertebral arteries are widely patent to the basilar. Patent right PICA, left AICA, and bilateral SCA origins are visualized. The basilar artery is widely patent. There are small right and large left posterior communicating arteries with hypoplasia of the left P1 segment. Both PCAs are patent without evidence of a significant proximal stenosis. No aneurysm is identified. Venous sinuses: More fully evaluated on the separately reported contemporaneous CT venogram. Anatomic variants: Fetal left PCA. Review of the MIP images confirms the above findings CT Brain Perfusion Findings: ASPECTS: 6 CBF (<30%) Volume: 0 mL Perfusion (Tmax>6.0s) volume: 42 mL Mismatch Volume: The acute core infarct apparent on the preceding noncontrast head CT was not detected by automated rapid processing. Actual  mismatch volume/penumbra is estimated to be 20-30 mL. Infarction Location: Right MCA (parietal lobe and insula). Findings were discussed by telephone with Dr. Jerrie on 12/16/2023 at 10:25 a.m. IMPRESSION: 1. Distal right M1 occlusion. 2. Estimated mismatch volume/ischemic penumbra on CT perfusion of 20-30 mL. 3. Widely patent carotid and vertebral arteries. Electronically Signed   By: Dasie Hamburg M.D.   On: 12/16/2023 11:09    Vitals:   12/17/23 0630 12/17/23 0700 12/17/23 0800 12/17/23 0900  BP:  136/84 (!) 142/76 134/76  Pulse: 78 84 65 83  Resp: 15 16 14    Temp:   98.9 F (37.2 C)   TempSrc:   Oral   SpO2: 98% 97% 97% 98%  Weight:      Height:         PHYSICAL EXAM General:  Alert, well-nourished, well-developed patient in no acute distress Psych:  Mood and affect appropriate for situation CV: Regular rate and rhythm on monitor Respiratory:  Regular, unlabored respirations on room air GI: Abdomen soft and nontender   NEURO:  Mental Status: AA&Ox3, patient is able to give clear and coherent history Speech/Language: speech is without dysarthria or aphasia.  Naming, repetition, fluency, and comprehension intact.  Cranial Nerves:  II: PERRL. Visual fields full. Visual neglect on the left III, IV, VI: EOMI. Eyelids elevate symmetrically.  V: Sensation is intact to light touch and symmetrical to face.  VII: Face is symmetrical resting and  smiling VIII: hearing intact to voice. IX, X: Palate elevates symmetrically. Phonation is normal.  KP:Dynloizm shrug 5/5. XII: tongue is midline without fasciculations. Motor: 5/5 strength to all muscle groups tested.  Tone: is normal and bulk is normal Sensation- Intact to light touch bilaterally.  Sensory neglect improving Coordination: FTN intact bilaterally, HKS: no ataxia in BLE.No drift.  Gait- deferred  Most Recent NIH 2     ASSESSMENT/PLAN  Angel [name] is a 33 y.o. female with history of obesity, diabetes,  hypertension, anxiety/panic attack, migraine, OSA, stroke admitted for headache, dizziness and imbalance. CT showed acute right MCA infarct with hyperdense right MCA, chronic right occipital infarct.  CTA head and neck showed distal right M1 occlusion and relatively reserved right MCA collateral flow.  CTP 0/42 cc.  CTV neg. MRI pending. She had R PCA stroke in 04/2014, cryptogenic, and TIA in 01/2015. Initial concern of protein S deficiency but repeat normal level per report. Pt mild symptoms with questionable chronic MCA occlusion, not candidate for IR for now.  NIH on Admission 2  stroke:  right MCA moderate sized infarcts with R M1 occlusion, etiology: cryptogenic   Code Stroke CT head Acute right MCA infarct with hyperdense right MCA. ASPECTS of 6  CTA head & neck Distal right M1 occlusion.  CTP 0/42 cc MRI  Moderate-sized acute right MCA infarct. No significant mass effect or hemorrhage. DVT Negative TEE 7/25 pending 2D Echo EF 60-65%  LDL 91 HgbA1c 5.6 Hypercoagulable work up pending ANA neg UDS pending VTE prophylaxis - Lovenox  No antithrombotic prior to admission, now on aspirin  81 mg daily and clopidogrel  75 mg daily for 3 months and then ASA alone Therapy recommendations: none Disposition:  pending  Hx of Stroke/TIA In 04/2014, she was admitted for right PCA infarct with right P3 occlusion. TCD bubble study no PFO, cerebral angiogram no evidence of vasculitis. CSF showed WBC 2, protein 15, not consistent with vasculitis. TEE unremarkable. Hypercoag workup only showed low protein S activity. Patient was taken OCP prior to the stroke. 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 Hematology follow-up recommended. OCP was discontinued. She was discharged on aspirin  325 and Lipitor 20. Followed up with hematology Dr. 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 Ines at [facility]  Hypertension Home meds:  Lisinopril  Stable Long term BP goal normotensive  Hyperlipidemia LDL 91, goal < 70 Add atorvastatin  20 Continue statin at discharge  B12 deficiency B12 = 168 On supplement Homocysteine level pending  Other Stroke Risk Factors Obesity, Body mass index is 37.51 kg/m., BMI >/= 30 associated with increased stroke risk, recommend weight loss, diet and exercise as appropriate  Family history of blood clots (mother PAD with thrombus and brother with PE) On mirena  IUD Migraine - follows with Dr. Ines at [facility] OSA  Other medical issues Anxiety / panic attack  Hospital day # 1  Patient seen and examined by NP/APP with MD. MD to update note as needed.   Jorene Last, DNP, FNP-BC [facility] Pager: [phone]  ATTENDING NOTE: I reviewed above note and agree with the assessment and plan. Pt was seen and examined.   Husband at the bedside. Pt more awake alert today, eyes open, AAO x 3, following simple commands, and no aphasia. Still has some  left upper quadrant simultagnosia. Otherwise, neuro intact. Proteins S activity normal this time. Pending homocysteine level but B12 level low, on supplement. Pending TEE tomorrow. On DAPT and statin. PT and OT no recs. I had long discussion with pt and husband at bedside and mom over the phone, updated pt current condition, treatment plan and potential prognosis, and answered all the questions. They expressed understanding and appreciation.     For detailed assessment and plan, please refer to above as I have made changes wherever appropriate.   Ary Cummins, MD PhD Stroke Neurology 12/17/2023 9:29 PM  This patient is critically ill due to R M1 occlusion, R MCA stroke, history of stroke and at significant risk of neurological worsening, death form recurrent stroke, hemorrhagic transmission. This  patient's care requires constant monitoring of vital signs, hemodynamics, respiratory and cardiac monitoring, review of multiple databases, neurological assessment, discussion with family, other specialists and medical decision making of high complexity. I spent 40 minutes of neurocritical care time in the care of this patient. I had long discussion with husband at bedside, updated pt current condition, treatment plan and potential prognosis, and answered all the questions.  He expressed understanding and appreciation.     To contact Stroke Continuity provider, please refer to [email or web address]. After hours, contact General Neurology

## 2023-12-17 NOTE — Plan of Care (Signed)

## 2023-12-17 NOTE — Progress Notes (Addendum)
 STROKE TEAM PROGRESS NOTE   INTERIM HISTORY/SUBJECTIVE  Symptoms improving. Has some left visual neglect and sensory neglect. Plan to transfer out of ICU.   OBJECTIVE  CBC    Component Value Date/Time   WBC 7.9 12/16/2023 0858   RBC 4.22 12/16/2023 0858   HGB 11.8 (L) 12/16/2023 0858   HGB 10.3 (L) 01/08/2022 1014   HCT 37.1 12/16/2023 0858   HCT 32.2 (L) 01/08/2022 1014   PLT 359 12/16/2023 0858   PLT 350 01/08/2022 1014   MCV 87.9 12/16/2023 0858   MCV 81 01/08/2022 1014   MCH 28.0 12/16/2023 0858   MCHC 31.8 12/16/2023 0858   RDW 16.5 (H) 12/16/2023 0858   RDW 13.9 01/08/2022 1014   LYMPHSABS 3.0 12/16/2023 0858   LYMPHSABS 2.5 10/15/2021 0848   MONOABS 0.3 12/16/2023 0858   EOSABS 0.0 12/16/2023 0858   EOSABS 0.0 10/15/2021 0848   BASOSABS 0.0 12/16/2023 0858   BASOSABS 0.0 10/15/2021 0848    BMET    Component Value Date/Time   NA 139 12/16/2023 0858   NA 134 10/15/2021 0848   K 3.6 12/16/2023 0858   CL 107 12/16/2023 0858   CO2 23 12/16/2023 0858   GLUCOSE 117 (H) 12/16/2023 0858   BUN 9 12/16/2023 0858   BUN 8 10/15/2021 0848   CREATININE 0.55 12/16/2023 0858   CREATININE 0.71 07/17/2014 1558   CALCIUM  9.2 12/16/2023 0858   EGFR 123 10/15/2021 0848   GFRNONAA >60 12/16/2023 0858    IMAGING past 24 hours ECHOCARDIOGRAM COMPLETE Result Date: 12/16/2023    ECHOCARDIOGRAM REPORT   Patient Name:   Angel [name] Date of Exam: 12/16/2023 Medical Rec #:  [id number]             Height:       69.0 in Accession #:    [id number]            Weight:       254.0 lb Date of Birth:  [birth year]             BSA:          2.287 m Patient Age:    33 years              BP:           113/69 mmHg Patient Gender: F                     HR:           66 bpm. Exam Location:  Inpatient Procedure: 2D Echo, Cardiac Doppler and Color Doppler (Both Spectral and Color            Flow Doppler were utilized during procedure). Indications:    Stroke I63.9  History:        Patient has  prior history of Echocardiogram examinations, most                 recent 05/14/2014. Stroke and Migraine.  Sonographer:    Thea Norlander RCS Referring Phys: [id number] SRISHTI L BHAGAT IMPRESSIONS  1. Left ventricular ejection fraction, by estimation, is 60 to 65%. The left ventricle has normal function. The left ventricle has no regional wall motion abnormalities. Left ventricular diastolic parameters were normal.  2. Right ventricular systolic function is normal. The right ventricular size is normal.  3. The mitral valve is normal in structure. Mild mitral valve regurgitation.  4. The aortic valve is tricuspid. Aortic valve regurgitation is not  visualized.  5. The inferior vena cava is normal in size with greater than 50% respiratory variability, suggesting right atrial pressure of 3 mmHg. FINDINGS  Left Ventricle: Left ventricular ejection fraction, by estimation, is 60 to 65%. The left ventricle has normal function. The left ventricle has no regional wall motion abnormalities. The left ventricular internal cavity size was normal in size. There is  no left ventricular hypertrophy. Left ventricular diastolic parameters were normal. Right Ventricle: The right ventricular size is normal. Right vetricular wall thickness was not assessed. Right ventricular systolic function is normal. Left Atrium: Left atrial size was normal in size. Right Atrium: Right atrial size was normal in size. Pericardium: There is no evidence of pericardial effusion. Mitral Valve: The mitral valve is normal in structure. Mild mitral valve regurgitation. Tricuspid Valve: The tricuspid valve is normal in structure. Tricuspid valve regurgitation is trivial. Aortic Valve: The aortic valve is tricuspid. Aortic valve regurgitation is not visualized. Aortic valve peak gradient measures 10.1 mmHg. Pulmonic Valve: The pulmonic valve was normal in structure. Pulmonic valve regurgitation is not visualized. Aorta: The aortic root and ascending aorta are  structurally normal, with no evidence of dilitation. Venous: The inferior vena cava is normal in size with greater than 50% respiratory variability, suggesting right atrial pressure of 3 mmHg. IAS/Shunts: No atrial level shunt detected by color flow Doppler.  LEFT VENTRICLE PLAX 2D LVIDd:         5.00 cm   Diastology LVIDs:         3.10 cm   LV e' medial:    11.40 cm/s LV PW:         0.80 cm   LV E/e' medial:  9.6 LV IVS:        0.80 cm   LV e' lateral:   17.10 cm/s LVOT diam:     2.00 cm   LV E/e' lateral: 6.4 LV SV:         81 LV SV Index:   35 LVOT Area:     3.14 cm  RIGHT VENTRICLE             IVC RV S prime:     13.20 cm/s  IVC diam: 2.00 cm TAPSE (M-mode): 2.3 cm LEFT ATRIUM             Index        RIGHT ATRIUM           Index LA diam:        3.30 cm 1.44 cm/m   RA Area:     14.10 cm LA Vol (A2C):   43.0 ml 18.80 ml/m  RA Volume:   29.50 ml  12.90 ml/m LA Vol (A4C):   48.9 ml 21.38 ml/m LA Biplane Vol: 49.4 ml 21.60 ml/m  AORTIC VALVE AV Area (Vmax): 2.41 cm AV Vmax:        159.00 cm/s AV Peak Grad:   10.1 mmHg LVOT Vmax:      122.00 cm/s LVOT Vmean:     80.600 cm/s LVOT VTI:       0.257 m  AORTA Ao Root diam: 2.70 cm Ao Asc diam:  2.70 cm MITRAL VALVE MV Area (PHT): 2.80 cm     SHUNTS MV Decel Time: 271 msec     Systemic VTI:  0.26 m MV E velocity: 110.00 cm/s  Systemic Diam: 2.00 cm MV A velocity: 88.30 cm/s MV E/A ratio:  1.25 Angel Gull MD Electronically signed by Angel Gull MD Signature  Date/Time: 12/16/2023/7:53:08 PM    Final    VAS US  LOWER EXTREMITY VENOUS (DVT) Result Date: 12/16/2023  Lower Venous DVT Study Patient Name:  Angel [name]  Date of Exam:   12/16/2023 Medical Rec #: [id number]              Accession #:    [id number] Date of Birth: 12-26-1990              Patient Gender: F Patient Age:   33 years Exam Location:  [facility] Procedure:      VAS US  LOWER EXTREMITY VENOUS (DVT) Referring Phys: Angel [name]  --------------------------------------------------------------------------------  Indications: Stroke.  Performing Technologist: Angel [name], RVT  Examination Guidelines: A complete evaluation includes B-mode imaging, spectral Doppler, color Doppler, and power Doppler as needed of all accessible portions of each vessel. Bilateral testing is considered an integral part of a complete examination. Limited examinations for reoccurring indications may be performed as noted. The reflux portion of the exam is performed with the patient in reverse Trendelenburg.  +---------+---------------+---------+-----------+----------+--------------+ RIGHT    CompressibilityPhasicitySpontaneityPropertiesThrombus Aging +---------+---------------+---------+-----------+----------+--------------+ CFV      Full           Yes      Yes                                 +---------+---------------+---------+-----------+----------+--------------+ SFJ      Full                                                        +---------+---------------+---------+-----------+----------+--------------+ FV Prox  Full                                                        +---------+---------------+---------+-----------+----------+--------------+ FV Mid   Full                                                        +---------+---------------+---------+-----------+----------+--------------+ FV DistalFull                                                        +---------+---------------+---------+-----------+----------+--------------+ PFV      Full                                                        +---------+---------------+---------+-----------+----------+--------------+ POP      Full           Yes      Yes                                 +---------+---------------+---------+-----------+----------+--------------+  PTV      Full                                                         +---------+---------------+---------+-----------+----------+--------------+ PERO     Full                                                        +---------+---------------+---------+-----------+----------+--------------+   +---------+---------------+---------+-----------+----------+--------------+ LEFT     CompressibilityPhasicitySpontaneityPropertiesThrombus Aging +---------+---------------+---------+-----------+----------+--------------+ CFV      Full           Yes      Yes                                 +---------+---------------+---------+-----------+----------+--------------+ SFJ      Full                                                        +---------+---------------+---------+-----------+----------+--------------+ FV Prox  Full                                                        +---------+---------------+---------+-----------+----------+--------------+ FV Mid   Full                                                        +---------+---------------+---------+-----------+----------+--------------+ FV DistalFull                                                        +---------+---------------+---------+-----------+----------+--------------+ PFV      Full                                                        +---------+---------------+---------+-----------+----------+--------------+ POP      Full           Yes      Yes                                 +---------+---------------+---------+-----------+----------+--------------+ PTV      Full                                                        +---------+---------------+---------+-----------+----------+--------------+  PERO     Full                                                        +---------+---------------+---------+-----------+----------+--------------+    Summary: RIGHT: - There is no evidence of deep vein thrombosis in the lower extremity.  - No cystic structure found in  the popliteal fossa.  LEFT: - There is no evidence of deep vein thrombosis in the lower extremity.  - No cystic structure found in the popliteal fossa.  *See table(s) above for measurements and observations.    Preliminary    MR BRAIN WO CONTRAST Result Date: 12/16/2023 CLINICAL DATA:  Stroke, follow up. Headache and dizziness. Left-sided weakness and neglect. Right M1 occlusion and MCA infarct on CT. EXAM: MRI HEAD WITHOUT CONTRAST TECHNIQUE: Multiplanar, multiecho pulse sequences of the brain and surrounding structures were obtained without intravenous contrast. COMPARISON:  Head CT and CTA earlier today.  Head MRI 01/22/2021. FINDINGS: Brain: There is a moderate-sized acute right MCA infarct which corresponds relatively well to the visible infarct on today's earlier CT and is mildly less than the Tmax>6s volume on CTP. There is greatest involvement of the parietal lobe and insula with at the basal ganglia and frontal lobe involvement as well. There is corresponding T2 FLAIR hyperintensity/cytotoxic edema without significant mass effect or hemorrhage. A small chronic infarct is again noted in the medial right occipital lobe. The ventricles are normal in size. No mass, midline shift, or extra-axial fluid collection is identified. Vascular: Known right M1 occlusion from today's earlier CTA with abnormal signal in more distal MCA branch vessels. Skull and upper cervical spine: Chronically diminished bone marrow T1 signal intensity which is nonspecific but may be related to a history of anemia. Sinuses/Orbits: Unremarkable orbits. Paranasal sinuses and mastoid air cells are clear. Other: None. IMPRESSION: Moderate-sized acute right MCA infarct. No significant mass effect or hemorrhage. Electronically Signed   By: Dasie Hamburg M.D.   On: 12/16/2023 15:07   CT VENOGRAM HEAD Result Date: 12/16/2023 CLINICAL DATA:  Dural venous sinus thrombosis suspected. Headache, dizziness, and imbalance. History of stroke. EXAM:  CT VENOGRAM HEAD TECHNIQUE: Venographic phase images of the brain were obtained following the administration of intravenous contrast. Multiplanar reformats and maximum intensity projections were generated. RADIATION DOSE REDUCTION: This exam was performed according to the departmental dose-optimization program which includes automated exposure control, adjustment of the mA and/or kV according to patient size and/or use of iterative reconstruction technique. CONTRAST:  OMNIPAQUE  IOHEXOL  350 MG/ML SOLN COMPARISON:  CT venogram of the head 01/22/2021 FINDINGS: The superior sagittal sinus, internal cerebral veins, vein of Galen, straight sinus, transverse sinuses, sigmoid sinuses, and jugular bulbs are patent without evidence of thrombus or significant stenosis. IMPRESSION: Negative CT venogram. Electronically Signed   By: Dasie Hamburg M.D.   On: 12/16/2023 11:11   CT ANGIO HEAD NECK W WO CM W PERF (CODE STROKE) Result Date: 12/16/2023 CLINICAL DATA:  Headache, dizziness, and imbalance. History of stroke. EXAM: CT ANGIOGRAPHY HEAD AND NECK CT PERFUSION BRAIN TECHNIQUE: Multidetector CT imaging of the head and neck was performed using the standard protocol during bolus administration of intravenous contrast. Multiplanar CT image reconstructions and MIPs were obtained to evaluate the vascular anatomy. Carotid stenosis measurements (when applicable) are obtained utilizing NASCET criteria, using the distal internal carotid  diameter as the denominator. Multiphase CT imaging of the brain was performed following IV bolus contrast injection. Subsequent parametric perfusion maps were calculated using RAPID software. RADIATION DOSE REDUCTION: This exam was performed according to the departmental dose-optimization program which includes automated exposure control, adjustment of the mA and/or kV according to patient size and/or use of iterative reconstruction technique. CONTRAST:  OMNIPAQUE  IOHEXOL  350 MG/ML SOLN  COMPARISON:  CTA head and neck 01/22/2021 FINDINGS: CTA NECK FINDINGS Aortic arch: Standard branching. Widely patent brachiocephalic and subclavian arteries. Right carotid system: Patent without evidence of stenosis or dissection. Partially retropharyngeal course of the proximal ICA. Left carotid system: Patent without evidence of stenosis or dissection. Partially retropharyngeal course of the proximal ICA. Vertebral arteries: Patent and codominant without evidence of stenosis or dissection. Skeleton: No acute osseous abnormality or suspicious lesion. Other neck: Borderline enlarged bilateral cervical lymph nodes, similar to the 2022 CT and nonspecific. No neck mass. Upper chest: Clear lung apices. Review of the MIP images confirms the above findings CTA HEAD FINDINGS Anterior circulation: The internal carotid arteries are widely patent from skull base to carotid termini. There is a new distal right M1 occlusion with moderate distal collateralization. The left MCA and both ACAs are patent without evidence of a proximal branch occlusion or significant proximal stenosis. No aneurysm is identified. Posterior circulation: The intracranial vertebral arteries are widely patent to the basilar. Patent right PICA, left AICA, and bilateral SCA origins are visualized. The basilar artery is widely patent. There are small right and large left posterior communicating arteries with hypoplasia of the left P1 segment. Both PCAs are patent without evidence of a significant proximal stenosis. No aneurysm is identified. Venous sinuses: More fully evaluated on the separately reported contemporaneous CT venogram. Anatomic variants: Fetal left PCA. Review of the MIP images confirms the above findings CT Brain Perfusion Findings: ASPECTS: 6 CBF (<30%) Volume: 0 mL Perfusion (Tmax>6.0s) volume: 42 mL Mismatch Volume: The acute core infarct apparent on the preceding noncontrast head CT was not detected by automated rapid processing. Actual  mismatch volume/penumbra is estimated to be 20-30 mL. Infarction Location: Right MCA (parietal lobe and insula). Findings were discussed by telephone with Dr. Jerrie on 12/16/2023 at 10:25 a.m. IMPRESSION: 1. Distal right M1 occlusion. 2. Estimated mismatch volume/ischemic penumbra on CT perfusion of 20-30 mL. 3. Widely patent carotid and vertebral arteries. Electronically Signed   By: Dasie Hamburg M.D.   On: 12/16/2023 11:09    Vitals:   12/17/23 0630 12/17/23 0700 12/17/23 0800 12/17/23 0900  BP:  136/84 (!) 142/76 134/76  Pulse: 78 84 65 83  Resp: 15 16 14    Temp:   98.9 F (37.2 C)   TempSrc:   Oral   SpO2: 98% 97% 97% 98%  Weight:      Height:         PHYSICAL EXAM General:  Alert, well-nourished, well-developed patient in no acute distress Psych:  Mood and affect appropriate for situation CV: Regular rate and rhythm on monitor Respiratory:  Regular, unlabored respirations on room air GI: Abdomen soft and nontender   NEURO:  Mental Status: AA&Ox3, patient is able to give clear and coherent history Speech/Language: speech is without dysarthria or aphasia.  Naming, repetition, fluency, and comprehension intact.  Cranial Nerves:  II: PERRL. Visual fields full. Visual neglect on the left III, IV, VI: EOMI. Eyelids elevate symmetrically.  V: Sensation is intact to light touch and symmetrical to face.  VII: Face is symmetrical resting and  smiling VIII: hearing intact to voice. IX, X: Palate elevates symmetrically. Phonation is normal.  KP:Dynloizm shrug 5/5. XII: tongue is midline without fasciculations. Motor: 5/5 strength to all muscle groups tested.  Tone: is normal and bulk is normal Sensation- Intact to light touch bilaterally.  Sensory neglect improving Coordination: FTN intact bilaterally, HKS: no ataxia in BLE.No drift.  Gait- deferred  Most Recent NIH 2     ASSESSMENT/PLAN  Ms. Angel [name] is a 33 y.o. female with history of obesity, diabetes,  hypertension, anxiety/panic attack, migraine, OSA, stroke admitted for headache, dizziness and imbalance. CT showed acute right MCA infarct with hyperdense right MCA, chronic right occipital infarct.  CTA head and neck showed distal right M1 occlusion and relatively reserved right MCA collateral flow.  CTP 0/42 cc.  CTV neg. MRI pending. She had R PCA stroke in 04/2014, cryptogenic, and TIA in 01/2015. Initial concern of protein S deficiency but repeat normal level per report. Pt mild symptoms with questionable chronic MCA occlusion, not candidate for IR for now.  NIH on Admission 2  stroke:  right MCA moderate sized infarcts with R M1 occlusion, etiology: cryptogenic   Code Stroke CT head Acute right MCA infarct with hyperdense right MCA. ASPECTS of 6  CTA head & neck Distal right M1 occlusion.  CTP 0/42 cc MRI  Moderate-sized acute right MCA infarct. No significant mass effect or hemorrhage. DVT Negative TEE 7/25 pending 2D Echo EF 60-65%  LDL 91 HgbA1c 5.6 Hypercoagulable work up pending ANA neg UDS pending VTE prophylaxis - Lovenox  No antithrombotic prior to admission, now on aspirin  81 mg daily and clopidogrel  75 mg daily for 3 months and then ASA alone Therapy recommendations: none Disposition:  pending  Hx of Stroke/TIA In 04/2014, she was admitted for right PCA infarct with right P3 occlusion. TCD bubble study no PFO, cerebral angiogram no evidence of vasculitis. CSF showed WBC 2, protein 15, not consistent with vasculitis. TEE unremarkable. Hypercoag workup only showed low protein S activity. Patient was taken OCP prior to the stroke. 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 Hematology follow-up recommended. OCP was discontinued. She was discharged on aspirin  325 and Lipitor 20. Followed up with hematology Dr. 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 Ines at [facility]  Hypertension Home meds:  Lisinopril  Stable Long term BP goal normotensive  Hyperlipidemia LDL 91, goal < 70 Add atorvastatin  20 Continue statin at discharge  B12 deficiency B12 = 168 On supplement Homocysteine level pending  Other Stroke Risk Factors Obesity, Body mass index is 37.51 kg/m., BMI >/= 30 associated with increased stroke risk, recommend weight loss, diet and exercise as appropriate  Family history of blood clots (mother PAD with thrombus and brother with PE) On mirena  IUD Migraine - follows with Dr. Ines at [facility] OSA  Other medical issues Anxiety / panic attack  Hospital day # 1  Patient seen and examined by NP/APP with MD. MD to update note as needed.   Jorene Last, DNP, FNP-BC [facility] Pager: [phone]  ATTENDING NOTE: I reviewed above note and agree with the assessment and plan. Pt was seen and examined.   Husband at the bedside. Pt more awake alert today, eyes open, AAO x 3, following simple commands, and no aphasia. Still has some  left upper quadrant simultagnosia. Otherwise, neuro intact. Proteins S activity normal this time. Pending homocysteine level but B12 level low, on supplement. Pending TEE tomorrow. On DAPT and statin. PT and OT no recs. I had long discussion with pt and husband at bedside and mom over the phone, updated pt current condition, treatment plan and potential prognosis, and answered all the questions. They expressed understanding and appreciation.     For detailed assessment and plan, please refer to above as I have made changes wherever appropriate.   Angel Cummins, MD PhD Stroke Neurology 12/17/2023 9:29 PM  This patient is critically ill due to R M1 occlusion, R MCA stroke, history of stroke and at significant risk of neurological worsening, death form recurrent stroke, hemorrhagic transmission. This  patient's care requires constant monitoring of vital signs, hemodynamics, respiratory and cardiac monitoring, review of multiple databases, neurological assessment, discussion with family, other specialists and medical decision making of high complexity. I spent 40 minutes of neurocritical care time in the care of this patient. I had long discussion with husband at bedside, updated pt current condition, treatment plan and potential prognosis, and answered all the questions.  He expressed understanding and appreciation.     To contact Stroke Continuity provider, please refer to [email or web address]. After hours, contact General Neurology

## 2023-12-17 NOTE — Progress Notes (Signed)
   [facility] has been requested to perform a transesophageal echocardiogram on Angel [name] for a cerebral vascular accident.  Has a prior history of GERD.  Had a normal TEE and a negative bubble study following a prior CVA in 2015. Has a prior questionable diagnosis of protein S deficiency.  Initial protein S result was low (Abnormal).  Follow-up protein S was normal twice.  Repeat protein S pending. There was a prior concern of OSA but has no formal diagnosis of OSA.   The patient does NOT have any absolute or relative contraindications to a Transesophageal Echocardiogram (TEE).  The patient has: No other conditions that may impact this procedure.   After careful review of history and examination, the risks and benefits of transesophageal echocardiogram have been explained including risks of esophageal damage, perforation (1:10,000 risk), bleeding, pharyngeal hematoma as well as other potential complications associated with conscious sedation including aspiration, arrhythmia, respiratory failure and death. Alternatives to treatment were discussed, questions were answered. Patient is willing to proceed.   Bonney Morse Clause, PA-C  12/17/2023 1:41 PM

## 2023-12-17 NOTE — Plan of Care (Signed)
 Patient NiH score 0 as of midnight assessment and no apparent residual symptoms. Patient able to call for bedside commode and use with only supervision, no contact.

## 2023-12-17 NOTE — Progress Notes (Signed)
 Physical Therapy Evaluation and DC Patient Details Name: Angel [name] MRN: [id number] DOB: 1990/06/01 Today's Date: 12/17/2023  History of Present Illness  33 yo female presents to [facility] [place] [facility] on 12/16/23 for headache, dizziness, and imbalance. 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  Clinical Impression  Pt agreeable to therapy. Pt independent prior to admission. Pt is at functional baseline and able to ambulate 200 ft independently and walk up and down stairs with no signs of LOB. Educated on stroke symptoms with BEFAST acronym. No further PT is recommended at this time, PT to sign off.        If plan is discharge home, recommend the following:     Can travel by private vehicle        Equipment Recommendations None recommended by PT  Recommendations for Other Services       Functional Status Assessment Patient has not had a recent decline in their functional status     Precautions / Restrictions Precautions Precautions: None Restrictions Weight Bearing Restrictions Per Provider Order: No      Mobility  Bed Mobility Overal bed mobility: Independent                  Transfers Overall transfer level: Independent Equipment used: None                    Ambulation/Gait Ambulation/Gait assistance: Independent, Supervision Gait Distance (Feet): 200 Feet Assistive device: None Gait Pattern/deviations: WFL(Within Functional Limits)          Stairs Stairs: Yes Stairs assistance: Supervision, Independent   Number of Stairs: 12 General stair comments: Step over step  Wheelchair Mobility     Tilt Bed    Modified Rankin (Stroke Patients Only) Modified Rankin (Stroke Patients Only) Pre-Morbid Rankin Score: No symptoms Modified Rankin: No symptoms     Balance Overall balance assessment: Independent                                            Pertinent Vitals/Pain Pain Assessment Pain Assessment: No/denies pain    Home Living Family/patient expects to be discharged to:: Private residence Living Arrangements: Spouse/significant other Available Help at Discharge: Family Type of Home: Apartment Home Access: Stairs to enter Entrance Stairs-Rails: Can reach both Entrance Stairs-Number of Steps: 20   Home Layout: One level Home Equipment: None      Prior Function Prior Level of Function : Independent/Modified Independent               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     Extremity/Trunk Assessment   Upper Extremity Assessment Upper Extremity Assessment: Overall WFL for tasks assessed    Lower Extremity Assessment Lower Extremity Assessment: Overall WFL for tasks assessed    Cervical / Trunk Assessment Cervical / Trunk Assessment: Normal  Communication   Communication Communication: No apparent difficulties    Cognition Arousal: Alert Behavior During Therapy: WFL for tasks assessed/performed   PT - Cognitive impairments: No apparent impairments                         Following commands: Intact       Cueing Cueing Techniques: Verbal cues, Gestural cues     General Comments General comments (skin integrity,  edema, etc.): VSS on RA    Exercises     Assessment/Plan    PT Assessment Patient does not need any further PT services  PT Problem List         PT Treatment Interventions      PT Goals (Current goals can be found in the Care Plan section)  Acute Rehab PT Goals Patient Stated Goal: Go home PT Goal Formulation: With patient Time For Goal Achievement: 12/31/23 Potential to Achieve Goals: Good    Frequency       Co-evaluation               AM-PAC PT 6 Clicks Mobility  Outcome Measure Help needed turning from your back to your side while in a flat bed without using bedrails?: None Help needed moving from lying on your  back to sitting on the side of a flat bed without using bedrails?: None Help needed moving to and from a bed to a chair (including a wheelchair)?: None Help needed standing up from a chair using your arms (e.g., wheelchair or bedside chair)?: None Help needed to walk in hospital room?: None Help needed climbing 3-5 steps with a railing? : None 6 Click Score: 24    End of Session Equipment Utilized During Treatment: Gait belt Activity Tolerance: Patient tolerated treatment well Patient left: in bed;with call bell/phone within reach (Not a falls risk and is at functional baseline) Nurse Communication: Mobility status PT Visit Diagnosis: Difficulty in walking, not elsewhere classified (R26.2)    Time: 9076-9059 PT Time Calculation (min) (ACUTE ONLY): 17 min   Charges:   PT Evaluation $PT Eval Low Complexity: 1 Low   PT General Charges $$ ACUTE PT VISIT: 1 Visit         Quintin Campi, SPT  Acute Rehab  [phone]   Quintin Campi 12/17/2023, 10:02 AM

## 2023-12-18 ENCOUNTER — Inpatient Hospital Stay (HOSPITAL_COMMUNITY)

## 2023-12-18 ENCOUNTER — Encounter (HOSPITAL_COMMUNITY): Payer: Self-pay | Admitting: Cardiology

## 2023-12-18 ENCOUNTER — Inpatient Hospital Stay (HOSPITAL_COMMUNITY): Admitting: Anesthesiology

## 2023-12-18 ENCOUNTER — Encounter (HOSPITAL_COMMUNITY)
Admission: EM | Disposition: A | Discharge status: Home / Self Care | Payer: Self-pay | Source: Home / Self Care | Attending: Neurology

## 2023-12-18 DIAGNOSIS — I639 Cerebral infarction, unspecified: Secondary | ICD-10-CM | POA: Diagnosis not present

## 2023-12-18 DIAGNOSIS — I340 Nonrheumatic mitral (valve) insufficiency: Secondary | ICD-10-CM

## 2023-12-18 DIAGNOSIS — Z6837 Body mass index (BMI) 37.0-37.9, adult: Secondary | ICD-10-CM

## 2023-12-18 DIAGNOSIS — I6389 Other cerebral infarction: Secondary | ICD-10-CM | POA: Diagnosis not present

## 2023-12-18 DIAGNOSIS — R29702 NIHSS score 2: Secondary | ICD-10-CM | POA: Diagnosis not present

## 2023-12-18 DIAGNOSIS — I63511 Cerebral infarction due to unspecified occlusion or stenosis of right middle cerebral artery: Secondary | ICD-10-CM | POA: Diagnosis not present

## 2023-12-18 LAB — CBC
HCT: 33 % — ABNORMAL LOW (ref 36.0–46.0)
Hemoglobin: 10.7 g/dL — ABNORMAL LOW (ref 12.0–15.0)
MCH: 28 pg (ref 26.0–34.0)
MCHC: 32.4 g/dL (ref 30.0–36.0)
MCV: 86.4 fL (ref 80.0–100.0)
Platelets: 328 K/uL (ref 150–400)
RBC: 3.82 MIL/uL — ABNORMAL LOW (ref 3.87–5.11)
RDW: 16.5 % — ABNORMAL HIGH (ref 11.5–15.5)
WBC: 6.4 K/uL (ref 4.0–10.5)
nRBC: 0 % (ref 0.0–0.2)

## 2023-12-18 LAB — CARDIOLIPIN ANTIBODIES, IGG, IGM, IGA
Anticardiolipin IgA: <9 U/mL (ref 0–11)
Anticardiolipin IgG: <9 GPL U/mL (ref 0–14)
Anticardiolipin IgM: <9 [MPL'U]/mL (ref 0–12)

## 2023-12-18 LAB — HOMOCYSTEINE: Homocysteine: 12.9 umol/L (ref 0.0–14.5)

## 2023-12-18 LAB — BASIC METABOLIC PANEL WITH GFR
Anion gap: 11 (ref 5–15)
BUN: 8 mg/dL (ref 6–20)
CO2: 21 mmol/L — ABNORMAL LOW (ref 22–32)
Calcium: 9 mg/dL (ref 8.9–10.3)
Chloride: 107 mmol/L (ref 98–111)
Creatinine, Ser: 0.59 mg/dL (ref 0.44–1.00)
GFR, Estimated: >60 mL/min (ref >60)
Glucose, Bld: 102 mg/dL — ABNORMAL HIGH (ref 70–99)
Potassium: 3.4 mmol/L — ABNORMAL LOW (ref 3.5–5.1)
Sodium: 139 mmol/L (ref 135–145)

## 2023-12-18 LAB — ECHO TEE

## 2023-12-18 LAB — FACTOR 5 LEIDEN

## 2023-12-18 LAB — PROTEIN C, TOTAL: Protein C, Total: 70 % (ref 60–150)

## 2023-12-18 LAB — GLUCOSE, CAPILLARY: Glucose-Capillary: 98 mg/dL (ref 70–99)

## 2023-12-18 SURGERY — TRANSESOPHAGEAL ECHOCARDIOGRAM (TEE) (CATHLAB)
Anesthesia: Monitor Anesthesia Care

## 2023-12-18 MED ORDER — EPHEDRINE SULFATE-NACL 50-0.9 MG/10ML-% IV SOSY: PREFILLED_SYRINGE | INTRAVENOUS | Status: DC | PRN

## 2023-12-18 MED ORDER — ATORVASTATIN CALCIUM 20 MG PO TABS: 20.0000 mg | ORAL_TABLET | Freq: Every day | ORAL | 1 refills | Status: AC

## 2023-12-18 MED ORDER — ONDANSETRON HCL 4 MG/2ML IJ SOLN: 4.0000 mg | Freq: Four times a day (QID) | INTRAMUSCULAR | Status: DC | PRN

## 2023-12-18 MED ORDER — OXYCODONE HCL 5 MG/5ML PO SOLN: 5.0000 mg | Freq: Once | ORAL | Status: DC | PRN

## 2023-12-18 MED ORDER — PROPOFOL 500 MG/50ML IV EMUL
INTRAVENOUS | Status: DC | PRN
  Administered 2023-12-18: 150 ug/kg/min via INTRAVENOUS

## 2023-12-18 MED ORDER — MENTHOL 3 MG MT LOZG: 1.0000 | LOZENGE | OROMUCOSAL | Status: DC | PRN

## 2023-12-18 MED ORDER — FENTANYL CITRATE (PF) 100 MCG/2ML IJ SOLN: 25.0000 ug | INTRAMUSCULAR | Status: DC | PRN

## 2023-12-18 MED ORDER — SODIUM CHLORIDE 0.9 % IV SOLN: INTRAVENOUS | Status: DC

## 2023-12-18 MED ORDER — ASPIRIN 81 MG PO TBEC: 81.0000 mg | DELAYED_RELEASE_TABLET | Freq: Every day | ORAL | 12 refills | Status: AC

## 2023-12-18 MED ORDER — PROPOFOL 10 MG/ML IV BOLUS
INTRAVENOUS | Status: DC | PRN
  Administered 2023-12-18 (×6): 50 mg via INTRAVENOUS
  Administered 2023-12-18: 100 mg via INTRAVENOUS
  Administered 2023-12-18: 50 mg via INTRAVENOUS

## 2023-12-18 MED ORDER — CLOPIDOGREL BISULFATE 75 MG PO TABS: 75.0000 mg | ORAL_TABLET | Freq: Every day | ORAL | 1 refills | Status: AC

## 2023-12-18 MED ORDER — CYANOCOBALAMIN 1000 MCG PO TABS: 1000.0000 ug | ORAL_TABLET | Freq: Every day | ORAL | 1 refills | Status: AC

## 2023-12-18 MED ORDER — LIDOCAINE 2% (20 MG/ML) 5 ML SYRINGE
INTRAMUSCULAR | Status: DC | PRN
  Administered 2023-12-18: 100 mg via INTRAVENOUS

## 2023-12-18 MED ORDER — OXYCODONE HCL 5 MG PO TABS: 5.0000 mg | ORAL_TABLET | Freq: Once | ORAL | Status: DC | PRN

## 2023-12-18 NOTE — Anesthesia Preprocedure Evaluation (Signed)
 Anesthesia Evaluation  Patient identified by MRN, date of birth, ID band Patient awake    Reviewed: Allergy & Precautions, H&P , NPO status , Patient's Chart, lab work & pertinent test results  Airway Mallampati: II   Neck ROM: full    Dental   Pulmonary neg pulmonary ROS, former smoker   breath sounds clear to auscultation       Cardiovascular  Rhythm:regular Rate:Normal     Neuro/Psych  Headaches  Anxiety     CVA    GI/Hepatic   Endo/Other    Renal/GU      Musculoskeletal   Abdominal   Peds  Hematology Protein S deficiency   Anesthesia Other Findings   Reproductive/Obstetrics                              Anesthesia Physical Anesthesia Plan  ASA: 2  Anesthesia Plan: MAC   Post-op Pain Management:    Induction: Intravenous  PONV Risk Score and Plan: 2 and Propofol infusion and Treatment may vary due to age or medical condition  Airway Management Planned: Nasal Cannula  Additional Equipment:   Intra-op Plan:   Post-operative Plan:   Informed Consent: I have reviewed the patients History and Physical, chart, labs and discussed the procedure including the risks, benefits and alternatives for the proposed anesthesia with the patient or authorized representative who has indicated his/her understanding and acceptance.     Dental advisory given  Plan Discussed with: CRNA, Anesthesiologist and Surgeon  Anesthesia Plan Comments:         Anesthesia Quick Evaluation

## 2023-12-18 NOTE — TOC Transition Note (Signed)
 Transition of Care [facility]) - Discharge Note   Patient Details  Name: Angel [name] MRN: [id number] Date of Birth: [birth year]  [facility] CM/SW Contact:  Andrez JULIANNA George, RN Phone Number: 12/18/2023, 2:54 PM   Clinical Narrative:     Pt is discharging home with self care. No needs per IP Care Management.  Final next level of care: Home/Self Care Barriers to Discharge: No Barriers Identified   Patient Goals and CMS Choice            Discharge Placement                       Discharge Plan and Services Additional resources added to the After Visit Summary for                                       Social Drivers of Health (SDOH) Interventions SDOH Screenings   Food Insecurity: No Food Insecurity (12/18/2023)  Housing: Low Risk  (12/18/2023)  Transportation Needs: No Transportation Needs (12/18/2023)  Utilities: Not At Risk (12/18/2023)  Depression (PHQ2-9): Low Risk  (03/04/2022)  Recent Concern: Depression (PHQ2-9) - Medium Risk (01/08/2022)  Tobacco Use: Medium Risk (12/16/2023)     Readmission Risk Interventions     No data to display

## 2023-12-18 NOTE — Progress Notes (Signed)
 TCD with bubbles has been completed. Preliminary results can be found in CV Proc through chart review.   12/18/23 2:15 PM Cathlyn Collet RVT

## 2023-12-18 NOTE — Transfer of Care (Signed)
 Immediate Anesthesia Transfer of Care Note  Patient: Angel [name]  Procedure(s) Performed: TRANSESOPHAGEAL ECHOCARDIOGRAM  Patient Location: Cath Lab  Anesthesia Type:MAC  Level of Consciousness: awake, alert , and oriented  Airway & Oxygen Therapy: Patient Spontanous Breathing  Post-op Assessment: Report given to RN and Post -op Vital signs reviewed and stable  Post vital signs: Reviewed and stable  Last Vitals:  Vitals Value Taken Time  BP 123/79 12/18/23 09:00  Temp    Pulse 96 12/18/23 09:01  Resp 23 12/18/23 09:01  SpO2 98 % 12/18/23 09:01  Vitals shown include unfiled device data.  Last Pain:  Vitals:   12/18/23 0717  TempSrc: Temporal  PainSc: 0-No pain         Complications: No notable events documented.

## 2023-12-18 NOTE — Progress Notes (Signed)
 Pt discharged home with spouse in stable condition. Discharge instructions given and scripts sent to pharmacy of choice. No immediate questions or concerns at this time. Discharged from unit via wheelchair

## 2023-12-18 NOTE — Progress Notes (Signed)
  Echocardiogram Echocardiogram Transesophageal has been performed.  Devora Ellouise SAUNDERS 12/18/2023, 9:04 AM

## 2023-12-18 NOTE — Interval H&P Note (Signed)
 History and Physical Interval Note:  12/18/2023 8:04 AM  Angel [name]  has presented today for surgery, with the diagnosis of CVA.  The various methods of treatment have been discussed with the patient and family. After consideration of risks, benefits and other options for treatment, the patient has consented to  Procedure(s): TRANSESOPHAGEAL ECHOCARDIOGRAM (N/A) as a surgical intervention.  The patient's history has been reviewed, patient examined, no change in status, stable for surgery.  I have reviewed the patient's chart and labs.  Questions were answered to the patient's satisfaction.    Informed Consent   Shared Decision Making/Informed Consent   The risks [esophageal damage, perforation (1:10,000 risk), bleeding, pharyngeal hematoma as well as other potential complications associated with conscious sedation including aspiration, arrhythmia, respiratory failure and death], benefits (treatment guidance and diagnostic support) and alternatives of a transesophageal echocardiogram were discussed in detail with [name] and she is willing to proceed.       Madonna Michele HAS, [facility]  A Division of [facility] [address], [place], [place] [address]  [place], [place] [address] 8:04 AM

## 2023-12-18 NOTE — CV Procedure (Addendum)
    TRANSESOPHAGEAL ECHOCARDIOGRAM   NAME:  Angel [name]    MRN: [id number] DOB:  [birth year]    ADMIT DATE: 12/16/2023  INDICATIONS: CVA  PROCEDURE:   Informed consent was obtained prior to the procedure. The risks, benefits and alternatives for the procedure were discussed and the patient comprehended these risks.  Risks include, but are not limited to, cough, sore throat, vomiting, nausea, somnolence, esophageal and stomach trauma or perforation, bleeding, low blood pressure, aspiration, pneumonia, infection, trauma to the teeth and death.    Procedural time out performed.   Anesthesia was administered by anesthesia team (see their records).  The transesophageal probe was inserted in the esophagus and stomach without difficulty and multiple views were obtained.   COMPLICATIONS:    Copious amount of secretions under anesthesia. There were no immediate complications.  KEY FINDINGS:  LVEF 60-65%. Mild mitral regurgitation Small patent foreman ovale, likely located anteriorly, more pronounced with coughing and Valsalva maneuver.  Due to the copious amount of secretions overall accuracy is limited with regards to anatomical features.  Would recommend transcranial Doppler to supplemental findings. Full report to follow. Further management per primary team.  Spoke to the husband post-procedure  Angel Large, DO, [facility]  A Division of [facility] [address] [place], [place] [address]  [place], [place] [address] Pager: [phone] Office: [phone]

## 2023-12-18 NOTE — Discharge Summary (Addendum)
 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, etiology: cryptogenic   Secondary diagnosis Hx of stroke HTN HLD B12 deficiency Obesity Migraine  OSA  Patient Active Problem List   Diagnosis Date Noted   Acute ischemic right MCA stroke (HCC) 12/16/2023   Unwanted fertility    GDM (gestational diabetes mellitus) 03/14/2022   Gestational diabetes    BMI 45.0-49.9, adult (HCC) 11/14/2021   Prediabetes 10/17/2021   Supervision of high risk pregnancy, antepartum 08/27/2021   Protein S deficiency affecting pregnancy, antepartum (HCC) 09/12/2019   History of pre-eclampsia in prior pregnancy, currently pregnant 09/12/2019   Maternal morbid obesity, antepartum (HCC) 09/12/2019   Vitamin D deficiency 02/12/2015   Panic attacks 02/09/2015   Anxiety state 02/09/2015   Frequent headaches 02/09/2015   Protein S deficiency (HCC)    red chart,History of ischemic stroke x 2 in 2015 05/13/2014   Cerebral infarction (HCC) 05/13/2014   Anemia    Migraine 03/13/2014   Morbid obesity (HCC) 03/13/2014     Allergies as of 12/18/2023   No Known Allergies      Medication List     STOP taking these medications    enoxaparin  150 MG/ML injection Commonly known as: LOVENOX    triamcinolone  cream 0.1 % Commonly known as: KENALOG        TAKE these medications    ALPRAZolam  1 MG tablet Commonly known as: XANAX  Take 1 mg by mouth daily as needed for anxiety or sleep.   aspirin  EC 81 MG tablet Take 1 tablet (81 mg total) by mouth daily. Swallow whole. Start taking on: December 19, 2023   atorvastatin  20 MG tablet Commonly known as: LIPITOR Take 1 tablet (20 mg total) by mouth daily. Start  taking on: December 19, 2023   clopidogrel  75 MG tablet Commonly known as: PLAVIX  Take 1 tablet (75 mg total) by mouth daily. Start taking on: December 19, 2023   cyanocobalamin  1000 MCG tablet Take 1 tablet (1,000 mcg total) by mouth daily. Start taking on: December 24, 2023   cyclobenzaprine  10 MG tablet Commonly known as: FLEXERIL  Take 1 tablet (10 mg total) by mouth 3 (three) times daily as needed for muscle spasms.   fluticasone  50 MCG/ACT nasal spray Commonly known as: FLONASE  Place 2 sprays into both nostrils daily.   furosemide  20 MG tablet Commonly known as: Lasix  Take 1 tablet (20 mg total) by mouth daily.   lisinopril 5 MG tablet Commonly known as: ZESTRIL Take 5 mg by mouth daily.   metFORMIN  500 MG tablet Commonly known as: GLUCOPHAGE  Take 500 mg by mouth 2 (two) times daily with a meal.        LABORATORY STUDIES CBC    Component Value Date/Time   WBC 6.4 12/18/2023 0350   RBC 3.82 (L) 12/18/2023 0350   HGB 10.7 (L) 12/18/2023 0350   HGB 10.3 (L) 01/08/2022 1014   HCT 33.0 (L) 12/18/2023 0350   HCT 32.2 (L) 01/08/2022 1014   PLT 328 12/18/2023 0350   PLT 350 01/08/2022 1014   MCV 86.4 12/18/2023 0350   MCV 81 01/08/2022 1014   MCH 28.0 12/18/2023 0350   MCHC 32.4 12/18/2023 0350  RDW 16.5 (H) 12/18/2023 0350   RDW 13.9 01/08/2022 1014   LYMPHSABS 3.0 12/16/2023 0858   LYMPHSABS 2.5 10/15/2021 0848   MONOABS 0.3 12/16/2023 0858   EOSABS 0.0 12/16/2023 0858   EOSABS 0.0 10/15/2021 0848   BASOSABS 0.0 12/16/2023 0858   BASOSABS 0.0 10/15/2021 0848   CMP    Component Value Date/Time   NA 139 12/18/2023 0350   NA 134 10/15/2021 0848   K 3.4 (L) 12/18/2023 0350   CL 107 12/18/2023 0350   CO2 21 (L) 12/18/2023 0350   GLUCOSE 102 (H) 12/18/2023 0350   BUN 8 12/18/2023 0350   BUN 8 10/15/2021 0848   CREATININE 0.59 12/18/2023 0350   CREATININE 0.71 07/17/2014 1558   CALCIUM  9.0 12/18/2023 0350   PROT 7.9 12/16/2023 0858   PROT 7.1 10/15/2021 0848    ALBUMIN 3.6 12/16/2023 0858   ALBUMIN 3.5 (L) 10/15/2021 0848   AST 15 12/16/2023 0858   ALT 15 12/16/2023 0858   ALKPHOS 76 12/16/2023 0858   BILITOT 0.7 12/16/2023 0858   BILITOT <0.2 10/15/2021 0848   GFRNONAA >60 12/18/2023 0350   GFRAA 146 08/12/2019 1026   COAGS Lab Results  Component Value Date   INR 1.0 12/16/2023   INR 1.1 12/24/2019   INR 1.07 05/16/2014   Lipid Panel    Component Value Date/Time   CHOL 142 12/17/2023 0600   TRIG 66 12/17/2023 0600   HDL 38 (L) 12/17/2023 0600   CHOLHDL 3.7 12/17/2023 0600   VLDL 13 12/17/2023 0600   LDLCALC 91 12/17/2023 0600   HgbA1C  Lab Results  Component Value Date   HGBA1C 5.6 12/16/2023   Alcohol Level No results found for: [facility]   SIGNIFICANT DIAGNOSTIC STUDIES VAS US  TRANSCRANIAL DOPPLER W BUBBLES Result Date: 12/18/2023  Transcranial Doppler with Bubble Patient Name:  Angel [name]  Date of Exam:   12/18/2023 Medical Rec #: [id number]              Accession #:    [id number] Date of Birth: [birth year]              Patient Gender: F Patient Age:   33 years Exam Location:  [facility] Procedure:      VAS US  TRANSCRANIAL DOPPLER W BUBBLES Referring Phys: Angel [name] --------------------------------------------------------------------------------  Indications: Stroke. Comparison Study: No prior studies. Performing Technologist: Angel [name] RVT  Examination Guidelines: A complete evaluation includes B-mode imaging, spectral Doppler, color Doppler, and power Doppler as needed of all accessible portions of each vessel. Bilateral testing is considered an integral part of a complete examination. Limited examinations for reoccurring indications may be performed as noted.  Summary:  A vascular evaluation was performed. The right middle cerebral artery was studied. An IV was inserted into the patient's left Cephalic. Verbal informed consent was obtained.  No HITS detected. Negative for PFO. *See table(s) above for TCD  measurements and observations.    Preliminary    EP STUDY Result Date: 12/18/2023 See surgical note for result.  VAS US  LOWER EXTREMITY VENOUS (DVT) Result Date: 12/17/2023  Lower Venous DVT Study Patient Name:  Angel [facility]  Date of Exam:   12/16/2023 Medical Rec #: [id number]              Accession #:    [id number] Date of Birth: 24-Dec-1990              Patient Gender: F Patient Age:   26 years Exam Location:  [facility] Procedure:      VAS US  LOWER EXTREMITY VENOUS (DVT) Referring Phys: ARY Ceceilia Cephus --------------------------------------------------------------------------------  Indications: Stroke.  Performing Technologist: Elmarie Lindau, RVT  Examination Guidelines: A complete evaluation includes B-mode imaging, spectral Doppler, color Doppler, and power Doppler as needed of all accessible portions of each vessel. Bilateral testing is considered an integral part of a complete examination. Limited examinations for reoccurring indications may be performed as noted. The reflux portion of the exam is performed with the patient in reverse Trendelenburg.  +---------+---------------+---------+-----------+----------+--------------+ RIGHT    CompressibilityPhasicitySpontaneityPropertiesThrombus Aging +---------+---------------+---------+-----------+----------+--------------+ CFV      Full           Yes      Yes                                 +---------+---------------+---------+-----------+----------+--------------+ SFJ      Full                                                        +---------+---------------+---------+-----------+----------+--------------+ FV Prox  Full                                                        +---------+---------------+---------+-----------+----------+--------------+ FV Mid   Full                                                        +---------+---------------+---------+-----------+----------+--------------+ FV DistalFull                                                         +---------+---------------+---------+-----------+----------+--------------+ PFV      Full                                                        +---------+---------------+---------+-----------+----------+--------------+ POP      Full           Yes      Yes                                 +---------+---------------+---------+-----------+----------+--------------+ PTV      Full                                                        +---------+---------------+---------+-----------+----------+--------------+ PERO     Full                                                        +---------+---------------+---------+-----------+----------+--------------+   +---------+---------------+---------+-----------+----------+--------------+  LEFT     CompressibilityPhasicitySpontaneityPropertiesThrombus Aging +---------+---------------+---------+-----------+----------+--------------+ CFV      Full           Yes      Yes                                 +---------+---------------+---------+-----------+----------+--------------+ SFJ      Full                                                        +---------+---------------+---------+-----------+----------+--------------+ FV Prox  Full                                                        +---------+---------------+---------+-----------+----------+--------------+ FV Mid   Full                                                        +---------+---------------+---------+-----------+----------+--------------+ FV DistalFull                                                        +---------+---------------+---------+-----------+----------+--------------+ PFV      Full                                                        +---------+---------------+---------+-----------+----------+--------------+ POP      Full           Yes      Yes                                  +---------+---------------+---------+-----------+----------+--------------+ PTV      Full                                                        +---------+---------------+---------+-----------+----------+--------------+ PERO     Full                                                        +---------+---------------+---------+-----------+----------+--------------+     Summary: RIGHT: - There is no evidence of deep vein thrombosis in the lower extremity.  - No cystic structure found in the popliteal fossa.  LEFT: - There is no evidence of deep vein thrombosis in the lower extremity.  - No  cystic structure found in the popliteal fossa.  *See table(s) above for measurements and observations. Electronically signed by Debby Robertson on 12/17/2023 at 11:06:41 AM.    Final    ECHOCARDIOGRAM COMPLETE Result Date: 12/16/2023    ECHOCARDIOGRAM REPORT   Patient Name:   [facility] Date of Exam: 12/16/2023 Medical Rec #:  [id number]             Height:       69.0 in Accession #:    [id number]            Weight:       254.0 lb Date of Birth:  02-16-1991             BSA:          2.287 m Patient Age:    33 years              BP:           113/69 mmHg Patient Gender: F                     HR:           66 bpm. Exam Location:  Inpatient Procedure: 2D Echo, Cardiac Doppler and Color Doppler (Both Spectral and Color            Flow Doppler were utilized during procedure). Indications:    Stroke I63.9  History:        Patient has prior history of Echocardiogram examinations, most                 recent 05/14/2014. Stroke and Migraine.  Sonographer:    Thea Norlander RCS Referring Phys: [id number] SRISHTI L BHAGAT IMPRESSIONS  1. Left ventricular ejection fraction, by estimation, is 60 to 65%. The left ventricle has normal function. The left ventricle has no regional wall motion abnormalities. Left ventricular diastolic parameters were normal.  2. Right ventricular systolic function is normal. The right  ventricular size is normal.  3. The mitral valve is normal in structure. Mild mitral valve regurgitation.  4. The aortic valve is tricuspid. Aortic valve regurgitation is not visualized.  5. The inferior vena cava is normal in size with greater than 50% respiratory variability, suggesting right atrial pressure of 3 mmHg. FINDINGS  Left Ventricle: Left ventricular ejection fraction, by estimation, is 60 to 65%. The left ventricle has normal function. The left ventricle has no regional wall motion abnormalities. The left ventricular internal cavity size was normal in size. There is  no left ventricular hypertrophy. Left ventricular diastolic parameters were normal. Right Ventricle: The right ventricular size is normal. Right vetricular wall thickness was not assessed. Right ventricular systolic function is normal. Left Atrium: Left atrial size was normal in size. Right Atrium: Right atrial size was normal in size. Pericardium: There is no evidence of pericardial effusion. Mitral Valve: The mitral valve is normal in structure. Mild mitral valve regurgitation. Tricuspid Valve: The tricuspid valve is normal in structure. Tricuspid valve regurgitation is trivial. Aortic Valve: The aortic valve is tricuspid. Aortic valve regurgitation is not visualized. Aortic valve peak gradient measures 10.1 mmHg. Pulmonic Valve: The pulmonic valve was normal in structure. Pulmonic valve regurgitation is not visualized. Aorta: The aortic root and ascending aorta are structurally normal, with no evidence of dilitation. Venous: The inferior vena cava is normal in size with greater than 50% respiratory variability, suggesting right atrial pressure of 3 mmHg. IAS/Shunts: No atrial level shunt detected by color  flow Doppler.  LEFT VENTRICLE PLAX 2D LVIDd:         5.00 cm   Diastology LVIDs:         3.10 cm   LV e' medial:    11.40 cm/s LV PW:         0.80 cm   LV E/e' medial:  9.6 LV IVS:        0.80 cm   LV e' lateral:   17.10 cm/s LVOT  diam:     2.00 cm   LV E/e' lateral: 6.4 LV SV:         81 LV SV Index:   35 LVOT Area:     3.14 cm  RIGHT VENTRICLE             IVC RV S prime:     13.20 cm/s  IVC diam: 2.00 cm TAPSE (M-mode): 2.3 cm LEFT ATRIUM             Index        RIGHT ATRIUM           Index LA diam:        3.30 cm 1.44 cm/m   RA Area:     14.10 cm LA Vol (A2C):   43.0 ml 18.80 ml/m  RA Volume:   29.50 ml  12.90 ml/m LA Vol (A4C):   48.9 ml 21.38 ml/m LA Biplane Vol: 49.4 ml 21.60 ml/m  AORTIC VALVE AV Area (Vmax): 2.41 cm AV Vmax:        159.00 cm/s AV Peak Grad:   10.1 mmHg LVOT Vmax:      122.00 cm/s LVOT Vmean:     80.600 cm/s LVOT VTI:       0.257 m  AORTA Ao Root diam: 2.70 cm Ao Asc diam:  2.70 cm MITRAL VALVE MV Area (PHT): 2.80 cm     SHUNTS MV Decel Time: 271 msec     Systemic VTI:  0.26 m MV E velocity: 110.00 cm/s  Systemic Diam: 2.00 cm MV A velocity: 88.30 cm/s MV E/A ratio:  1.25 Vina Gull [name] Electronically signed by Vina Gull [name] Signature Date/Time: 12/16/2023/7:53:08 PM    Final    MR BRAIN WO CONTRAST Result Date: 12/16/2023 CLINICAL DATA:  Stroke, follow up. Headache and dizziness. Left-sided weakness and neglect. Right M1 occlusion and MCA infarct on CT. EXAM: MRI HEAD WITHOUT CONTRAST TECHNIQUE: Multiplanar, multiecho pulse sequences of the brain and surrounding structures were obtained without intravenous contrast. COMPARISON:  Head CT and CTA earlier today.  Head MRI 01/22/2021. FINDINGS: Brain: There is a moderate-sized acute right MCA infarct which corresponds relatively well to the visible infarct on today's earlier CT and is mildly less than the Tmax>6s volume on CTP. There is greatest involvement of the parietal lobe and insula with at the basal ganglia and frontal lobe involvement as well. There is corresponding T2 FLAIR hyperintensity/cytotoxic edema without significant mass effect or hemorrhage. A small chronic infarct is again noted in the medial right occipital lobe. The ventricles are normal in  size. No mass, midline shift, or extra-axial fluid collection is identified. Vascular: Known right M1 occlusion from today's earlier CTA with abnormal signal in more distal MCA branch vessels. Skull and upper cervical spine: Chronically diminished bone marrow T1 signal intensity which is nonspecific but may be related to a history of anemia. Sinuses/Orbits: Unremarkable orbits. Paranasal sinuses and mastoid air cells are clear. Other: None. IMPRESSION: Moderate-sized acute right MCA infarct. No significant  mass effect or hemorrhage. Electronically Signed   By: Dasie Hamburg M.D.   On: 12/16/2023 15:07   CT VENOGRAM HEAD Result Date: 12/16/2023 CLINICAL DATA:  Dural venous sinus thrombosis suspected. Headache, dizziness, and imbalance. History of stroke. EXAM: CT VENOGRAM HEAD TECHNIQUE: Venographic phase images of the brain were obtained following the administration of intravenous contrast. Multiplanar reformats and maximum intensity projections were generated. RADIATION DOSE REDUCTION: This exam was performed according to the departmental dose-optimization program which includes automated exposure control, adjustment of the mA and/or kV according to patient size and/or use of iterative reconstruction technique. CONTRAST:  OMNIPAQUE  IOHEXOL  350 MG/ML SOLN COMPARISON:  CT venogram of the head 01/22/2021 FINDINGS: The superior sagittal sinus, internal cerebral veins, vein of Galen, straight sinus, transverse sinuses, sigmoid sinuses, and jugular bulbs are patent without evidence of thrombus or significant stenosis. IMPRESSION: Negative CT venogram. Electronically Signed   By: Dasie Hamburg M.D.   On: 12/16/2023 11:11   CT ANGIO HEAD NECK W WO CM W PERF (CODE STROKE) Result Date: 12/16/2023 CLINICAL DATA:  Headache, dizziness, and imbalance. History of stroke. EXAM: CT ANGIOGRAPHY HEAD AND NECK CT PERFUSION BRAIN TECHNIQUE: Multidetector CT imaging of the head and neck was performed using the standard  protocol during bolus administration of intravenous contrast. Multiplanar CT image reconstructions and MIPs were obtained to evaluate the vascular anatomy. Carotid stenosis measurements (when applicable) are obtained utilizing NASCET criteria, using the distal internal carotid diameter as the denominator. Multiphase CT imaging of the brain was performed following IV bolus contrast injection. Subsequent parametric perfusion maps were calculated using RAPID software. RADIATION DOSE REDUCTION: This exam was performed according to the departmental dose-optimization program which includes automated exposure control, adjustment of the mA and/or kV according to patient size and/or use of iterative reconstruction technique. CONTRAST:  OMNIPAQUE  IOHEXOL  350 MG/ML SOLN COMPARISON:  CTA head and neck 01/22/2021 FINDINGS: CTA NECK FINDINGS Aortic arch: Standard branching. Widely patent brachiocephalic and subclavian arteries. Right carotid system: Patent without evidence of stenosis or dissection. Partially retropharyngeal course of the proximal ICA. Left carotid system: Patent without evidence of stenosis or dissection. Partially retropharyngeal course of the proximal ICA. Vertebral arteries: Patent and codominant without evidence of stenosis or dissection. Skeleton: No acute osseous abnormality or suspicious lesion. Other neck: Borderline enlarged bilateral cervical lymph nodes, similar to the 2022 CT and nonspecific. No neck mass. Upper chest: Clear lung apices. Review of the MIP images confirms the above findings CTA HEAD FINDINGS Anterior circulation: The internal carotid arteries are widely patent from skull base to carotid termini. There is a new distal right M1 occlusion with moderate distal collateralization. The left MCA and both ACAs are patent without evidence of a proximal branch occlusion or significant proximal stenosis. No aneurysm is identified. Posterior circulation: The intracranial vertebral arteries  are widely patent to the basilar. Patent right PICA, left AICA, and bilateral SCA origins are visualized. The basilar artery is widely patent. There are small right and large left posterior communicating arteries with hypoplasia of the left P1 segment. Both PCAs are patent without evidence of a significant proximal stenosis. No aneurysm is identified. Venous sinuses: More fully evaluated on the separately reported contemporaneous CT venogram. Anatomic variants: Fetal left PCA. Review of the MIP images confirms the above findings CT Brain Perfusion Findings: ASPECTS: 6 CBF (<30%) Volume: 0 mL Perfusion (Tmax>6.0s) volume: 42 mL Mismatch Volume: The acute core infarct apparent on the preceding noncontrast head CT was not detected by automated rapid  processing. Actual mismatch volume/penumbra is estimated to be 20-30 mL. Infarction Location: Right MCA (parietal lobe and insula). Findings were discussed by telephone with Dr. Jerrie on 12/16/2023 at 10:25 a.m. IMPRESSION: 1. Distal right M1 occlusion. 2. Estimated mismatch volume/ischemic penumbra on CT perfusion of 20-30 mL. 3. Widely patent carotid and vertebral arteries. Electronically Signed   By: Dasie Hamburg M.D.   On: 12/16/2023 11:09   CT HEAD CODE STROKE WO CONTRAST Result Date: 12/16/2023 CLINICAL DATA:  Code stroke. Neuro deficit, acute, stroke suspected. Headache, dizziness, and imbalance. EXAM: CT HEAD WITHOUT CONTRAST TECHNIQUE: Contiguous axial images were obtained from the base of the skull through the vertex without intravenous contrast. RADIATION DOSE REDUCTION: This exam was performed according to the departmental dose-optimization program which includes automated exposure control, adjustment of the mA and/or kV according to patient size and/or use of iterative reconstruction technique. COMPARISON:  Head MRI 01/22/2021 FINDINGS: Brain: Cortical and subcortical hypodensity laterally in the right cerebral hemisphere is consistent with an acute MCA  infarct and involves the insula, posterior putamen, and parietal lobe. No acute intracranial hemorrhage, mass, midline shift, or extra-axial fluid collection is identified. A chronic infarct in the medial right occipital lobe is unchanged. The ventricles are normal in size. Vascular: Hyperdense right MCA in the sylvian fissure. Skull: No fracture or suspicious lesion. Sinuses/Orbits: Visualized paranasal sinuses and mastoid air cells are clear. Unremarkable included orbits. Other: None. ASPECTS [facility] Stroke Program Early CT Score) - Ganglionic level infarction (caudate, lentiform nuclei, internal capsule, insula, M1-M3 cortex): 4 - Supraganglionic infarction (M4-M6 cortex): 2 Total score (0-10 with 10 being normal): 6 These results were communicated to Dr. Jerrie at 9:51 am on 12/16/2023 by text page via the [facility] messaging system. IMPRESSION: 1. Acute right MCA infarct with hyperdense right MCA.  ASPECTS of 6. 2. No intracranial hemorrhage. 3. Chronic right occipital infarct. Electronically Signed   By: Dasie Hamburg M.D.   On: 12/16/2023 09:52       HISTORY OF PRESENT ILLNESS 33 y.o. patient with history of obesity, diabetes, hypertension, anxiety/panic attack, migraine, OSA, stroke admitted for headache, dizziness and imbalance. CT showed acute right MCA infarct with hyperdense right MCA, chronic right occipital infarct. CTA head and neck showed distal right M1 occlusion and relatively reserved right MCA collateral flow. CTP 0/42 cc. CTV neg. MRI pending. She had R PCA stroke in 04/2014, cryptogenic, and TIA in 01/2015. Initial concern of protein S deficiency but repeat normal level per report. Pt mild symptoms with questionable chronic MCA occlusion, not candidate for IR for now. 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 ASPECTS of 6  CTA head & neck Distal  right M1 occlusion.  CTP 0/42 cc MRI  Moderate-sized acute right MCA infarct. No significant mass effect or hemorrhage. DVT Negative TEE 7/25- Small patent foreman ovale, likely located anteriorly, more pronounced with coughing and Valsalva maneuver.   TCD Bubble study- negative for PFO 2D Echo EF 60-65%  LDL 91 HgbA1c 5.6 Hypercoagulable work up negative so far ANA neg UDS pending VTE prophylaxis - Lovenox  No antithrombotic prior to admission, now on aspirin  81 mg daily and clopidogrel  75 mg daily for 3 months and then ASA alone Therapy recommendations: none Disposition:  home today   Hx of Stroke/TIA In 04/2014, she was admitted for right PCA infarct with right P3 occlusion.  TCD bubble study no PFO, cerebral angiogram no evidence of vasculitis. CSF showed WBC 2, protein 15, not consistent with vasculitis. TEE unremarkable. Hypercoag workup only showed low protein S activity. Patient was taken OCP prior to the stroke. 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 Hematology follow-up recommended. OCP was discontinued. She was discharged on aspirin  325 and Lipitor 20. Followed up with hematology Dr. 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 Ines at [facility]   Hypertension Home meds:  Lisinopril  Stable Long term BP goal normotensive   Hyperlipidemia LDL 91, goal < 70 Add atorvastatin  20 No high intensity of statin due to no evidence of atherosclerosis and childbearing age Continue statin at discharge   B12 deficiency B12 = 168 On supplement Homocysteine level normal range   Other Stroke Risk Factors Obesity, Body mass index is 37.51 kg/m., BMI >/= 30 associated with increased stroke risk, recommend weight loss, diet and exercise as appropriate  Family history  of blood clots (mother PAD with thrombus and brother with PE) On mirena  IUD Migraine - follows with Dr. Ines at [facility]   Other medical issues Anxiety / panic attack   DISCHARGE EXAM  PHYSICAL EXAM General:  Alert, well-nourished, well-developed patient in no acute distress Psych:  Mood and affect appropriate for situation CV: Regular rate and rhythm on monitor Respiratory:  Regular, unlabored respirations on room air GI: Abdomen soft and nontender     NEURO:  Mental Status: AA&Ox3, patient is able to give clear and coherent history Speech/Language: speech is without dysarthria or aphasia.  Naming, repetition, fluency, and comprehension intact.   Cranial Nerves:  II: PERRL. Visual fields full. Visual neglect on the left III, IV, VI: EOMI. Eyelids elevate symmetrically.  V: Sensation is intact to light touch and symmetrical to face.  VII: Face is symmetrical resting and smiling VIII: hearing intact to voice. IX, X: Palate elevates symmetrically. Phonation is normal.  KP:Dynloizm shrug 5/5. XII: tongue is midline without fasciculations. Motor: 5/5 strength to all muscle groups tested.  Tone: is normal and bulk is normal Sensation- Intact to light touch bilaterally.  Sensory neglect improving Coordination: FTN intact bilaterally, HKS: no ataxia in BLE.No drift.  Gait- deferred   Most Recent NIH 2     Discharge Diet       Diet   Diet regular Room service appropriate? Yes; Fluid consistency: Thin   liquids  DISCHARGE PLAN Disposition: Home aspirin  81 mg daily and clopidogrel  75 mg daily for secondary stroke prevention for 3 weeks then aspirin  81 mg daily alone. Ongoing stroke risk factor control by Primary Care Physician at time of discharge Follow-up PCP Angel [name] in 2 weeks. Follow-up in [facility] Dr. Rosemarie in 4 weeks, office to schedule an appointment. Able to see NP in clinic.  40 minutes were spent preparing  discharge.  Patient seen and examined by NP/APP with [name]. [name] to update note as needed.   Jorene Last, DNP, FNP-BC [facility] Pager: [phone]  ATTENDING NOTE: I reviewed above note and agree with the assessment and plan. Pt was seen and examined.   Husband at the bedside. Pt lying in bed, no acute event overnight, neuro stable. TEE done today concerning for small PFO, but in 2013 TEE no PFO. Therefore  TCD bubble study done again showed no PFO, consistent with TCD bubble study 10 years ago. Hypercoagulable work up negative. B12 low on supplement. On DAPT and statin. Will follow up with Dr. Rosemarie at [facility].   For detailed assessment and plan, please refer to above as I have made changes wherever appropriate.   Ary Cummins, MD PhD Stroke Neurology 12/18/2023 11:59 PM

## 2023-12-18 NOTE — Plan of Care (Signed)
  Problem: Education: Goal: Knowledge of disease or condition will improve Outcome: Completed/Met Goal: Knowledge of secondary prevention will improve (MUST DOCUMENT ALL) Outcome: Completed/Met Goal: Knowledge of patient specific risk factors will improve (DELETE if not current risk factor) Outcome: Completed/Met   Problem: Ischemic Stroke/TIA Tissue Perfusion: Goal: Complications of ischemic stroke/TIA will be minimized Outcome: Completed/Met   Problem: Coping: Goal: Will verbalize positive feelings about self Outcome: Completed/Met Goal: Will identify appropriate support needs Outcome: Completed/Met   Problem: Health Behavior/Discharge Planning: Goal: Ability to manage health-related needs will improve Outcome: Completed/Met Goal: Goals will be collaboratively established with patient/family Outcome: Completed/Met   Problem: Self-Care: Goal: Ability to participate in self-care as condition permits will improve Outcome: Completed/Met Goal: Verbalization of feelings and concerns over difficulty with self-care will improve Outcome: Completed/Met Goal: Ability to communicate needs accurately will improve Outcome: Completed/Met   Problem: Nutrition: Goal: Risk of aspiration will decrease Outcome: Completed/Met Goal: Dietary intake will improve Outcome: Completed/Met   Problem: Education: Goal: Knowledge of General Education information will improve Description: Including pain rating scale, medication(s)/side effects and non-pharmacologic comfort measures Outcome: Completed/Met   Problem: Health Behavior/Discharge Planning: Goal: Ability to manage health-related needs will improve Outcome: Completed/Met   Problem: Clinical Measurements: Goal: Ability to maintain clinical measurements within normal limits will improve Outcome: Completed/Met Goal: Will remain free from infection Outcome: Completed/Met Goal: Diagnostic test results will improve Outcome:  Completed/Met Goal: Respiratory complications will improve Outcome: Completed/Met Goal: Cardiovascular complication will be avoided Outcome: Completed/Met   Problem: Activity: Goal: Risk for activity intolerance will decrease Outcome: Completed/Met   Problem: Nutrition: Goal: Adequate nutrition will be maintained Outcome: Completed/Met   Problem: Coping: Goal: Level of anxiety will decrease Outcome: Completed/Met   Problem: Elimination: Goal: Will not experience complications related to bowel motility Outcome: Completed/Met Goal: Will not experience complications related to urinary retention Outcome: Completed/Met   Problem: Pain Managment: Goal: General experience of comfort will improve and/or be controlled Outcome: Completed/Met   Problem: Safety: Goal: Ability to remain free from injury will improve Outcome: Completed/Met   Problem: Skin Integrity: Goal: Risk for impaired skin integrity will decrease Outcome: Completed/Met   Problem: SLP Cognition Goals Goal: Patient will utilize external memory aids Description: Patient will utilize external memory aids to facilitate recall of information for improved safety with Outcome: Completed/Met Goal: Patient will demonstrate problem solving skills Description: Patient will demonstrate problem solving skills during functional ADL's with Outcome: Completed/Met

## 2023-12-20 ENCOUNTER — Telehealth: Admitting: Physician Assistant

## 2023-12-20 DIAGNOSIS — G44209 Tension-type headache, unspecified, not intractable: Secondary | ICD-10-CM

## 2023-12-20 DIAGNOSIS — F411 Generalized anxiety disorder: Secondary | ICD-10-CM | POA: Diagnosis not present

## 2023-12-20 NOTE — Patient Instructions (Signed)
  Angel [name], thank you for joining [name], PA-C for today's virtual visit.  While this provider is not your primary care provider (PCP), if your PCP is located in our provider database this encounter information will be shared with them immediately following your visit.   A [facility] MyChart account gives you access to today's visit and all your visits, tests, and labs performed at [facility]  click here if you don't have a [facility] MyChart account or go to [email or web address]  Consent: (Patient) Angel [name] provided verbal consent for this virtual visit at the beginning of the encounter.  Current Medications:  Current Outpatient Medications:    ALPRAZolam  (XANAX ) 1 MG tablet, Take 1 mg by mouth daily as needed for anxiety or sleep., Disp: , Rfl:    aspirin  EC 81 MG tablet, Take 1 tablet (81 mg total) by mouth daily. Swallow whole., Disp: 30 tablet, Rfl: 12   atorvastatin  (LIPITOR) 20 MG tablet, Take 1 tablet (20 mg total) by mouth daily., Disp: 30 tablet, Rfl: 1   clopidogrel  (PLAVIX ) 75 MG tablet, Take 1 tablet (75 mg total) by mouth daily., Disp: 30 tablet, Rfl: 1   [START ON 12/24/2023] cyanocobalamin  1000 MCG tablet, Take 1 tablet (1,000 mcg total) by mouth daily., Disp: 30 tablet, Rfl: 1   cyclobenzaprine  (FLEXERIL ) 10 MG tablet, Take 1 tablet (10 mg total) by mouth 3 (three) times daily as needed for muscle spasms. (Patient not taking: Reported on 12/16/2023), Disp: 30 tablet, Rfl: 0   fluticasone  (FLONASE ) 50 MCG/ACT nasal spray, Place 2 sprays into both nostrils daily. (Patient not taking: Reported on 12/16/2023), Disp: 16 g, Rfl: 6   furosemide  (LASIX ) 20 MG tablet, Take 1 tablet (20 mg total) by mouth daily., Disp: 5 tablet, Rfl: 0   lisinopril (ZESTRIL) 5 MG tablet, Take 5 mg by mouth daily., Disp: , Rfl:    metFORMIN  (GLUCOPHAGE ) 500 MG tablet, Take 500 mg by mouth 2 (two) times daily with a meal., Disp: , Rfl:    Medications  ordered in this encounter:  No orders of the defined types were placed in this encounter.    *If you need refills on other medications prior to your next appointment, please contact your pharmacy*  Follow-Up: Call back or seek an in-person evaluation if the symptoms worsen or if the condition fails to improve as anticipated.  [facility] Virtual Care [phone]  Other Instructions Report to ER for evaluation.   If you have been instructed to have an in-person evaluation today at a local Urgent Care facility, please use the link below. It will take you to a list of all of our available [facility], including address, phone number and hours of operation. Please do not delay care.  [facility]  If you or a family member do not have a primary care provider, use the link below to schedule a visit and establish care. When you choose a [facility] primary care physician or advanced practice provider, you gain a long-term partner in health. Find a Primary Care Provider  Learn more about [facility] in-office and virtual care options:  - Get Care Now

## 2023-12-20 NOTE — Progress Notes (Signed)
 Virtual Visit Consent   Angel [name], you are scheduled for a virtual visit with a [facility] provider today. Just as with appointments in the office, your consent must be obtained to participate. Your consent will be active for this visit and any virtual visit you may have with one of our providers in the next 365 days. If you have a MyChart account, a copy of this consent can be sent to you electronically.  As this is a virtual visit, video technology does not allow for your provider to perform a traditional examination. This may limit your provider's ability to fully assess your condition. If your provider identifies any concerns that need to be evaluated in person or the need to arrange testing (such as labs, EKG, etc.), we will make arrangements to do so. Although advances in technology are sophisticated, we cannot ensure that it will always work on either your end or our end. If the connection with a video visit is poor, the visit may have to be switched to a telephone visit. With either a video or telephone visit, we are not always able to ensure that we have a secure connection.  By engaging in this virtual visit, you consent to the provision of healthcare and authorize for your insurance to be billed (if applicable) for the services provided during this visit. Depending on your insurance coverage, you may receive a charge related to this service.  I need to obtain your verbal consent now. Are you willing to proceed with your visit today? [name] has provided verbal consent on 12/20/2023 for a virtual visit (video or telephone). Angel [name], [place]  Date: 12/20/2023 12:12 PM   Virtual Visit via Video Note   I, Angel [name], connected with  Shawnna Pancake  ([id number], [birth year]) on 12/20/23 at 12:45 PM EDT by a video-enabled telemedicine application and verified that I am speaking with the correct person using two identifiers.  Location: Patient: Virtual Visit  Location Patient: Home Provider: Virtual Visit Location Provider: Home Office   I discussed the limitations of evaluation and management by telemedicine and the availability of in person appointments. The patient expressed understanding and agreed to proceed.    History of Present Illness: Angel [name] is a 33 y.o. who identifies as a female who was assigned female at birth, and is being seen today for headache.  HPI: Headache  This is a new problem. The current episode started today. The problem occurs constantly. The problem has been unchanged. The pain is located in the Temporal region. The pain quality is not similar to prior headaches. The quality of the pain is described as aching. The pain is at a severity of 4/10. The pain is mild. Nothing aggravates the symptoms. She has tried acetaminophen  for the symptoms. The treatment provided mild relief.    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   Unwanted fertility    GDM (gestational diabetes mellitus) 03/14/2022   Gestational diabetes    BMI 45.0-49.9, adult (HCC) 11/14/2021   Prediabetes 10/17/2021   Supervision of high risk pregnancy, antepartum 08/27/2021   Protein S deficiency affecting pregnancy, antepartum (HCC) 09/12/2019   History of pre-eclampsia in prior pregnancy, currently pregnant 09/12/2019   Maternal morbid obesity, antepartum (HCC) 09/12/2019   Vitamin D deficiency 02/12/2015   Panic attacks 02/09/2015   Anxiety state 02/09/2015   Frequent headaches 02/09/2015   Protein S deficiency (HCC)  red chart,History of ischemic stroke x 2 in 2015 05/13/2014   Cerebral infarction (HCC) 05/13/2014   Anemia    Migraine 03/13/2014   Morbid obesity (HCC) 03/13/2014    Allergies: No Known Allergies Medications:  Current Outpatient Medications:    ALPRAZolam  (XANAX ) 1 MG tablet, Take 1 mg by mouth daily as needed for anxiety or sleep., Disp: ,  Rfl:    aspirin  EC 81 MG tablet, Take 1 tablet (81 mg total) by mouth daily. Swallow whole., Disp: 30 tablet, Rfl: 12   atorvastatin  (LIPITOR) 20 MG tablet, Take 1 tablet (20 mg total) by mouth daily., Disp: 30 tablet, Rfl: 1   clopidogrel  (PLAVIX ) 75 MG tablet, Take 1 tablet (75 mg total) by mouth daily., Disp: 30 tablet, Rfl: 1   [START ON 12/24/2023] cyanocobalamin  1000 MCG tablet, Take 1 tablet (1,000 mcg total) by mouth daily., Disp: 30 tablet, Rfl: 1   cyclobenzaprine  (FLEXERIL ) 10 MG tablet, Take 1 tablet (10 mg total) by mouth 3 (three) times daily as needed for muscle spasms. (Patient not taking: Reported on 12/16/2023), Disp: 30 tablet, Rfl: 0   fluticasone  (FLONASE ) 50 MCG/ACT nasal spray, Place 2 sprays into both nostrils daily. (Patient not taking: Reported on 12/16/2023), Disp: 16 g, Rfl: 6   furosemide  (LASIX ) 20 MG tablet, Take 1 tablet (20 mg total) by mouth daily., Disp: 5 tablet, Rfl: 0   lisinopril (ZESTRIL) 5 MG tablet, Take 5 mg by mouth daily., Disp: , Rfl:    metFORMIN  (GLUCOPHAGE ) 500 MG tablet, Take 500 mg by mouth 2 (two) times daily with a meal., Disp: , Rfl:   Observations/Objective: Patient is well-developed, well-nourished in no acute distress.  Resting comfortably  at home.  Head is normocephalic, atraumatic.  No labored breathing.  Speech is clear and coherent with logical content.  Patient is alert and oriented at baseline.    Assessment and Plan: 1. Tension-type headache, not intractable, unspecified chronicity pattern (Primary)  Patient diagnosed with CVA on 05/23. Ongoing headache since that time. Advised patient to present to ER immediately as this could be an ongoing infarct and would require additional diagnostics/labs. Patient is in agreement with this plan and is reporting to the ER for evaluation. No slurred speech facial droop or vision abnormalities noted.   Follow Up Instructions: I discussed the assessment and treatment plan with the patient. The  patient was provided an opportunity to ask questions and all were answered. The patient agreed with the plan and demonstrated an understanding of the instructions.  A copy of instructions were sent to the patient via MyChart unless otherwise noted below.    The patient was advised to call back or seek an in-person evaluation if the symptoms worsen or if the condition fails to improve as anticipated.    Angel Shuck, PA-C

## 2023-12-21 LAB — PROTHROMBIN GENE MUTATION

## 2023-12-21 NOTE — Anesthesia Postprocedure Evaluation (Signed)
 Anesthesia Post Note  Patient: Angel [name]  Procedure(s) Performed: TRANSESOPHAGEAL ECHOCARDIOGRAM     Patient location during evaluation: Cath Lab Anesthesia Type: MAC Level of consciousness: awake and alert Pain management: pain level controlled Vital Signs Assessment: post-procedure vital signs reviewed and stable Respiratory status: spontaneous breathing, nonlabored ventilation, respiratory function stable and patient connected to nasal cannula oxygen Cardiovascular status: stable and blood pressure returned to baseline Postop Assessment: no apparent nausea or vomiting Anesthetic complications: no   No notable events documented.  Last Vitals:  Vitals:   12/18/23 0946 12/18/23 1213  BP: 116/77 131/81  Pulse: 73 72  Resp: 18 18  Temp: 36.8 C 36.4 C  SpO2: 100% 100%    Last Pain:  Vitals:   12/18/23 1213  TempSrc: Oral  PainSc:                  Annaleigh Steinmeyer S

## 2023-12-24 DIAGNOSIS — Z8673 Personal history of transient ischemic attack (TIA), and cerebral infarction without residual deficits: Secondary | ICD-10-CM | POA: Diagnosis not present

## 2023-12-24 DIAGNOSIS — E119 Type 2 diabetes mellitus without complications: Secondary | ICD-10-CM | POA: Diagnosis not present

## 2023-12-24 DIAGNOSIS — E782 Mixed hyperlipidemia: Secondary | ICD-10-CM | POA: Diagnosis not present

## 2023-12-24 DIAGNOSIS — E663 Overweight: Secondary | ICD-10-CM | POA: Diagnosis not present

## 2023-12-27 DIAGNOSIS — F411 Generalized anxiety disorder: Secondary | ICD-10-CM | POA: Diagnosis not present

## 2024-01-02 ENCOUNTER — Telehealth: Admitting: Nurse Practitioner

## 2024-01-02 DIAGNOSIS — R112 Nausea with vomiting, unspecified: Secondary | ICD-10-CM | POA: Diagnosis not present

## 2024-01-02 MED ORDER — ONDANSETRON HCL 4 MG PO TABS: 4.0000 mg | ORAL_TABLET | Freq: Three times a day (TID) | ORAL | 0 refills | Status: AC | PRN

## 2024-01-02 NOTE — Progress Notes (Signed)
 I have spent 5 minutes in review of e-visit questionnaire, review and updating patient chart, medical decision making and response to patient.   Claiborne Rigg, NP

## 2024-01-02 NOTE — Progress Notes (Signed)

## 2024-01-03 DIAGNOSIS — F411 Generalized anxiety disorder: Secondary | ICD-10-CM | POA: Diagnosis not present

## 2024-01-10 DIAGNOSIS — F411 Generalized anxiety disorder: Secondary | ICD-10-CM | POA: Diagnosis not present

## 2024-01-12 ENCOUNTER — Other Ambulatory Visit: Payer: Self-pay | Admitting: Nurse Practitioner

## 2024-01-12 DIAGNOSIS — R112 Nausea with vomiting, unspecified: Secondary | ICD-10-CM

## 2024-01-17 DIAGNOSIS — F411 Generalized anxiety disorder: Secondary | ICD-10-CM | POA: Diagnosis not present

## 2024-01-23 ENCOUNTER — Telehealth: Admitting: Physician Assistant

## 2024-01-23 DIAGNOSIS — G4452 New daily persistent headache (NDPH): Secondary | ICD-10-CM

## 2024-01-23 NOTE — Progress Notes (Signed)
 Message sent to patient requesting further input regarding current symptoms. Awaiting patient response.

## 2024-01-23 NOTE — Progress Notes (Signed)
  Because of new/persistent headaches with recent history of stroke, and need for exam, I feel your condition warrants further evaluation and I recommend that you be seen in a face-to-face visit.   NOTE: There will be NO CHARGE for this E-Visit   If you are having a true medical emergency, please call 911.     For an urgent face to face visit, [facility] has multiple urgent care centers for your convenience.  Click the link below for the full list of locations and hours, walk-in wait times, appointment scheduling options and driving directions:  Urgent Care - [place], [place], [place], [place], [place], [place]  [place]     Your MyChart E-visit questionnaire answers were reviewed by a board certified advanced clinical practitioner to complete your personal care plan based on your specific symptoms.    Thank you for using e-Visits.

## 2024-01-24 DIAGNOSIS — F411 Generalized anxiety disorder: Secondary | ICD-10-CM | POA: Diagnosis not present

## 2024-01-31 DIAGNOSIS — F411 Generalized anxiety disorder: Secondary | ICD-10-CM | POA: Diagnosis not present

## 2024-02-03 ENCOUNTER — Encounter: Payer: Self-pay | Admitting: Obstetrics & Gynecology

## 2024-02-03 ENCOUNTER — Ambulatory Visit: Admitting: Obstetrics & Gynecology

## 2024-02-07 DIAGNOSIS — F411 Generalized anxiety disorder: Secondary | ICD-10-CM | POA: Diagnosis not present

## 2024-02-09 DIAGNOSIS — F411 Generalized anxiety disorder: Secondary | ICD-10-CM | POA: Diagnosis not present

## 2024-02-17 ENCOUNTER — Telehealth: Admitting: Physician Assistant

## 2024-02-17 DIAGNOSIS — Z760 Encounter for issue of repeat prescription: Secondary | ICD-10-CM

## 2024-02-17 DIAGNOSIS — Z5321 Procedure and treatment not carried out due to patient leaving prior to being seen by health care provider: Secondary | ICD-10-CM

## 2024-02-17 NOTE — Progress Notes (Signed)
 Patient arrived prior to scheduled appointment time and left before provider logged on. Multiple links were sent over a long period of time from when the patient originally logged in and the patient made no further attempts to rejoin. Provider then awaited the allotted 10 minutes at appointment time, sent more links and patient never rejoined.   Mark NO charge.

## 2024-02-17 NOTE — Progress Notes (Signed)
   Thank you for the details you included in the comment boxes. Those details are very helpful in determining the best course of treatment for you and help us  to provide the best care. Because of needing a chronic medication refill, we recommend that you schedule a Virtual Urgent Care video visit in order for the provider to better assess what is going on.  The provider will be able to give you a more accurate diagnosis and treatment plan if we can more freely discuss your symptoms and with the addition of a virtual examination.   If you change your visit to a video visit, we will bill your insurance (similar to an office visit) and you will not be charged for this e-Visit. You will be able to stay at home and speak with the first available [facility] advanced practice provider. The link to do a video visit is in the drop down Menu tab of your Welcome screen in MyChart.         I have spent 5 minutes in review of e-visit questionnaire, review and updating patient chart, medical decision making and response to patient.   Delon CHRISTELLA Dickinson, PA-C

## 2024-03-10 ENCOUNTER — Encounter: Payer: Self-pay | Admitting: Neurology

## 2024-03-10 ENCOUNTER — Inpatient Hospital Stay: Admitting: Neurology

## 2024-03-22 ENCOUNTER — Telehealth: Admitting: Family Medicine

## 2024-03-22 DIAGNOSIS — N760 Acute vaginitis: Secondary | ICD-10-CM | POA: Diagnosis not present

## 2024-03-22 DIAGNOSIS — B9689 Other specified bacterial agents as the cause of diseases classified elsewhere: Secondary | ICD-10-CM

## 2024-03-22 MED ORDER — METRONIDAZOLE 500 MG PO TABS: 500.0000 mg | ORAL_TABLET | Freq: Two times a day (BID) | ORAL | 0 refills | Status: AC

## 2024-03-22 NOTE — Progress Notes (Signed)
 We are sorry that you are not feeling well. Here is how we plan to help! Based on what you shared with me it looks like you: May have a vaginosis due to bacteria  Vaginosis is an inflammation of the vagina that can result in discharge, itching and pain. The cause is usually a change in the normal balance of vaginal bacteria or an infection. Vaginosis can also result from reduced estrogen levels after menopause.  The most common causes of vaginosis are:   Bacterial vaginosis which results from an overgrowth of one on several organisms that are normally present in your vagina.   Yeast infections which are caused by a naturally occurring fungus called candida.   Vaginal atrophy (atrophic vaginosis) which results from the thinning of the vagina from reduced estrogen levels after menopause.   Trichomoniasis which is caused by a parasite and is commonly transmitted by sexual intercourse.  Factors that increase your risk of developing vaginosis include: Medications, such as antibiotics and steroids Uncontrolled diabetes Use of hygiene products such as bubble bath, vaginal spray or vaginal deodorant Douching Wearing damp or tight-fitting clothing Using an intrauterine device (IUD) for birth control Hormonal changes, such as those associated with pregnancy, birth control pills or menopause Sexual activity Having a sexually transmitted infection  Your treatment plan is Metronidazole  or Flagyl  500mg  twice a day for 7 days.  I have electronically sent this prescription into the pharmacy that you have chosen.  Be sure to take all of the medication as directed. Stop taking any medication if you develop a rash, tongue swelling or shortness of breath. Mothers who are breast feeding should consider pumping and discarding their breast milk while on these antibiotics. However, there is no consensus that infant exposure at these doses would be harmful.  Remember that medication creams can weaken latex  condoms.   HOME CARE:  Good hygiene may prevent some types of vaginosis from recurring and may relieve some symptoms:  Avoid baths, hot tubs and whirlpool spas. Rinse soap from your outer genital area after a shower, and dry the area well to prevent irritation. Don't use scented or harsh soaps, such as those with deodorant or antibacterial action. Avoid irritants. These include scented tampons and pads. Wipe from front to back after using the toilet. Doing so avoids spreading fecal bacteria to your vagina.  Other things that may help prevent vaginosis include:  Don't douche. Your vagina doesn't require cleansing other than normal bathing. Repetitive douching disrupts the normal organisms that reside in the vagina and can actually increase your risk of vaginal infection. Douching won't clear up a vaginal infection. Use a latex condom. Both female and female latex condoms may help you avoid infections spread by sexual contact. Wear cotton underwear. Also wear pantyhose with a cotton crotch. If you feel comfortable without it, skip wearing underwear to bed. Yeast thrives in [place] Your symptoms should improve in the next day or two.  GET HELP RIGHT AWAY IF:  You have pain in your lower abdomen ( pelvic area or over your ovaries) You develop nausea or vomiting You develop a fever Your discharge changes or worsens You have persistent pain with intercourse You develop shortness of breath, a rapid pulse, or you faint.  These symptoms could be signs of problems or infections that need to be evaluated by a medical provider now.  MAKE SURE YOU   Understand these instructions. Will watch your condition. Will get help right away if you are not doing  well or get worse.  Your e-visit answers were reviewed by a board certified advanced clinical practitioner to complete your personal care plan. Depending upon the condition, your plan could have included both over the counter or  prescription medications. Please review your pharmacy choice to make sure that you have choses a pharmacy that is open for you to pick up any needed prescription, Your safety is important to us . If you have drug allergies check your prescription carefully.   You can use MyChart to ask questions about today's visit, request a non-urgent call back, or ask for a work or school excuse for 24 hours related to this e-Visit. If it has been greater than 24 hours you will need to follow up with your provider, or enter a new e-Visit to address those concerns. You will get a MyChart message within the next two days asking about your experience. I hope that your e-visit has been valuable and will speed your recovery.  I have spent 5 minutes in review of e-visit questionnaire, review and updating patient chart, medical decision making and response to patient.   Chiquita CHRISTELLA Barefoot, NP

## 2024-04-18 ENCOUNTER — Encounter: Payer: Self-pay | Admitting: Neurology

## 2024-04-18 ENCOUNTER — Ambulatory Visit: Admitting: Neurology

## 2024-04-18 ENCOUNTER — Ambulatory Visit (HOSPITAL_COMMUNITY): Admitting: Clinical

## 2024-04-19 ENCOUNTER — Ambulatory Visit (HOSPITAL_COMMUNITY): Admitting: Clinical

## 2024-04-29 ENCOUNTER — Telehealth: Admitting: Family Medicine

## 2024-04-29 DIAGNOSIS — J029 Acute pharyngitis, unspecified: Secondary | ICD-10-CM | POA: Diagnosis not present

## 2024-04-29 DIAGNOSIS — B9689 Other specified bacterial agents as the cause of diseases classified elsewhere: Secondary | ICD-10-CM | POA: Diagnosis not present

## 2024-04-29 DIAGNOSIS — J019 Acute sinusitis, unspecified: Secondary | ICD-10-CM | POA: Diagnosis not present

## 2024-04-29 MED ORDER — AMOXICILLIN-POT CLAVULANATE 875-125 MG PO TABS: 1.0000 | ORAL_TABLET | Freq: Two times a day (BID) | ORAL | 0 refills | Status: AC

## 2024-04-29 NOTE — Progress Notes (Signed)

## 2024-06-06 ENCOUNTER — Ambulatory Visit (HOSPITAL_COMMUNITY): Admitting: Clinical

## 2024-06-13 ENCOUNTER — Telehealth: Admitting: Physician Assistant

## 2024-06-13 DIAGNOSIS — R3989 Other symptoms and signs involving the genitourinary system: Secondary | ICD-10-CM | POA: Diagnosis not present

## 2024-06-13 MED ORDER — NITROFURANTOIN MONOHYD MACRO 100 MG PO CAPS: 100.0000 mg | ORAL_CAPSULE | Freq: Two times a day (BID) | ORAL | 0 refills | Status: AC

## 2024-06-13 NOTE — Progress Notes (Signed)

## 2024-06-23 ENCOUNTER — Encounter (HOSPITAL_COMMUNITY): Payer: Self-pay

## 2024-06-28 ENCOUNTER — Ambulatory Visit (INDEPENDENT_AMBULATORY_CARE_PROVIDER_SITE_OTHER): Admitting: Clinical

## 2024-06-28 DIAGNOSIS — F331 Major depressive disorder, recurrent, moderate: Secondary | ICD-10-CM
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
## 2024-07-12 ENCOUNTER — Ambulatory Visit (HOSPITAL_COMMUNITY): Admitting: Clinical
